# Patient Record
Sex: Male | Born: 1980 | Race: White | Hispanic: No | Marital: Single | Smoking: Current every day smoker
Health system: Southern US, Community
[De-identification: ages and names within clinical notes are randomized; demographics above are authoritative.]

## PROBLEM LIST (undated history)

## (undated) DIAGNOSIS — F909 Attention-deficit hyperactivity disorder, unspecified type: Secondary | ICD-10-CM

## (undated) HISTORY — PX: OTHER SURGICAL HISTORY: SHX169

---
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
## 2002-04-01 ENCOUNTER — Inpatient Hospital Stay (HOSPITAL_COMMUNITY): Admission: EM | Admit: 2002-04-01 | Discharge: 2002-04-02 | Payer: Self-pay | Admitting: Emergency Medicine

## 2002-04-02 ENCOUNTER — Inpatient Hospital Stay (HOSPITAL_COMMUNITY): Admission: EM | Admit: 2002-04-02 | Discharge: 2002-04-06 | Payer: Self-pay | Admitting: *Deleted

## 2002-06-25 ENCOUNTER — Emergency Department (HOSPITAL_COMMUNITY): Admission: EM | Admit: 2002-06-25 | Discharge: 2002-06-25 | Payer: Self-pay | Admitting: Emergency Medicine

## 2002-06-25 ENCOUNTER — Inpatient Hospital Stay (HOSPITAL_COMMUNITY): Admission: EM | Admit: 2002-06-25 | Discharge: 2002-06-29 | Payer: Self-pay | Admitting: Psychiatry

## 2003-05-10 ENCOUNTER — Emergency Department (HOSPITAL_COMMUNITY): Admission: EM | Admit: 2003-05-10 | Discharge: 2003-05-10 | Payer: Self-pay | Admitting: Emergency Medicine

## 2004-07-04 ENCOUNTER — Emergency Department (HOSPITAL_COMMUNITY): Admission: EM | Admit: 2004-07-04 | Discharge: 2004-07-04 | Payer: Self-pay | Admitting: Emergency Medicine

## 2005-04-04 ENCOUNTER — Emergency Department (HOSPITAL_COMMUNITY): Admission: EM | Admit: 2005-04-04 | Discharge: 2005-04-04 | Payer: Self-pay | Admitting: Emergency Medicine

## 2006-05-25 ENCOUNTER — Emergency Department (HOSPITAL_COMMUNITY): Admission: EM | Admit: 2006-05-25 | Discharge: 2006-05-25 | Payer: Self-pay | Admitting: Family Medicine

## 2006-05-31 ENCOUNTER — Emergency Department (HOSPITAL_COMMUNITY): Admission: EM | Admit: 2006-05-31 | Discharge: 2006-06-01 | Payer: Self-pay | Admitting: Emergency Medicine

## 2006-08-24 ENCOUNTER — Emergency Department (HOSPITAL_COMMUNITY): Admission: EM | Admit: 2006-08-24 | Discharge: 2006-08-24 | Payer: Self-pay | Admitting: Emergency Medicine

## 2007-12-25 ENCOUNTER — Emergency Department (HOSPITAL_COMMUNITY): Admission: EM | Admit: 2007-12-25 | Discharge: 2007-12-25 | Payer: Self-pay | Admitting: Emergency Medicine

## 2010-11-10 ENCOUNTER — Emergency Department (HOSPITAL_COMMUNITY): Admission: EM | Admit: 2010-11-10 | Discharge: 2010-11-10 | Payer: Self-pay | Admitting: Emergency Medicine

## 2010-11-22 ENCOUNTER — Emergency Department (HOSPITAL_COMMUNITY)
Admission: EM | Admit: 2010-11-22 | Discharge: 2010-11-22 | Payer: Self-pay | Source: Home / Self Care | Admitting: Family Medicine

## 2011-02-24 ENCOUNTER — Emergency Department (HOSPITAL_COMMUNITY): Payer: Self-pay

## 2011-02-24 ENCOUNTER — Emergency Department (HOSPITAL_COMMUNITY)
Admission: EM | Admit: 2011-02-24 | Discharge: 2011-02-24 | Disposition: A | Discharge status: Home / Self Care | Payer: Medicaid Other | Attending: Emergency Medicine | Admitting: Emergency Medicine

## 2011-02-24 ENCOUNTER — Emergency Department (HOSPITAL_COMMUNITY): Payer: Medicaid Other

## 2011-02-24 DIAGNOSIS — J45909 Unspecified asthma, uncomplicated: Secondary | ICD-10-CM | POA: Insufficient documentation

## 2011-02-24 DIAGNOSIS — Z79899 Other long term (current) drug therapy: Secondary | ICD-10-CM | POA: Insufficient documentation

## 2011-02-24 DIAGNOSIS — M25569 Pain in unspecified knee: Secondary | ICD-10-CM | POA: Insufficient documentation

## 2011-02-24 DIAGNOSIS — Y92009 Unspecified place in unspecified non-institutional (private) residence as the place of occurrence of the external cause: Secondary | ICD-10-CM | POA: Insufficient documentation

## 2011-02-24 DIAGNOSIS — W010XXA Fall on same level from slipping, tripping and stumbling without subsequent striking against object, initial encounter: Secondary | ICD-10-CM | POA: Insufficient documentation

## 2011-02-24 DIAGNOSIS — S8000XA Contusion of unspecified knee, initial encounter: Secondary | ICD-10-CM | POA: Insufficient documentation

## 2011-02-24 DIAGNOSIS — F3130 Bipolar disorder, current episode depressed, mild or moderate severity, unspecified: Secondary | ICD-10-CM | POA: Insufficient documentation
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
## 2011-07-16 ENCOUNTER — Emergency Department (HOSPITAL_COMMUNITY)
Admission: EM | Admit: 2011-07-16 | Discharge: 2011-07-16 | Disposition: A | Discharge status: Home / Self Care | Payer: Medicaid Other | Attending: Emergency Medicine | Admitting: Emergency Medicine

## 2011-07-16 ENCOUNTER — Encounter: Payer: Self-pay | Admitting: Emergency Medicine

## 2011-07-16 DIAGNOSIS — F17200 Nicotine dependence, unspecified, uncomplicated: Secondary | ICD-10-CM | POA: Insufficient documentation

## 2011-07-16 DIAGNOSIS — T07XXXA Unspecified multiple injuries, initial encounter: Secondary | ICD-10-CM

## 2011-07-16 DIAGNOSIS — IMO0002 Reserved for concepts with insufficient information to code with codable children: Secondary | ICD-10-CM | POA: Insufficient documentation

## 2011-07-16 DIAGNOSIS — W19XXXA Unspecified fall, initial encounter: Secondary | ICD-10-CM | POA: Insufficient documentation

## 2011-07-16 MED ORDER — IBUPROFEN 800 MG PO TABS
800.0000 mg | ORAL_TABLET | Freq: Once | ORAL | Status: AC
  Administered 2011-07-16: 800 mg via ORAL
  Filled 2011-07-16: qty 1

## 2011-07-16 NOTE — ED Provider Notes (Signed)
History     Chief Complaint  Patient presents with  . Fall   HPI  History reviewed. No pertinent past medical history.  History reviewed. No pertinent past surgical history.  History reviewed. No pertinent family history.  History  Substance Use Topics  . Smoking status: Passive Smoker  . Smokeless tobacco: Not on file  . Alcohol Use: Yes     occasional      Review of Systems  Skin: Positive for wound.       abrasions  Neurological: Negative for dizziness, seizures and numbness.  All other systems reviewed and are negative.    Physical Exam  BP 134/78  Pulse 66  Temp(Src) 98.5 F (36.9 C) (Oral)  Resp 20  Ht 5\' 10"  (1.778 m)  Wt 147 lb 4 oz (66.792 kg)  BMI 21.13 kg/m2  SpO2 100%  Physical Exam  Nursing note and vitals reviewed. Constitutional: He is oriented to person, place, and time. Vital signs are normal. He appears well-developed and well-nourished. No distress.  HENT:  Head: Normocephalic.  Right Ear: External ear normal.  Left Ear: External ear normal.  Nose: Nose normal.  Mouth/Throat: No oropharyngeal exudate.  Eyes: Conjunctivae and EOM are normal. Pupils are equal, round, and reactive to light. Right eye exhibits no discharge. Left eye exhibits no discharge. No scleral icterus.  Neck: Normal range of motion. Neck supple. No JVD present. No tracheal deviation present. No thyromegaly present.  Cardiovascular: Normal rate, regular rhythm, normal heart sounds, intact distal pulses and normal pulses.  Exam reveals no gallop and no friction rub.   No murmur heard. Pulmonary/Chest: Effort normal and breath sounds normal. No stridor. No respiratory distress. He has no wheezes. He has no rales. He exhibits no tenderness.  Abdominal: Soft. Normal appearance and bowel sounds are normal. He exhibits no distension and no mass. There is no tenderness. There is no rebound and no guarding.  Musculoskeletal: Normal range of motion. He exhibits no edema and no  tenderness.  Lymphadenopathy:    He has no cervical adenopathy.  Neurological: He is alert and oriented to person, place, and time. He has normal reflexes. No cranial nerve deficit. Coordination normal. GCS eye subscore is 4. GCS verbal subscore is 5. GCS motor subscore is 6.  Reflex Scores:      Tricep reflexes are 2+ on the right side and 2+ on the left side.      Bicep reflexes are 2+ on the right side and 2+ on the left side.      Brachioradialis reflexes are 2+ on the right side and 2+ on the left side.      Patellar reflexes are 2+ on the right side and 2+ on the left side.      Achilles reflexes are 2+ on the right side and 2+ on the left side. Skin: Skin is warm and dry. No rash noted. He is not diaphoretic.          abrasions  Psychiatric: He has a normal mood and affect. His speech is normal and behavior is normal. Judgment and thought content normal. Cognition and memory are normal.    ED Course  Procedures  MDM       Worthy Rancher, PA 07/16/11 2223  Worthy Rancher, PA 07/16/11 2224  Worthy Rancher, PA 07/16/11 2228
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
## 2011-07-16 NOTE — ED Notes (Signed)
Abrasions cleaned with sterile water. Gauze dressings applied.

## 2011-09-17 ENCOUNTER — Emergency Department (HOSPITAL_COMMUNITY): Payer: Medicaid Other

## 2011-09-17 ENCOUNTER — Emergency Department (HOSPITAL_COMMUNITY)
Admission: EM | Admit: 2011-09-17 | Discharge: 2011-09-17 | Disposition: A | Discharge status: Home / Self Care | Payer: Medicaid Other | Attending: Emergency Medicine | Admitting: Emergency Medicine

## 2011-09-17 ENCOUNTER — Encounter (HOSPITAL_COMMUNITY): Payer: Self-pay | Admitting: Emergency Medicine

## 2011-09-17 DIAGNOSIS — S022XXA Fracture of nasal bones, initial encounter for closed fracture: Secondary | ICD-10-CM | POA: Insufficient documentation

## 2011-09-17 DIAGNOSIS — Z79899 Other long term (current) drug therapy: Secondary | ICD-10-CM | POA: Insufficient documentation

## 2011-09-17 MED ORDER — IBUPROFEN 600 MG PO TABS: 600.0000 mg | ORAL_TABLET | Freq: Three times a day (TID) | ORAL | Status: AC | PRN

## 2011-09-17 MED ORDER — HYDROCODONE-ACETAMINOPHEN 5-325 MG PO TABS
2.0000 | ORAL_TABLET | Freq: Once | ORAL | Status: AC
  Administered 2011-09-17: 2 via ORAL
  Filled 2011-09-17: qty 2

## 2011-09-17 MED ORDER — HYDROCODONE-ACETAMINOPHEN 5-325 MG PO TABS: 2.0000 | ORAL_TABLET | ORAL | Status: AC | PRN

## 2011-09-17 MED ORDER — IBUPROFEN 400 MG PO TABS
600.0000 mg | ORAL_TABLET | Freq: Once | ORAL | Status: AC
  Administered 2011-09-17: 600 mg via ORAL
  Filled 2011-09-17: qty 2
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
## 2011-09-17 NOTE — ED Notes (Signed)
Blood cleaned from patient's face using sur clens.  No active bleeding.  No obvious s/s of distress noted.  Felisa Bonier, MD at the bedside for re-evaluation of the patient.
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
## 2011-09-17 NOTE — ED Notes (Signed)
Louis Bonier, MD at the bedside for evaluation of the patient.

## 2012-02-17 ENCOUNTER — Emergency Department (HOSPITAL_COMMUNITY)
Admission: EM | Admit: 2012-02-17 | Discharge: 2012-02-17 | Disposition: A | Discharge status: Home / Self Care | Payer: Medicaid Other | Attending: Emergency Medicine | Admitting: Emergency Medicine

## 2012-02-17 ENCOUNTER — Encounter (HOSPITAL_COMMUNITY): Payer: Self-pay

## 2012-02-17 DIAGNOSIS — J329 Chronic sinusitis, unspecified: Secondary | ICD-10-CM | POA: Insufficient documentation

## 2012-02-17 DIAGNOSIS — IMO0001 Reserved for inherently not codable concepts without codable children: Secondary | ICD-10-CM | POA: Insufficient documentation

## 2012-02-17 DIAGNOSIS — J069 Acute upper respiratory infection, unspecified: Secondary | ICD-10-CM | POA: Insufficient documentation

## 2012-02-17 DIAGNOSIS — R51 Headache: Secondary | ICD-10-CM | POA: Insufficient documentation

## 2012-02-17 MED ORDER — PSEUDOEPHEDRINE HCL 60 MG PO TABS
60.0000 mg | ORAL_TABLET | Freq: Once | ORAL | Status: AC
  Administered 2012-02-17: 60 mg via ORAL
  Filled 2012-02-17: qty 1

## 2012-02-17 MED ORDER — IBUPROFEN 800 MG PO TABS
800.0000 mg | ORAL_TABLET | Freq: Once | ORAL | Status: AC
  Administered 2012-02-17: 800 mg via ORAL
  Filled 2012-02-17: qty 1

## 2012-02-17 MED ORDER — PSEUDOEPHEDRINE HCL 60 MG PO TABS: ORAL_TABLET | ORAL | Status: DC

## 2012-02-17 MED ORDER — IBUPROFEN 800 MG PO TABS: 800.0000 mg | ORAL_TABLET | Freq: Three times a day (TID) | ORAL | Status: AC

## 2012-02-17 MED ORDER — PENICILLIN V POTASSIUM 250 MG PO TABS
500.0000 mg | ORAL_TABLET | Freq: Once | ORAL | Status: AC
  Administered 2012-02-17: 500 mg via ORAL
  Filled 2012-02-17: qty 2

## 2012-02-17 MED ORDER — AMOXICILLIN 500 MG PO CAPS: 500.0000 mg | ORAL_CAPSULE | Freq: Three times a day (TID) | ORAL | Status: AC

## 2012-02-17 NOTE — ED Provider Notes (Signed)
History     CSN: [id number]  Arrival date & time 02/17/12  2123   None     Chief Complaint  Patient presents with  . Nasal Congestion  . Cough  . Fever  . Chills    (Consider location/radiation/quality/duration/timing/severity/associated sxs/prior treatment) Patient is a 31 y.o. male presenting with cough and fever. The history is provided by the patient.  Cough This is a new problem. The current episode started 6 to 12 hours ago. The problem occurs constantly. The problem has been gradually worsening. The cough is non-productive. There has been no fever. Associated symptoms include chills, headaches, rhinorrhea and myalgias. Pertinent negatives include no chest pain, no shortness of breath and no wheezing. He is a smoker. His past medical history is significant for bronchitis.  Fever Primary symptoms of the febrile illness include fever, headaches, cough and myalgias. Primary symptoms do not include wheezing, shortness of breath, abdominal pain, dysuria or arthralgias.  The headache is not associated with photophobia.    Past Medical History  Diagnosis Date  . Asthma     History reviewed. No pertinent past surgical history.  No family history on file.  History  Substance Use Topics  . Smoking status: Current Some Day Smoker  . Smokeless tobacco: Not on file  . Alcohol Use: Yes     occasional      Review of Systems  Constitutional: Positive for fever and chills. Negative for activity change.       All ROS Neg except as noted in HPI  HENT: Positive for rhinorrhea. Negative for nosebleeds and neck pain.   Eyes: Negative for photophobia and discharge.  Respiratory: Positive for cough. Negative for shortness of breath and wheezing.   Cardiovascular: Negative for chest pain and palpitations.  Gastrointestinal: Negative for abdominal pain and blood in stool.  Genitourinary: Negative for dysuria, frequency and hematuria.  Musculoskeletal: Positive for myalgias. Negative  for back pain and arthralgias.  Skin: Negative.   Neurological: Positive for headaches. Negative for dizziness, seizures and speech difficulty.  Psychiatric/Behavioral: Negative for hallucinations and confusion.    Allergies  Bee  Home Medications   Current Outpatient Rx  Name Route Sig Dispense Refill  . ALBUTEROL IN Inhalation Inhale 3 puffs into the lungs once as needed. For shortness of breath     . DEPAKOTE PO Oral Take 1 tablet by mouth at bedtime.      Marland Kitchen PROZAC PO Oral Take 1 tablet by mouth daily at 8 pm.        BP 115/74  Pulse 64  Temp(Src) 97.6 F (36.4 C) (Oral)  Resp 20  Ht 5\' 10"  (1.778 m)  Wt 140 lb (63.504 kg)  BMI 20.09 kg/m2  SpO2 100%  Physical Exam  Nursing note and vitals reviewed. Constitutional: He is oriented to person, place, and time. He appears well-developed and well-nourished.  Non-toxic appearance.  HENT:  Head: Normocephalic.  Right Ear: Tympanic membrane and external ear normal.  Left Ear: Tympanic membrane and external ear normal.       Left nares sore to touch. Small ulcer of the mucosa of the left nares.Nasal congestion noted.  Eyes: EOM and lids are normal. Pupils are equal, round, and reactive to light.  Neck: Normal range of motion. Neck supple. Carotid bruit is not present.  Cardiovascular: Normal rate, regular rhythm, normal heart sounds, intact distal pulses and normal pulses.   Pulmonary/Chest: Breath sounds normal. No respiratory distress.       Few scattered rhonchi.  Abdominal: Soft. Bowel sounds are normal. There is no tenderness. There is no guarding.  Musculoskeletal: Normal range of motion.  Lymphadenopathy:       Head (right side): No submandibular adenopathy present.       Head (left side): No submandibular adenopathy present.    He has no cervical adenopathy.  Neurological: He is alert and oriented to person, place, and time. He has normal strength. No cranial nerve deficit or sensory deficit.  Skin: Skin is warm and  dry. No rash noted.  Psychiatric: He has a normal mood and affect. His speech is normal.    ED Course  Procedures (including critical care time)  Labs Reviewed - No data to display No results found.   Dx: 1. Sinusitis  2. URI   MDM  I have reviewed nursing notes, vital signs, and all appropriate lab and imaging results for this patient.  Rx for sudafed and amoxicillin given. Pt to increase fluids and wash hands frequently. Pt to see his primary MD if not improving, or return to the ED.      Kathie Dike, [place] 02/17/12 2159  Kathie Dike, PA 02/17/12 2207

## 2012-02-17 NOTE — Discharge Instructions (Signed)

## 2012-02-17 NOTE — ED Notes (Signed)
Pt presents with cough, nasal congestion, fever, and chills  X 2 hours.

## 2012-02-19 NOTE — ED Provider Notes (Signed)
Medical screening examination/treatment/procedure(s) were performed by non-physician practitioner and as supervising physician I was immediately available for consultation/collaboration.   Consuelo Suthers L Mirayah Wren, MD 02/19/12 1541 

## 2012-04-03 ENCOUNTER — Emergency Department (HOSPITAL_COMMUNITY)
Admission: EM | Admit: 2012-04-03 | Discharge: 2012-04-03 | Disposition: A | Discharge status: Home / Self Care | Payer: Medicaid Other | Attending: Emergency Medicine | Admitting: Emergency Medicine

## 2012-04-03 ENCOUNTER — Encounter (HOSPITAL_COMMUNITY): Payer: Self-pay | Admitting: *Deleted

## 2012-04-03 DIAGNOSIS — IMO0001 Reserved for inherently not codable concepts without codable children: Secondary | ICD-10-CM | POA: Insufficient documentation

## 2012-04-03 DIAGNOSIS — F17200 Nicotine dependence, unspecified, uncomplicated: Secondary | ICD-10-CM | POA: Insufficient documentation

## 2012-04-03 DIAGNOSIS — J45909 Unspecified asthma, uncomplicated: Secondary | ICD-10-CM | POA: Insufficient documentation

## 2012-04-03 DIAGNOSIS — M25539 Pain in unspecified wrist: Secondary | ICD-10-CM | POA: Insufficient documentation

## 2012-04-03 DIAGNOSIS — S60862A Insect bite (nonvenomous) of left wrist, initial encounter: Secondary | ICD-10-CM

## 2012-04-03 MED ORDER — DOUBLE ANTIBIOTIC 500-10000 UNIT/GM EX OINT
TOPICAL_OINTMENT | Freq: Once | CUTANEOUS | Status: AC
  Administered 2012-04-03: 1 via TOPICAL
  Filled 2012-04-03: qty 1

## 2012-04-03 MED ORDER — IBUPROFEN 800 MG PO TABS
800.0000 mg | ORAL_TABLET | Freq: Once | ORAL | Status: AC
  Administered 2012-04-03: 800 mg via ORAL

## 2012-04-03 MED ORDER — IBUPROFEN 800 MG PO TABS
ORAL_TABLET | ORAL | Status: AC
  Filled 2012-04-03: qty 1

## 2012-04-03 MED ORDER — BACITRACIN ZINC 500 UNIT/GM EX OINT
TOPICAL_OINTMENT | CUTANEOUS | Status: AC
  Filled 2012-04-03: qty 0.9

## 2012-04-03 NOTE — ED Provider Notes (Signed)
History     CSN: [id number]  Arrival date & time 04/03/12  1334   First MD Initiated Contact with Patient 04/03/12 1519      Chief Complaint  Patient presents with  . Insect Bite    (Consider location/radiation/quality/duration/timing/severity/associated sxs/prior treatment) HPI Comments: Awakened this AM with L wrist area pain.  "i think a spider bit me".  The history is provided by the patient. No language interpreter was used.    Past Medical History  Diagnosis Date  . Asthma     History reviewed. No pertinent past surgical history.  No family history on file.  History  Substance Use Topics  . Smoking status: Current Some Day Smoker  . Smokeless tobacco: Not on file  . Alcohol Use: Yes     occasional      Review of Systems  Constitutional: Negative for fever and chills.  Skin: Negative for wound.       Insect bite  Neurological: Negative for light-headedness.  All other systems reviewed and are negative.    Allergies  Bee  Home Medications  No current outpatient prescriptions on file.  BP 128/73  Pulse 107  Temp(Src) 98 F (36.7 C) (Oral)  Resp 17  Ht 5\' 10"  (1.778 m)  Wt 140 lb (63.504 kg)  BMI 20.09 kg/m2  SpO2 99%  Physical Exam  Nursing note and vitals reviewed. Constitutional: He is oriented to person, place, and time. He appears well-developed and well-nourished.  HENT:  Head: Normocephalic and atraumatic.  Eyes: EOM are normal.  Neck: Normal range of motion.  Cardiovascular: Normal rate, regular rhythm, normal heart sounds and intact distal pulses.   Pulmonary/Chest: Effort normal and breath sounds normal. No respiratory distress.  Abdominal: Soft. He exhibits no distension. There is no tenderness.  Musculoskeletal: Normal range of motion. He exhibits tenderness.       ~ 0.5 cm diam area of redness and mild PT in area of L ulnar styloid.  Skin intact.  No streaking.  No epicondylar LA  Neurological: He is alert and oriented to  person, place, and time.  Skin: Skin is warm and dry.  Psychiatric: He has a normal mood and affect. Judgment normal.    ED Course  Procedures (including critical care time)  Labs Reviewed - No data to display No results found.   1. Insect bite of left wrist       MDM  No s/s of infection Take ibuprofen and benadryl.  Return if any s/s of infection         Worthy Rancher, [place] 04/03/12 1540

## 2012-04-03 NOTE — Discharge Instructions (Signed)
Insect Bite Mosquitoes, flies, fleas, bedbugs, and many other insects can bite. Insect bites are different from insect stings. A sting is when venom is injected into the skin. Some insect bites can transmit infectious diseases. SYMPTOMS  Insect bites usually turn red, swell, and itch for 2 to 4 days. They often go away on their own. TREATMENT  Your caregiver may prescribe antibiotic medicines if a bacterial infection develops in the bite. HOME CARE INSTRUCTIONS  Do not scratch the bite area.   Keep the bite area clean and dry. Wash the bite area thoroughly with soap and water.   Put ice or cool compresses on the bite area.   Put ice in a plastic bag.   Place a towel between your skin and the bag.   Leave the ice on for 20 minutes, 4 times a day for the first 2 to 3 days, or as directed.   You may apply a baking soda paste, cortisone cream, or calamine lotion to the bite area as directed by your caregiver. This can help reduce itching and swelling.   Only take over-the-counter or prescription medicines as directed by your caregiver.   If you are given antibiotics, take them as directed. Finish them even if you start to feel better.  You may need a tetanus shot if:  You cannot remember when you had your last tetanus shot.   You have never had a tetanus shot.   The injury broke your skin.  If you get a tetanus shot, your arm may swell, get red, and feel warm to the touch. This is common and not a problem. If you need a tetanus shot and you choose not to have one, there is a rare chance of getting tetanus. Sickness from tetanus can be serious. SEEK IMMEDIATE MEDICAL CARE IF:   You have increased pain, redness, or swelling in the bite area.   You see a red line on the skin coming from the bite.   You have a fever.   You have joint pain.   You have a headache or neck pain.   You have unusual weakness.   You have a rash.   You have chest pain or shortness of breath.   You  have abdominal pain, nausea, or vomiting.   You feel unusually tired or sleepy.  MAKE SURE YOU:   Understand these instructions.   Will watch your condition.   Will get help right away if you are not doing well or get worse.  Document Released: 01/10/2005 Document Revised: 11/22/2011 Document Reviewed: 07/04/2011 [facility] Patient Information 2012 [place], [place].   The area on your L wrist is consistent with an insect bite.  It is not presently infected.  Wash the area twice daily with soap and water then apply antibiotic ointment and a bandaid.  Take benadryl if needed for itching and ibuprofen for discomfort.  Return if you see any signs of infection.

## 2012-04-03 NOTE — ED Notes (Signed)
Pain in left wrist area, thinks he has a spider bite

## 2012-04-03 NOTE — ED Provider Notes (Signed)
Medical screening examination/treatment/procedure(s) were performed by non-physician practitioner and as supervising physician I was immediately available for consultation/collaboration. Devoria Albe, MD, Armando Gang   Ward Givens, MD 04/03/12 2125

## 2012-04-25 ENCOUNTER — Emergency Department (HOSPITAL_COMMUNITY)
Admission: EM | Admit: 2012-04-25 | Discharge: 2012-04-25 | Disposition: A | Discharge status: Home / Self Care | Payer: Medicaid Other | Attending: Emergency Medicine | Admitting: Emergency Medicine

## 2012-04-25 ENCOUNTER — Encounter (HOSPITAL_COMMUNITY): Payer: Self-pay | Admitting: *Deleted

## 2012-04-25 DIAGNOSIS — J45909 Unspecified asthma, uncomplicated: Secondary | ICD-10-CM | POA: Insufficient documentation

## 2012-04-25 DIAGNOSIS — S60469A Insect bite (nonvenomous) of unspecified finger, initial encounter: Secondary | ICD-10-CM | POA: Insufficient documentation

## 2012-04-25 DIAGNOSIS — W57XXXA Bitten or stung by nonvenomous insect and other nonvenomous arthropods, initial encounter: Secondary | ICD-10-CM | POA: Insufficient documentation

## 2012-04-25 NOTE — ED Provider Notes (Signed)
History   This chart was scribed for Donnetta Hutching, MD by Shari Heritage. The patient was seen in room APFT24/APFT24. Patient's care was started at 1442.     CSN: [id number]  Arrival date & time 04/25/12  1442   First MD Initiated Contact with Patient 04/25/12 1512      Chief Complaint  Patient presents with  . Insect Bite    (Consider location/radiation/quality/duration/timing/severity/associated sxs/prior treatment) The history is provided by the patient. No language interpreter was used.   [name] is a 31 y.o. male who presents to the Emergency Department complaining of mild insect bite located on his left index finger onset yesterday afternoon with no associated symptoms or modifying factors. Patient reports that he was sitting on patio when he believes he was bitten by a spider. Patient did not report using any relieving factors to address the insect bite. Patient denies any pain in his index finger. Patient is a current smoker with h/o of asthma.  Past Medical History  Diagnosis Date  . Asthma     History reviewed. No pertinent past surgical history.  History reviewed. No pertinent family history.  History  Substance Use Topics  . Smoking status: Current Some Day Smoker  . Smokeless tobacco: Not on file  . Alcohol Use: Yes     occasional      Review of Systems A complete 10 system review of systems was obtained and all systems are negative except as noted in the HPI and PMH.   Allergies  Nutritional supplements  Home Medications  No current outpatient prescriptions on file.  BP 137/83  Pulse 60  Temp(Src) 98.3 F (36.8 C) (Oral)  Resp 18  Ht 5\' 11"  (1.803 m)  Wt 145 lb (65.772 kg)  BMI 20.22 kg/m2  SpO2 100%  Physical Exam  Constitutional: He is oriented to person, place, and time. He appears well-developed and well-nourished.  HENT:  Head: Normocephalic.  Musculoskeletal: Normal range of motion.  Neurological: He is alert and oriented to  person, place, and time.  Skin:       small bumps on the dorsal aspect of the left second distal phalanx   Psychiatric: He has a normal mood and affect.    ED Course  Procedures (including critical care time) DIAGNOSTIC STUDIES: Oxygen Saturation is 100% on room air, normal by my interpretation.    COORDINATION OF CARE: 3:26PM- Recommended that patient wash his phalanx thoroughly, but insect bites do not appear to have caused critical injury. Patient will be discharged.   Labs Reviewed - No data to display No results found.   No diagnosis found.    MDM  No clinical evidence of infection. Suspect minor insect bite  I personally performed the services described in this documentation, which was scribed in my presence. The recorded information has been reviewed and considered.         Donnetta Hutching, MD 04/25/12 1536

## 2012-04-25 NOTE — Discharge Instructions (Signed)
Keep clean, elevate, Tylenol or Motrin for pain, return if worse in any way

## 2012-04-25 NOTE — ED Notes (Signed)
Pt presents with a questionable spider bite on left index finger per pt.  Pt has a miniscule red spot on left index finger with no redness or swelling noted. Pt was transported to ED by EMS. Pt educated on the use of EMS and the importance of using said service with responsibility.

## 2012-04-25 NOTE — ED Notes (Signed)
Pt thinks he was bitten by a spider to lt index finger.  No redness or swelling present.

## 2012-08-31 ENCOUNTER — Emergency Department (HOSPITAL_COMMUNITY)
Admission: EM | Admit: 2012-08-31 | Discharge: 2012-08-31 | Disposition: A | Discharge status: Home / Self Care | Payer: Medicaid Other | Attending: Emergency Medicine | Admitting: Emergency Medicine

## 2012-08-31 ENCOUNTER — Encounter (HOSPITAL_COMMUNITY): Payer: Self-pay | Admitting: Emergency Medicine

## 2012-08-31 DIAGNOSIS — K089 Disorder of teeth and supporting structures, unspecified: Secondary | ICD-10-CM | POA: Insufficient documentation

## 2012-08-31 DIAGNOSIS — K0889 Other specified disorders of teeth and supporting structures: Secondary | ICD-10-CM

## 2012-08-31 DIAGNOSIS — J40 Bronchitis, not specified as acute or chronic: Secondary | ICD-10-CM | POA: Insufficient documentation

## 2012-08-31 DIAGNOSIS — F17200 Nicotine dependence, unspecified, uncomplicated: Secondary | ICD-10-CM | POA: Insufficient documentation

## 2012-08-31 DIAGNOSIS — Z9109 Other allergy status, other than to drugs and biological substances: Secondary | ICD-10-CM | POA: Insufficient documentation

## 2012-08-31 DIAGNOSIS — J45909 Unspecified asthma, uncomplicated: Secondary | ICD-10-CM | POA: Insufficient documentation

## 2012-08-31 DIAGNOSIS — Z91038 Other insect allergy status: Secondary | ICD-10-CM | POA: Insufficient documentation

## 2012-08-31 MED ORDER — AMOXICILLIN 250 MG PO CAPS: 250.0000 mg | ORAL_CAPSULE | Freq: Three times a day (TID) | ORAL | Status: AC

## 2012-08-31 MED ORDER — GUAIFENESIN-CODEINE 100-10 MG/5ML PO SYRP: 5.0000 mL | ORAL_SOLUTION | Freq: Three times a day (TID) | ORAL | Status: AC | PRN

## 2012-08-31 NOTE — ED Notes (Signed)
Pt c/o cold sx, ha, swollen gums, productive cough with green sputum x 3 days. nad noted.

## 2012-08-31 NOTE — ED Provider Notes (Signed)
History     CSN: [id number]  Arrival date & time 08/31/12  0750   First MD Initiated Contact with Patient 08/31/12 [phone]      Chief Complaint  Patient presents with  . Nasal Congestion  . Headache    HPI [name] is a 31 y.o. male who presents to the ED with congestion and headache. The congestion started 3 days ago. Associated symptoms include productive cough with green sputum. Feeling tired.  Today mouth pain with gum swelling. Hx of bronchitis, patient smokes. Denies nausea, vomiting or other problems. The history was provided by the patient.  Past Medical History  Diagnosis Date  . Asthma     Past Surgical History  Procedure Date  . Head surgery (as a baby)     History reviewed. No pertinent family history.  History  Substance Use Topics  . Smoking status: Current Some Day Smoker  . Smokeless tobacco: Not on file  . Alcohol Use: Yes     occasional      Review of Systems  Constitutional: Positive for chills. Negative for fever, diaphoresis and fatigue.  HENT: Positive for congestion, sore throat and dental problem. Negative for ear pain, facial swelling, neck pain, neck stiffness and sinus pressure.   Eyes: Negative for photophobia, pain and discharge.  Respiratory: Positive for cough and shortness of breath. Negative for chest tightness and wheezing.   Cardiovascular: Negative for chest pain and palpitations.  Gastrointestinal: Negative for nausea, vomiting, abdominal pain, diarrhea, constipation and abdominal distention.  Genitourinary: Negative for dysuria, frequency, flank pain and difficulty urinating.  Musculoskeletal: Positive for myalgias. Negative for back pain and gait problem.  Skin: Negative for color change and rash.  Neurological: Positive for headaches. Negative for dizziness, speech difficulty, weakness, light-headedness and numbness.  Psychiatric/Behavioral: Negative for confusion and agitation.    Allergies  Bee venom and Nutritional  supplements  Home Medications  No current outpatient prescriptions on file.  BP 128/81  Pulse 66  Temp 98.1 F (36.7 C)  Resp 18  Ht 5\' 11"  (1.803 m)  Wt 140 lb (63.504 kg)  BMI 19.53 kg/m2  SpO2 100%  Physical Exam  Nursing note and vitals reviewed. Constitutional: He is oriented to person, place, and time. He appears well-developed and well-nourished. No distress.  HENT:  Head: Normocephalic and atraumatic.  Right Ear: Tympanic membrane, external ear and ear canal normal.  Left Ear: Tympanic membrane, external ear and ear canal normal.  Nose: Mucosal edema and rhinorrhea present.  Mouth/Throat: Uvula is midline, oropharynx is clear and moist and mucous membranes are normal.       Minimal swelling and erythema of gum over upper front teeth. Tender.   Eyes: EOM are normal. Pupils are equal, round, and reactive to light.  Neck: Normal range of motion. Neck supple.  Cardiovascular: Normal rate and regular rhythm.   Pulmonary/Chest: Effort normal. No respiratory distress. He has no wheezes.  Abdominal: Soft. There is no tenderness.  Musculoskeletal: Normal range of motion. He exhibits no edema.  Lymphadenopathy:    He has no cervical adenopathy.  Neurological: He is alert and oriented to person, place, and time. No cranial nerve deficit.  Skin: Skin is warm and dry.  Psychiatric: He has a normal mood and affect. His behavior is normal. Judgment and thought content normal.   Assessment: Bronchitis   Dental pain  Plan:  Robitussin AC   Amoxil   Follow up with PCP and with dentist Discussed with the patient and  all questioned fully answered. He will follow up with PCP or return here  if any problems arise.   Medication List     As of 08/31/2012  8:26 AM    START taking these medications         amoxicillin 250 MG capsule   Commonly known as: AMOXIL   Take 1 capsule (250 mg total) by mouth 3 (three) times daily.      guaiFENesin-codeine 100-10 MG/5ML syrup   Commonly  known as: ROBITUSSIN AC   Take 5 mLs by mouth 3 (three) times daily as needed for cough.          Where to get your medications    These are the prescriptions that you need to pick up.   You may get these medications from any pharmacy.         amoxicillin 250 MG capsule   guaiFENesin-codeine 100-10 MG/5ML syrup           Procedures    Janne Napoleon, NP 08/31/12 [phone]

## 2012-09-02 NOTE — ED Provider Notes (Signed)
Medical screening examination/treatment/procedure(s) were performed by non-physician practitioner and as supervising physician I was immediately available for consultation/collaboration.  Trueman Worlds, MD 09/02/12 1509 

## 2013-02-25 ENCOUNTER — Encounter (HOSPITAL_COMMUNITY): Payer: Self-pay

## 2013-02-25 ENCOUNTER — Emergency Department (HOSPITAL_COMMUNITY)
Admission: EM | Admit: 2013-02-25 | Discharge: 2013-02-25 | Disposition: A | Discharge status: Home / Self Care | Payer: Medicaid Other | Attending: Emergency Medicine | Admitting: Emergency Medicine

## 2013-02-25 DIAGNOSIS — R109 Unspecified abdominal pain: Secondary | ICD-10-CM | POA: Insufficient documentation

## 2013-02-25 DIAGNOSIS — J45909 Unspecified asthma, uncomplicated: Secondary | ICD-10-CM | POA: Insufficient documentation

## 2013-02-25 DIAGNOSIS — R252 Cramp and spasm: Secondary | ICD-10-CM | POA: Insufficient documentation

## 2013-02-25 DIAGNOSIS — F17200 Nicotine dependence, unspecified, uncomplicated: Secondary | ICD-10-CM | POA: Insufficient documentation

## 2013-02-25 DIAGNOSIS — E876 Hypokalemia: Secondary | ICD-10-CM | POA: Insufficient documentation

## 2013-02-25 DIAGNOSIS — R112 Nausea with vomiting, unspecified: Secondary | ICD-10-CM | POA: Insufficient documentation

## 2013-02-25 DIAGNOSIS — Z79899 Other long term (current) drug therapy: Secondary | ICD-10-CM | POA: Insufficient documentation

## 2013-02-25 DIAGNOSIS — R000 Tachycardia, unspecified: Secondary | ICD-10-CM | POA: Insufficient documentation

## 2013-02-25 DIAGNOSIS — E860 Dehydration: Secondary | ICD-10-CM | POA: Insufficient documentation

## 2013-02-25 DIAGNOSIS — R197 Diarrhea, unspecified: Secondary | ICD-10-CM | POA: Insufficient documentation

## 2013-02-25 LAB — CBC WITH DIFFERENTIAL/PLATELET
Basophils Relative: 0 % (ref 0–1)
HCT: 44.4 % (ref 39.0–52.0)
Hemoglobin: 15.3 g/dL (ref 13.0–17.0)
Lymphs Abs: 0.5 10*3/uL — ABNORMAL LOW (ref 0.7–4.0)
MCH: 28.4 pg (ref 26.0–34.0)
MCHC: 34.5 g/dL (ref 30.0–36.0)
Monocytes Absolute: 0.4 10*3/uL (ref 0.1–1.0)
Monocytes Relative: 6 % (ref 3–12)
Neutro Abs: 5.1 10*3/uL (ref 1.7–7.7)
RBC: 5.39 MIL/uL (ref 4.22–5.81)

## 2013-02-25 LAB — URINALYSIS, ROUTINE W REFLEX MICROSCOPIC
Bilirubin Urine: NEGATIVE
Glucose, UA: NEGATIVE mg/dL
Hgb urine dipstick: NEGATIVE
Specific Gravity, Urine: 1.025 (ref 1.005–1.030)
pH: 5.5 (ref 5.0–8.0)

## 2013-02-25 LAB — BASIC METABOLIC PANEL
BUN: 15 mg/dL (ref 6–23)
CO2: 24 mEq/L (ref 19–32)
Chloride: 102 mEq/L (ref 96–112)
Creatinine, Ser: 0.87 mg/dL (ref 0.50–1.35)
GFR calc Af Amer: >90 mL/min (ref >90)
Glucose, Bld: 107 mg/dL — ABNORMAL HIGH (ref 70–99)

## 2013-02-25 MED ORDER — PROMETHAZINE HCL 25 MG/ML IJ SOLN
25.0000 mg | Freq: Once | INTRAMUSCULAR | Status: AC
  Administered 2013-02-25: 25 mg via INTRAMUSCULAR
  Filled 2013-02-25: qty 1

## 2013-02-25 MED ORDER — SODIUM CHLORIDE 0.9 % IV BOLUS (SEPSIS)
1000.0000 mL | Freq: Once | INTRAVENOUS | Status: AC
  Administered 2013-02-25: 1000 mL via INTRAVENOUS

## 2013-02-25 MED ORDER — POTASSIUM CHLORIDE CRYS ER 20 MEQ PO TBCR
40.0000 meq | EXTENDED_RELEASE_TABLET | Freq: Once | ORAL | Status: AC
  Administered 2013-02-25: 40 meq via ORAL
  Filled 2013-02-25: qty 2

## 2013-02-25 MED ORDER — SODIUM CHLORIDE 0.9 % IV BOLUS (SEPSIS): 1000.0000 mL | Freq: Once | INTRAVENOUS | Status: DC

## 2013-02-25 MED ORDER — NAPROXEN 500 MG PO TABS: 500.0000 mg | ORAL_TABLET | Freq: Two times a day (BID) | ORAL | Status: DC

## 2013-02-25 MED ORDER — SODIUM CHLORIDE 0.9 % IV SOLN
1000.0000 mL | Freq: Once | INTRAVENOUS | Status: AC
  Administered 2013-02-25: 1000 mL via INTRAVENOUS

## 2013-02-25 MED ORDER — ONDANSETRON HCL 4 MG/2ML IJ SOLN
4.0000 mg | Freq: Once | INTRAMUSCULAR | Status: AC
  Administered 2013-02-25: 4 mg via INTRAVENOUS
  Filled 2013-02-25: qty 2

## 2013-02-25 MED ORDER — ONDANSETRON 4 MG PO TBDP: 4.0000 mg | ORAL_TABLET | Freq: Three times a day (TID) | ORAL | Status: DC | PRN

## 2013-02-25 NOTE — ED Provider Notes (Signed)
History     This chart was scribed for Vida Roller, MD, MD by Smitty Pluck, ED Scribe. The patient was seen in room APA04/APA04 and the patient's care was started at 1:24 PM.   CSN: [id number]  Arrival date & time 02/25/13  1252      Chief Complaint  Patient presents with  . Abdominal Pain  . Emesis  . Diarrhea     The history is provided by the patient and medical records. No language interpreter was used.   [name] is a 32 y.o. male who presents to the Emergency Department complaining of emesis onset 1 day ago. Pt reports that he has vomited multiple times. He reports that he had blood in vomit but the color of his vomit turned to yellow as symptoms progressed. He states today 8 hours ago he started having watery diarrhea. He reports moderate nausea and not being able to tolerate food and liquids without vomiting. Pt denies hx of abdominal surgeries. He reports that he had pork and noodles last night for dinner with his roommates which did not have a similar symptoms. Denies sick contact. Pt reports he has cramping in his hands and feet. Pt denies blood in stool, fever, chills, weakness, cough, SOB and any other pain.   He drinks alcohol occasionally. Denies using illegal drugs.     Past Medical History  Diagnosis Date  . Asthma     Past Surgical History  Procedure Laterality Date  . Head surgery (as a baby)      No family history on file.  History  Substance Use Topics  . Smoking status: Current Some Day Smoker  . Smokeless tobacco: Not on file  . Alcohol Use: Yes     Comment: occasional      Review of Systems  Constitutional: Negative for fever and chills.  Gastrointestinal: Positive for nausea, vomiting and diarrhea.  All other systems reviewed and are negative.    Allergies  Bee venom  Home Medications   Current Outpatient Rx  Name  Route  Sig  Dispense  Refill  . guaiFENesin (MUCINEX) 600 MG 12 hr tablet   Oral   Take 600 mg by mouth at  bedtime. For cold symptoms         . naproxen (NAPROSYN) 500 MG tablet   Oral   Take 1 tablet (500 mg total) by mouth 2 (two) times daily with a meal.   30 tablet   0   . ondansetron (ZOFRAN ODT) 4 MG disintegrating tablet   Oral   Take 1 tablet (4 mg total) by mouth every 8 (eight) hours as needed for nausea.   10 tablet   0     BP 132/78  Pulse 114  Temp(Src) 99.4 F (37.4 C) (Oral)  Resp 18  Ht 5\' 11"  (1.803 m)  Wt 140 lb (63.504 kg)  BMI 19.53 kg/m2  SpO2 98%  Physical Exam  Nursing note and vitals reviewed. Constitutional: He is oriented to person, place, and time. He appears well-developed and well-nourished. No distress.  HENT:  Head: Normocephalic and atraumatic.  Mouth/Throat: Mucous membranes are dry.  Bad breath   Eyes: Conjunctivae are normal. Pupils are equal, round, and reactive to light.  Neck: Normal range of motion. Neck supple.  Cardiovascular: Normal rate, regular rhythm and normal heart sounds.   Pulmonary/Chest: Effort normal and breath sounds normal. No respiratory distress. He has no wheezes. He has no rales.  Abdominal: Soft. He exhibits no  distension. There is no tenderness. There is no rebound, no guarding and no CVA tenderness.  Neurological: He is alert and oriented to person, place, and time.  Skin: Skin is warm and dry.  Psychiatric: He has a normal mood and affect. His behavior is normal.    ED Course  Procedures (including critical care time) DIAGNOSTIC STUDIES: Oxygen Saturation is 98% on room air, normal by my interpretation.    COORDINATION OF CARE: 1:27 PM Discussed ED treatment with pt and pt agrees.  1:33 PM Ordered:  Medications  0.9 %  sodium chloride infusion (0 mLs Intravenous Stopped 02/25/13 1613)  ondansetron (ZOFRAN) injection 4 mg (4 mg Intravenous Given 02/25/13 1342)  sodium chloride 0.9 % bolus 1,000 mL (0 mLs Intravenous Stopped 02/25/13 1446)  potassium chloride SA (K-DUR,KLOR-CON) CR tablet 40 mEq (40 mEq Oral  Given 02/25/13 1444)  promethazine (PHENERGAN) injection 25 mg (25 mg Intramuscular Given 02/25/13 1516)       Labs Reviewed  CBC WITH DIFFERENTIAL - Abnormal; Notable for the following:    Neutrophils Relative 86 (*)    Lymphocytes Relative 8 (*)    Lymphs Abs 0.5 (*)    All other components within normal limits  BASIC METABOLIC PANEL - Abnormal; Notable for the following:    Potassium 3.0 (*)    Glucose, Bld 107 (*)    All other components within normal limits  URINALYSIS, ROUTINE W REFLEX MICROSCOPIC - Abnormal; Notable for the following:    Ketones, ur TRACE (*)    All other components within normal limits  LIPASE, BLOOD   No results found.   1. Nausea and vomiting   2. Dehydration   3. Hypokalemia       MDM  The patient has initially had a tachycardia which improved with IV fluids, his temperature remained between 99 and 100, his oxygen level may normal and he was not hypotensive. Laboratory evaluation showed a higher specific gravity and mild ketonuria, mild hypokalemia and no leukocytosis or elevated lipase.  He was given 2 L of IV fluid, 2 antiemetics, and potassium supplementation with good improvement. The patient was ambulatory at discharge without complaint.  I suspect that the patient has a gastrointestinal disorder which is possibly viral in etiology but I doubt a more significant etiology given the patient's benign exam, benign laboratory workup and improvement with interventions as above.    I personally performed the services described in this documentation, which was scribed in my presence. The recorded information has been reviewed and is accurate.         Vida Roller, MD 02/25/13 2010

## 2013-02-25 NOTE — ED Notes (Signed)
Pt reports was at a friend's house last night  and started having a headache, later started having n/v/d.    C/O abd pain and cramping in hands and feet.

## 2013-02-25 NOTE — ED Notes (Signed)
Patient states he can not give a urine sample at this time but might could if given something to drink. Patient states he can not have water though because it messes with his stomach. RN made aware.

## 2013-02-25 NOTE — ED Notes (Signed)
Patient given ginger ale per MD approval.

## 2013-02-25 NOTE — ED Notes (Signed)
Patient would like nausea medicine at this time.

## 2013-11-09 ENCOUNTER — Encounter (HOSPITAL_COMMUNITY): Payer: Self-pay | Admitting: Emergency Medicine

## 2013-11-09 ENCOUNTER — Emergency Department (HOSPITAL_COMMUNITY)
Admission: EM | Admit: 2013-11-09 | Discharge: 2013-11-09 | Disposition: A | Discharge status: Home / Self Care | Payer: Medicaid Other | Attending: Emergency Medicine | Admitting: Emergency Medicine

## 2013-11-09 DIAGNOSIS — J029 Acute pharyngitis, unspecified: Secondary | ICD-10-CM | POA: Insufficient documentation

## 2013-11-09 DIAGNOSIS — J069 Acute upper respiratory infection, unspecified: Secondary | ICD-10-CM

## 2013-11-09 DIAGNOSIS — Z9889 Other specified postprocedural states: Secondary | ICD-10-CM | POA: Insufficient documentation

## 2013-11-09 DIAGNOSIS — F17200 Nicotine dependence, unspecified, uncomplicated: Secondary | ICD-10-CM | POA: Insufficient documentation

## 2013-11-09 DIAGNOSIS — J45909 Unspecified asthma, uncomplicated: Secondary | ICD-10-CM | POA: Insufficient documentation

## 2013-11-09 DIAGNOSIS — R6883 Chills (without fever): Secondary | ICD-10-CM | POA: Insufficient documentation

## 2013-11-09 DIAGNOSIS — Z8659 Personal history of other mental and behavioral disorders: Secondary | ICD-10-CM | POA: Insufficient documentation

## 2013-11-09 DIAGNOSIS — IMO0001 Reserved for inherently not codable concepts without codable children: Secondary | ICD-10-CM | POA: Insufficient documentation

## 2013-11-09 HISTORY — DX: Attention-deficit hyperactivity disorder, unspecified type: F90.9

## 2013-11-09 MED ORDER — IBUPROFEN 800 MG PO TABS
800.0000 mg | ORAL_TABLET | Freq: Once | ORAL | Status: AC
  Administered 2013-11-09: 800 mg via ORAL
  Filled 2013-11-09: qty 1

## 2013-11-09 MED ORDER — OXYMETAZOLINE HCL 0.05 % NA SOLN
1.0000 | Freq: Once | NASAL | Status: AC
  Administered 2013-11-09: 1 via NASAL
  Filled 2013-11-09: qty 15

## 2013-11-09 MED ORDER — PREDNISONE 10 MG PO TABS: ORAL_TABLET | ORAL | Status: DC

## 2013-11-09 MED ORDER — PREDNISONE 50 MG PO TABS
60.0000 mg | ORAL_TABLET | Freq: Once | ORAL | Status: AC
  Administered 2013-11-09: 22:00:00 60 mg via ORAL
  Filled 2013-11-09 (×2): qty 1

## 2013-11-09 NOTE — ED Provider Notes (Signed)
CSN: [id number]     Arrival date & time 11/09/13  1927 History   First MD Initiated Contact with Patient 11/09/13 2013     Chief Complaint  Patient presents with  . Cough   (Consider location/radiation/quality/duration/timing/severity/associated sxs/prior Treatment) Patient is a 32 y.o. male presenting with cough. The history is provided by the patient.  Cough Cough characteristics:  Non-productive Severity:  Moderate Onset quality:  Gradual Duration:  4 weeks Timing:  Intermittent Progression:  Worsening Chronicity:  New Smoker: yes   Context: sick contacts and weather changes   Relieved by:  Nothing Ineffective treatments:  None tried Associated symptoms: chills, myalgias, rhinorrhea, sinus congestion and sore throat   Associated symptoms: no chest pain, no eye discharge, no fever, no shortness of breath and no wheezing     Past Medical History  Diagnosis Date  . Asthma   . ADHD (attention deficit hyperactivity disorder)    Past Surgical History  Procedure Laterality Date  . Head surgery (as a baby)     History reviewed. No pertinent family history. History  Substance Use Topics  . Smoking status: Current Some Day Smoker  . Smokeless tobacco: Not on file  . Alcohol Use: Yes     Comment: occasional    Review of Systems  Constitutional: Positive for chills. Negative for fever and activity change.       All ROS Neg except as noted in HPI  HENT: Positive for rhinorrhea and sore throat. Negative for nosebleeds.   Eyes: Negative for photophobia and discharge.  Respiratory: Positive for cough. Negative for shortness of breath and wheezing.   Cardiovascular: Negative for chest pain and palpitations.  Gastrointestinal: Negative for abdominal pain and blood in stool.  Genitourinary: Negative for dysuria, frequency and hematuria.  Musculoskeletal: Positive for myalgias. Negative for arthralgias, back pain and neck pain.  Skin: Negative.   Neurological: Negative for  dizziness, seizures and speech difficulty.  Psychiatric/Behavioral: Negative for hallucinations and confusion.    Allergies  Bee venom  Home Medications   Current Outpatient Rx  Name  Route  Sig  Dispense  Refill  . dextromethorphan (DELSYM) 30 MG/5ML liquid   Oral   Take by mouth as needed for cough.         Marland Kitchen guaiFENesin (MUCINEX) 600 MG 12 hr tablet   Oral   Take 600 mg by mouth daily as needed for cough or to loosen phlegm.          BP 153/75  Pulse 98  Temp(Src) 98.8 F (37.1 C) (Oral)  Resp 20  Ht 5\' 10"  (1.778 m)  Wt 184 lb (83.462 kg)  BMI 26.40 kg/m2  SpO2 100% Physical Exam  Nursing note and vitals reviewed. Constitutional: He is oriented to person, place, and time. He appears well-developed and well-nourished.  Non-toxic appearance.  HENT:  Head: Normocephalic.  Right Ear: Tympanic membrane and external ear normal.  Left Ear: Tympanic membrane and external ear normal.  Nasal congestion present. Mild increase redness of the posterior pharynx.  Eyes: EOM and lids are normal. Pupils are equal, round, and reactive to light.  Neck: Normal range of motion. Neck supple. Carotid bruit is not present.  Cardiovascular: Normal rate, regular rhythm, normal heart sounds, intact distal pulses and normal pulses.   Pulmonary/Chest: Breath sounds normal. No respiratory distress. He has no wheezes. He has no rales.  Course breath sounds   Abdominal: Soft. Bowel sounds are normal. There is no tenderness. There is no guarding.  Musculoskeletal:  Normal range of motion.  Lymphadenopathy:       Head (right side): No submandibular adenopathy present.       Head (left side): No submandibular adenopathy present.    He has no cervical adenopathy.  Neurological: He is alert and oriented to person, place, and time. He has normal strength. No cranial nerve deficit or sensory deficit.  Skin: Skin is warm and dry. No rash noted.  Psychiatric: He has a normal mood and affect. His  speech is normal.    ED Course  Procedures (including critical care time) Labs Review Labs Reviewed - No data to display Imaging Review No results found.  EKG Interpretation   None       MDM  No diagnosis found. *I have reviewed nursing notes, vital signs, and all appropriate lab and imaging results for this patient.**  The patient has been having cough and runny nose for approximately 4 weeks. The patient states he was seen by the [facility] today he was given medications for his illness, but these were not covered by Medicaid and he could not afford them. The patient presents to the emergency department because he was having" chills" and was generally feeling worse.  Vital signs reviewed. Pulse oximetry 100% on room air. Within normal limits by my interpretation. The examination is consistent with an upper respiratory infection. The plan at this time is for the patient to receive ibuprofen for body aches. Afrin spray for congestion and cough, and prednisone taper. The patient is advised to see the physicians at [facility] or return to the emergency department if any changes, problems, or concerns.  Kathie Dike, PA-C 11/09/13 2116

## 2013-11-09 NOTE — ED Notes (Signed)
Cough, runny nose for 4 weeks Seen by [facility] today and given  Rx, for inhaler and cough med. But medicaid would not cover this.  Tonight   Felt he had a fever and came to ER

## 2013-11-09 NOTE — ED Provider Notes (Signed)
Medical screening examination/treatment/procedure(s) were performed by non-physician practitioner and as supervising physician I was immediately available for consultation/collaboration.     Louvenia Golomb, MD 11/09/13 2311 

## 2013-11-09 NOTE — ED Notes (Signed)
Detailed education to patient on use of nasal spary and dc instructions

## 2014-07-02 ENCOUNTER — Encounter (HOSPITAL_COMMUNITY): Payer: Self-pay | Admitting: Emergency Medicine

## 2014-07-02 ENCOUNTER — Emergency Department (HOSPITAL_COMMUNITY)
Admission: EM | Admit: 2014-07-02 | Discharge: 2014-07-02 | Disposition: A | Discharge status: Home / Self Care | Payer: Medicaid Other | Attending: Emergency Medicine | Admitting: Emergency Medicine

## 2014-07-02 DIAGNOSIS — Z8659 Personal history of other mental and behavioral disorders: Secondary | ICD-10-CM | POA: Diagnosis not present

## 2014-07-02 DIAGNOSIS — J45909 Unspecified asthma, uncomplicated: Secondary | ICD-10-CM | POA: Insufficient documentation

## 2014-07-02 DIAGNOSIS — R042 Hemoptysis: Secondary | ICD-10-CM | POA: Diagnosis present

## 2014-07-02 DIAGNOSIS — R1013 Epigastric pain: Secondary | ICD-10-CM | POA: Insufficient documentation

## 2014-07-02 DIAGNOSIS — F17200 Nicotine dependence, unspecified, uncomplicated: Secondary | ICD-10-CM | POA: Diagnosis not present

## 2014-07-02 DIAGNOSIS — R112 Nausea with vomiting, unspecified: Secondary | ICD-10-CM | POA: Insufficient documentation

## 2014-07-02 DIAGNOSIS — K920 Hematemesis: Secondary | ICD-10-CM | POA: Insufficient documentation

## 2014-07-02 LAB — COMPREHENSIVE METABOLIC PANEL
ALT: 35 U/L (ref 0–53)
AST: 22 U/L (ref 0–37)
Albumin: 4.4 g/dL (ref 3.5–5.2)
Alkaline Phosphatase: 120 U/L — ABNORMAL HIGH (ref 39–117)
Anion gap: 11 (ref 5–15)
BUN: 13 mg/dL (ref 6–23)
CALCIUM: 10.2 mg/dL (ref 8.4–10.5)
CO2: 26 mEq/L (ref 19–32)
Chloride: 106 mEq/L (ref 96–112)
Creatinine, Ser: 0.8 mg/dL (ref 0.50–1.35)
GFR calc non Af Amer: >90 mL/min (ref >90)
GLUCOSE: 107 mg/dL — AB (ref 70–99)
Potassium: 4.1 mEq/L (ref 3.7–5.3)
SODIUM: 143 meq/L (ref 137–147)
TOTAL PROTEIN: 6.7 g/dL (ref 6.0–8.3)
Total Bilirubin: 0.7 mg/dL (ref 0.3–1.2)

## 2014-07-02 LAB — URINALYSIS, ROUTINE W REFLEX MICROSCOPIC
Bilirubin Urine: NEGATIVE
Glucose, UA: NEGATIVE mg/dL
HGB URINE DIPSTICK: NEGATIVE
Ketones, ur: NEGATIVE mg/dL
Leukocytes, UA: NEGATIVE
Nitrite: NEGATIVE
Protein, ur: NEGATIVE mg/dL
Specific Gravity, Urine: 1.01 (ref 1.005–1.030)
Urobilinogen, UA: 0.2 mg/dL (ref 0.0–1.0)
pH: 7.5 (ref 5.0–8.0)

## 2014-07-02 LAB — CBC WITH DIFFERENTIAL/PLATELET
BASOS ABS: 0 10*3/uL (ref 0.0–0.1)
Basophils Relative: 1 % (ref 0–1)
EOS ABS: 0.2 10*3/uL (ref 0.0–0.7)
EOS PCT: 4 % (ref 0–5)
HEMATOCRIT: 45.5 % (ref 39.0–52.0)
Hemoglobin: 15.8 g/dL (ref 13.0–17.0)
Lymphocytes Relative: 31 % (ref 12–46)
Lymphs Abs: 2 10*3/uL (ref 0.7–4.0)
MCH: 28.6 pg (ref 26.0–34.0)
MCHC: 34.7 g/dL (ref 30.0–36.0)
MCV: 82.4 fL (ref 78.0–100.0)
Monocytes Absolute: 0.5 10*3/uL (ref 0.1–1.0)
Monocytes Relative: 8 % (ref 3–12)
Neutro Abs: 3.8 10*3/uL (ref 1.7–7.7)
Neutrophils Relative %: 56 % (ref 43–77)
PLATELETS: 235 10*3/uL (ref 150–400)
RBC: 5.52 MIL/uL (ref 4.22–5.81)
RDW: 12.8 % (ref 11.5–15.5)
WBC: 6.6 10*3/uL (ref 4.0–10.5)

## 2014-07-02 MED ORDER — SODIUM CHLORIDE 0.9 % IV SOLN
Freq: Once | INTRAVENOUS | Status: AC
  Administered 2014-07-02: 09:00:00 via INTRAVENOUS

## 2014-07-02 MED ORDER — FAMOTIDINE IN NACL 20-0.9 MG/50ML-% IV SOLN
20.0000 mg | Freq: Once | INTRAVENOUS | Status: AC
  Administered 2014-07-02: 20 mg via INTRAVENOUS
  Filled 2014-07-02: qty 50

## 2014-07-02 MED ORDER — ONDANSETRON HCL 4 MG/2ML IJ SOLN
4.0000 mg | Freq: Once | INTRAMUSCULAR | Status: AC
  Administered 2014-07-02: 4 mg via INTRAVENOUS
  Filled 2014-07-02: qty 2

## 2014-07-02 MED ORDER — ONDANSETRON 4 MG PO TBDP: 4.0000 mg | ORAL_TABLET | Freq: Three times a day (TID) | ORAL | Status: DC | PRN

## 2014-07-02 MED ORDER — RANITIDINE HCL 150 MG PO TABS: 150.0000 mg | ORAL_TABLET | Freq: Two times a day (BID) | ORAL | Status: DC

## 2014-07-02 NOTE — ED Notes (Signed)
Pt states at 0500 felted nauseated, started "throwing up blood", pt has pic on phone of small drops of bright red blood in toilet.

## 2014-07-02 NOTE — ED Provider Notes (Signed)
Medical screening examination/treatment/procedure(s) were performed by non-physician practitioner and as supervising physician I was immediately available for consultation/collaboration.   EKG Interpretation None        Vernard Gram, MD 07/02/14 1506 

## 2014-07-02 NOTE — ED Provider Notes (Signed)
CSN: [id number]     Arrival date & time 07/02/14  0810 History   First MD Initiated Contact with Patient 07/02/14 0813     Chief Complaint  Patient presents with  . Hemoptysis     (Consider location/radiation/quality/duration/timing/severity/associated sxs/prior Treatment) Patient is a 33 y.o. male presenting with vomiting. The history is provided by the patient.  Emesis Severity:  Moderate Duration:  3 hours Timing:  Intermittent Number of daily episodes:  3 Quality:  Bright red blood Able to tolerate:  Liquids Progression:  Partially resolved Chronicity:  New Recent urination:  Normal Relieved by:  Liquids Worsened by:  Nothing tried Associated symptoms: no diarrhea and no fever  Abdominal pain: epigastric    Risk factors: no alcohol use, no suspect food intake and no travel to endemic areas    [name] is a 33 y.o. male who presents to the ED with nausea and vomiting that started approximately 5 am. He states that he has been vomiting blood. Patient states that he has had epigastric pain with the vomiting. Patient denies ETOH use recently.    Past Medical History  Diagnosis Date  . Asthma   . ADHD (attention deficit hyperactivity disorder)    Past Surgical History  Procedure Laterality Date  . Head surgery (as a baby)     History reviewed. No pertinent family history. History  Substance Use Topics  . Smoking status: Current Some Day Smoker  . Smokeless tobacco: Not on file  . Alcohol Use: Yes     Comment: occasional    Review of Systems  Gastrointestinal: Positive for vomiting. Negative for diarrhea. Abdominal pain: epigastric   all other systems negative    Allergies  Bee venom  Home Medications   Prior to Admission medications   Medication Sig Start Date End Date Taking? Authorizing Provider  aspirin-acetaminophen-caffeine (EXCEDRIN MIGRAINE) [phone] [id number] MG per tablet Take 2 tablets by mouth every 8 (eight) hours as needed for migraine.   Yes  Historical Provider, MD  Doxylamine Succinate, Sleep, (SLEEP AID PO) Take 1 tablet by mouth at bedtime.   Yes Historical Provider, MD   BP 142/93  Pulse 59  Temp(Src) 98.4 F (36.9 C) (Oral)  Resp 18  Ht 5\' 11"  (1.803 m)  Wt 180 lb (81.647 kg)  BMI 25.12 kg/m2  SpO2 97% Physical Exam  Nursing note and vitals reviewed. Constitutional: He is oriented to person, place, and time. He appears well-developed and well-nourished. No distress.  HENT:  Head: Normocephalic.  Nose: Nose normal.  Mouth/Throat: Uvula is midline, oropharynx is clear and moist and mucous membranes are normal.  Eyes: Conjunctivae and EOM are normal. Pupils are equal, round, and reactive to light.  Neck: Neck supple.  Cardiovascular: Normal rate and regular rhythm.   Pulmonary/Chest: Effort normal and breath sounds normal.  Abdominal: Soft. Bowel sounds are normal. There is tenderness in the epigastric area. There is no CVA tenderness.  Musculoskeletal: Normal range of motion.  Neurological: He is alert and oriented to person, place, and time. No cranial nerve deficit.  Skin: Skin is warm and dry.  Psychiatric: He has a normal mood and affect. His behavior is normal.    ED Course  Procedures Labs, IV fluids, Zofran, Pepcid  Results for orders placed during the hospital encounter of 07/02/14 (from the past 24 hour(s))  CBC WITH DIFFERENTIAL     Status: None   Collection Time    07/02/14  8:38 AM      Result Value Ref  Range   WBC 6.6  4.0 - 10.5 K/uL   RBC 5.52  4.22 - 5.81 MIL/uL   Hemoglobin 15.8  13.0 - 17.0 g/dL   HCT 16.1  09.6 - 04.5 %   MCV 82.4  78.0 - 100.0 fL   MCH 28.6  26.0 - 34.0 pg   MCHC 34.7  30.0 - 36.0 g/dL   RDW 40.9  81.1 - 91.4 %   Platelets 235  150 - 400 K/uL   Neutrophils Relative % 56  43 - 77 %   Neutro Abs 3.8  1.7 - 7.7 K/uL   Lymphocytes Relative 31  12 - 46 %   Lymphs Abs 2.0  0.7 - 4.0 K/uL   Monocytes Relative 8  3 - 12 %   Monocytes Absolute 0.5  0.1 - 1.0 K/uL    Eosinophils Relative 4  0 - 5 %   Eosinophils Absolute 0.2  0.0 - 0.7 K/uL   Basophils Relative 1  0 - 1 %   Basophils Absolute 0.0  0.0 - 0.1 K/uL  COMPREHENSIVE METABOLIC PANEL     Status: Abnormal   Collection Time    07/02/14  8:38 AM      Result Value Ref Range   Sodium 143  137 - 147 mEq/L   Potassium 4.1  3.7 - 5.3 mEq/L   Chloride 106  96 - 112 mEq/L   CO2 26  19 - 32 mEq/L   Glucose, Bld 107 (*) 70 - 99 mg/dL   BUN 13  6 - 23 mg/dL   Creatinine, Ser 7.82  0.50 - 1.35 mg/dL   Calcium 95.6  8.4 - 21.3 mg/dL   Total Protein 6.7  6.0 - 8.3 g/dL   Albumin 4.4  3.5 - 5.2 g/dL   AST 22  0 - 37 U/L   ALT 35  0 - 53 U/L   Alkaline Phosphatase 120 (*) 39 - 117 U/L   Total Bilirubin 0.7  0.3 - 1.2 mg/dL   GFR calc non Af Amer >90  >90 mL/min   GFR calc Af Amer >90  >90 mL/min   Anion gap 11  5 - 15  URINALYSIS, ROUTINE W REFLEX MICROSCOPIC     Status: None   Collection Time    07/02/14  9:30 AM      Result Value Ref Range   Color, Urine YELLOW  YELLOW   APPearance CLEAR  CLEAR   Specific Gravity, Urine 1.010  1.005 - 1.030   pH 7.5  5.0 - 8.0   Glucose, UA NEGATIVE  NEGATIVE mg/dL   Hgb urine dipstick NEGATIVE  NEGATIVE   Bilirubin Urine NEGATIVE  NEGATIVE   Ketones, ur NEGATIVE  NEGATIVE mg/dL   Protein, ur NEGATIVE  NEGATIVE mg/dL   Urobilinogen, UA 0.2  0.0 - 1.0 mg/dL   Nitrite NEGATIVE  NEGATIVE   Leukocytes, UA NEGATIVE  NEGATIVE    MDM  33 y.o. male with nausea and vomiting x 3 that started at 5 am. Feeling much better, has not had vomiting since arrival to the ED. Taking PO fluids without difficulty. Stable for discharge with normal labs and improved symptoms. Discussed with the patient and all questioned fully answered. He will return if any problems arise.    Medication List    TAKE these medications       ondansetron 4 MG disintegrating tablet  Commonly known as:  ZOFRAN ODT  Take 1 tablet (4 mg total) by mouth every  8 (eight) hours as needed for nausea or  vomiting.     ranitidine 150 MG tablet  Commonly known as:  ZANTAC  Take 1 tablet (150 mg total) by mouth 2 (two) times daily.      ASK your doctor about these medications       aspirin-acetaminophen-caffeine 250-250-65 MG per tablet  Commonly known as:  EXCEDRIN MIGRAINE  Take 2 tablets by mouth every 8 (eight) hours as needed for migraine.     SLEEP AID PO  Take 1 tablet by mouth at bedtime.          Janne Napoleon, [place] 07/02/14 1040

## 2015-12-20 ENCOUNTER — Emergency Department (HOSPITAL_COMMUNITY)
Admission: EM | Admit: 2015-12-20 | Discharge: 2015-12-20 | Disposition: A | Discharge status: Home / Self Care | Payer: Medicaid Other | Attending: Emergency Medicine | Admitting: Emergency Medicine

## 2015-12-20 ENCOUNTER — Encounter (HOSPITAL_COMMUNITY): Payer: Self-pay | Admitting: Emergency Medicine

## 2015-12-20 DIAGNOSIS — H109 Unspecified conjunctivitis: Secondary | ICD-10-CM | POA: Diagnosis not present

## 2015-12-20 DIAGNOSIS — Z8659 Personal history of other mental and behavioral disorders: Secondary | ICD-10-CM | POA: Insufficient documentation

## 2015-12-20 DIAGNOSIS — J45909 Unspecified asthma, uncomplicated: Secondary | ICD-10-CM | POA: Diagnosis not present

## 2015-12-20 DIAGNOSIS — F17200 Nicotine dependence, unspecified, uncomplicated: Secondary | ICD-10-CM | POA: Diagnosis not present

## 2015-12-20 DIAGNOSIS — H578 Other specified disorders of eye and adnexa: Secondary | ICD-10-CM | POA: Diagnosis present

## 2015-12-20 MED ORDER — GENTAMICIN SULFATE 0.3 % OP SOLN: 1.0000 [drp] | OPHTHALMIC | Status: DC

## 2015-12-20 NOTE — ED Provider Notes (Signed)
CSN: [id number]     Arrival date & time 12/20/15  0505 History   First MD Initiated Contact with Patient 12/20/15 [phone]     Chief Complaint  Patient presents with  . Eye Drainage      HPI  Patient presents for redness and drainage of his right eye. Started with some white drainage yesterday. His eye was "stuck shot with yellow stuff" this morning. Redness and discomfort of the right eye today. Is not affecting his vision. He has chronic esotropia,laterally of the left eye. States his vision is right eye today is normal. He has no photophobia.  Past Medical History  Diagnosis Date  . Asthma   . ADHD (attention deficit hyperactivity disorder)    Past Surgical History  Procedure Laterality Date  . Head surgery (as a baby)     History reviewed. No pertinent family history. Social History  Substance Use Topics  . Smoking status: Current Some Day Smoker  . Smokeless tobacco: None  . Alcohol Use: Yes     Comment: occasional    Review of Systems  Constitutional: Negative for fever, chills, diaphoresis, appetite change and fatigue.  HENT: Negative for mouth sores, sore throat and trouble swallowing.   Eyes: Positive for discharge, redness and itching. Negative for visual disturbance.  Respiratory: Negative for cough, chest tightness, shortness of breath and wheezing.   Cardiovascular: Negative for chest pain.  Gastrointestinal: Negative for nausea, vomiting, abdominal pain, diarrhea and abdominal distention.  Endocrine: Negative for polydipsia, polyphagia and polyuria.  Genitourinary: Negative for dysuria, frequency and hematuria.  Musculoskeletal: Negative for gait problem.  Skin: Negative for color change, pallor and rash.  Neurological: Negative for dizziness, syncope, light-headedness and headaches.  Hematological: Does not bruise/bleed easily.  Psychiatric/Behavioral: Negative for behavioral problems and confusion.      Allergies  Bee venom  Home Medications   Prior to  Admission medications   Medication Sig Start Date End Date Taking? Authorizing Provider  aspirin-acetaminophen-caffeine (EXCEDRIN MIGRAINE) [phone] MG per tablet Take 2 tablets by mouth every 8 (eight) hours as needed for migraine.    Historical Provider, MD  Doxylamine Succinate, Sleep, (SLEEP AID PO) Take 1 tablet by mouth at bedtime.    Historical Provider, MD  gentamicin (GARAMYCIN) 0.3 % ophthalmic solution Place 1 drop into the left eye every 4 (four) hours. 12/20/15   Rolland PorterMark Ada Holness, MD  ondansetron (ZOFRAN ODT) 4 MG disintegrating tablet Take 1 tablet (4 mg total) by mouth every 8 (eight) hours as needed for nausea or vomiting. 07/02/14   Hope Orlene OchM Neese, NP  ranitidine (ZANTAC) 150 MG tablet Take 1 tablet (150 mg total) by mouth 2 (two) times daily. 07/02/14   Hope Orlene OchM Neese, NP   BP 144/94 mmHg  Pulse 62  Temp(Src) 97.9 F (36.6 C) (Oral)  Resp 20  Ht 5\' 11"  (1.803 m)  Wt 140 lb (63.504 kg)  BMI 19.53 kg/m2  SpO2 99% Physical Exam  HENT:  Right eye with conjunctival injection of the bulbar and palpebral conjunctiva. Left eye deviated laterally. Symmetric reactive pupils. EOM otherwise full. No preauricular adenopathy. Clear anterior chambers.    ED Course  Procedures (including critical care time) Labs Review Labs Reviewed - No data to display  Imaging Review No results found. I have personally reviewed and evaluated these images and lab results as part of my medical decision-making.   EKG Interpretation None      MDM   Final diagnoses:  Conjunctivitis of right eye  Clear anterior chamber. Diffuse injection. No preauricular adenopathy. Normal left eye other than chronic esotropia. Garamycin drops for conjunctivitis.    Rolland Porter, MD 12/20/15 [phone]

## 2015-12-20 NOTE — ED Notes (Signed)
Pt c/o R eye drainage since 8pm last night. Denies injury.

## 2015-12-20 NOTE — Discharge Instructions (Signed)

## 2016-01-10 ENCOUNTER — Encounter (HOSPITAL_COMMUNITY): Payer: Self-pay | Admitting: *Deleted

## 2016-01-10 ENCOUNTER — Emergency Department (HOSPITAL_COMMUNITY)
Admission: EM | Admit: 2016-01-10 | Discharge: 2016-01-10 | Disposition: A | Discharge status: Home / Self Care | Payer: Medicaid Other | Attending: Emergency Medicine | Admitting: Emergency Medicine

## 2016-01-10 DIAGNOSIS — F17200 Nicotine dependence, unspecified, uncomplicated: Secondary | ICD-10-CM | POA: Insufficient documentation

## 2016-01-10 DIAGNOSIS — R0981 Nasal congestion: Secondary | ICD-10-CM | POA: Insufficient documentation

## 2016-01-10 DIAGNOSIS — J45909 Unspecified asthma, uncomplicated: Secondary | ICD-10-CM | POA: Insufficient documentation

## 2016-01-10 DIAGNOSIS — H109 Unspecified conjunctivitis: Secondary | ICD-10-CM | POA: Insufficient documentation

## 2016-01-10 DIAGNOSIS — H578 Other specified disorders of eye and adnexa: Secondary | ICD-10-CM | POA: Diagnosis present

## 2016-01-10 MED ORDER — TOBRAMYCIN 0.3 % OP SOLN
2.0000 [drp] | Freq: Once | OPHTHALMIC | Status: AC
  Administered 2016-01-10: 2 [drp] via OPHTHALMIC
  Filled 2016-01-10: qty 5

## 2016-01-10 NOTE — ED Provider Notes (Signed)
CSN: [id number]     Arrival date & time 01/10/16  1042 History   First MD Initiated Contact with Patient 01/10/16 1120     Chief Complaint  Patient presents with  . Conjunctivitis     (Consider location/radiation/quality/duration/timing/severity/associated sxs/prior Treatment) Patient is a 35 y.o. male presenting with conjunctivitis. The history is provided by the patient.  Conjunctivitis This is a new problem. The current episode started today. The problem occurs constantly. The problem has been unchanged. Associated symptoms include congestion. Pertinent negatives include no abdominal pain, arthralgias, chest pain, coughing, fever, nausea, neck pain or vomiting. Associated symptoms comments: Nasal congestion. Exacerbated by: bright light. He has tried nothing for the symptoms. The treatment provided no relief.    Past Medical History  Diagnosis Date  . Asthma   . ADHD (attention deficit hyperactivity disorder)    Past Surgical History  Procedure Laterality Date  . Head surgery (as a baby)     History reviewed. No pertinent family history. Social History  Substance Use Topics  . Smoking status: Current Some Day Smoker  . Smokeless tobacco: None  . Alcohol Use: Yes     Comment: occasional    Review of Systems  Constitutional: Negative for fever and activity change.       All ROS Neg except as noted in HPI  HENT: Positive for congestion. Negative for nosebleeds.   Eyes: Positive for photophobia, discharge, redness and itching.  Respiratory: Negative for cough, shortness of breath and wheezing.   Cardiovascular: Negative for chest pain and palpitations.  Gastrointestinal: Negative for nausea, vomiting, abdominal pain and blood in stool.  Genitourinary: Negative for dysuria, frequency and hematuria.  Musculoskeletal: Negative for back pain, arthralgias and neck pain.  Skin: Negative.   Neurological: Negative for dizziness, seizures and speech difficulty.   Psychiatric/Behavioral: Negative for hallucinations and confusion.  All other systems reviewed and are negative.     Allergies  Bee venom  Home Medications   Prior to Admission medications   Medication Sig Start Date End Date Taking? Authorizing Provider  gentamicin (GARAMYCIN) 0.3 % ophthalmic solution Place 1 drop into the left eye every 4 (four) hours. Patient not taking: Reported on 01/10/2016 12/20/15   Rolland Porter, MD   BP 146/81 mmHg  Pulse 67  Temp(Src) 98.9 F (37.2 C) (Oral)  Resp 16  Ht  (1.778 m)  Wt 78.926 kg  BMI 24.97 kg/m2  SpO2 99% Physical Exam  Constitutional: He is oriented to person, place, and time. He appears well-developed and well-nourished.  Non-toxic appearance.  HENT:  Head: Normocephalic.  Right Ear: Tympanic membrane and external ear normal.  Left Ear: Tympanic membrane and external ear normal.  Eyes: EOM are normal. Pupils are equal, round, and reactive to light. Right eye exhibits no chemosis and no hordeolum. No foreign body present in the right eye. Left eye exhibits no chemosis and no hordeolum. No foreign body present in the left eye. Right conjunctiva is injected. Left conjunctiva is injected.  Suspect left eye weakness with some deviation laterally.  Neck: Normal range of motion. Neck supple. Carotid bruit is not present.  Cardiovascular: Normal rate, regular rhythm, normal heart sounds, intact distal pulses and normal pulses.   Pulmonary/Chest: Breath sounds normal. No respiratory distress.  Abdominal: Soft. Bowel sounds are normal. There is no tenderness. There is no guarding.  Musculoskeletal: Normal range of motion.  Lymphadenopathy:       Head (right side): No submandibular adenopathy present.  Head (left side): No submandibular adenopathy present.    He has no cervical adenopathy.  Neurological: He is alert and oriented to person, place, and time. He has normal strength. No cranial nerve deficit or sensory deficit.  Skin:  Skin is warm and dry.  Psychiatric: He has a normal mood and affect. His speech is normal.  Nursing note and vitals reviewed.   ED Course  Procedures (including critical care time) Labs Review Labs Reviewed - No data to display  Imaging Review No results found. I have personally reviewed and evaluated these images and lab results as part of my medical decision-making.   EKG Interpretation None      MDM  Exam favors bilateral conjunctivitis. The patient is provided mask. He just use a hat with brim to protect from bright light changes. He is given tobramycin 2 drops in each eye every 4 hours. We discussed good handwashing, as well as cool compresses to the eyes. Patient is to follow with ophthalmology if not improving.    Final diagnoses:  None    **I have reviewed nursing notes, vital signs, and all appropriate lab and imaging results for this patient.Ivery Quale, PA-C 01/10/16 1858  Bethann Berkshire, MD 01/11/16 [phone]

## 2016-01-10 NOTE — ED Notes (Signed)
Pt recently treated for conjunctivitis, states he used all his eye drops but woke up this morning with both eyes itching and draining.

## 2016-01-10 NOTE — Discharge Instructions (Signed)
Your examination is consistent with bilateral conjunctivitis. This is contagious. Please use a mask until symptoms have resolved. Please wash hands frequently. Please change her pillowcase is daily. Keep her distance from others. Use 2 drops of tobramycin in each eye every 4 hours for 5 days. Please see your eye specialist if not improving. Bacterial Conjunctivitis Bacterial conjunctivitis, commonly called pink eye, is an inflammation of the clear membrane that covers the white part of the eye (conjunctiva). The inflammation can also happen on the underside of the eyelids. The blood vessels in the conjunctiva become inflamed, causing the eye to become red or pink. Bacterial conjunctivitis may spread easily from one eye to another and from person to person (contagious).  CAUSES  Bacterial conjunctivitis is caused by bacteria. The bacteria may come from your own skin, your upper respiratory tract, or from someone else with bacterial conjunctivitis. SYMPTOMS  The normally white color of the eye or the underside of the eyelid is usually pink or red. The pink eye is usually associated with irritation, tearing, and some sensitivity to light. Bacterial conjunctivitis is often associated with a thick, yellowish discharge from the eye. The discharge may turn into a crust on the eyelids overnight, which causes your eyelids to stick together. If a discharge is present, there may also be some blurred vision in the affected eye. DIAGNOSIS  Bacterial conjunctivitis is diagnosed by your caregiver through an eye exam and the symptoms that you report. Your caregiver looks for changes in the surface tissues of your eyes, which may point to the specific type of conjunctivitis. A sample of any discharge may be collected on a cotton-tip swab if you have a severe case of conjunctivitis, if your cornea is affected, or if you keep getting repeat infections that do not respond to treatment. The sample will be sent to a lab to see if  the inflammation is caused by a bacterial infection and to see if the infection will respond to antibiotic medicines. TREATMENT   Bacterial conjunctivitis is treated with antibiotics. Antibiotic eyedrops are most often used. However, antibiotic ointments are also available. Antibiotics pills are sometimes used. Artificial tears or eye washes may ease discomfort. HOME CARE INSTRUCTIONS   To ease discomfort, apply a cool, clean washcloth to your eye for 10-20 minutes, 3-4 times a day.  Gently wipe away any drainage from your eye with a warm, wet washcloth or a cotton ball.  Wash your hands often with soap and water. Use paper towels to dry your hands.  Do not share towels or washcloths. This may spread the infection.  Change or wash your pillowcase every day.  You should not use eye makeup until the infection is gone.  Do not operate machinery or drive if your vision is blurred.  Stop using contact lenses. Ask your caregiver how to sterilize or replace your contacts before using them again. This depends on the type of contact lenses that you use.  When applying medicine to the infected eye, do not touch the edge of your eyelid with the eyedrop bottle or ointment tube. SEEK IMMEDIATE MEDICAL CARE IF:   Your infection has not improved within 3 days after beginning treatment.  You had yellow discharge from your eye and it returns.  You have increased eye pain.  Your eye redness is spreading.  Your vision becomes blurred.  You have a fever or persistent symptoms for more than 2-3 days.  You have a fever and your symptoms suddenly get worse.  You  have facial pain, redness, or swelling. MAKE SURE YOU:   Understand these instructions.  Will watch your condition.  Will get help right away if you are not doing well or get worse.   This information is not intended to replace advice given to you by your health care provider. Make sure you discuss any questions you have with your  health care provider.   Document Released: 12/03/2005 Document Revised: 12/24/2014 Document Reviewed: 05/05/2012 Elsevier Interactive Patient Education [employer]

## 2016-01-10 NOTE — ED Notes (Signed)
PA at bedside.

## 2016-01-16 ENCOUNTER — Encounter (HOSPITAL_COMMUNITY): Payer: Self-pay | Admitting: Emergency Medicine

## 2016-01-16 ENCOUNTER — Emergency Department (HOSPITAL_COMMUNITY)
Admission: EM | Admit: 2016-01-16 | Discharge: 2016-01-16 | Disposition: A | Discharge status: Home / Self Care | Payer: Medicaid Other | Attending: Emergency Medicine | Admitting: Emergency Medicine

## 2016-01-16 DIAGNOSIS — J45909 Unspecified asthma, uncomplicated: Secondary | ICD-10-CM | POA: Insufficient documentation

## 2016-01-16 DIAGNOSIS — Z8659 Personal history of other mental and behavioral disorders: Secondary | ICD-10-CM | POA: Insufficient documentation

## 2016-01-16 DIAGNOSIS — H109 Unspecified conjunctivitis: Secondary | ICD-10-CM | POA: Insufficient documentation

## 2016-01-16 DIAGNOSIS — F17200 Nicotine dependence, unspecified, uncomplicated: Secondary | ICD-10-CM | POA: Diagnosis not present

## 2016-01-16 DIAGNOSIS — H578 Other specified disorders of eye and adnexa: Secondary | ICD-10-CM | POA: Diagnosis present

## 2016-01-16 MED ORDER — POLYMYXIN B-TRIMETHOPRIM 10000-0.1 UNIT/ML-% OP SOLN: 1.0000 [drp] | OPHTHALMIC | Status: DC

## 2016-01-16 NOTE — ED Provider Notes (Signed)
CSN: [id number]     Arrival date & time 01/16/16  1424 History  By signing my name below, I, Murriel Hopper, attest that this documentation has been prepared under the direction and in the presence of Sharilyn Sites, PA-C.  Electronically Signed: Murriel Hopper, ED Scribe. 01/16/2016. 3:01 PM.    Chief Complaint  Patient presents with  . Eye Problem     Patient is a 35 y.o. male presenting with eye problem. The history is provided by the patient. No language interpreter was used.  Eye Problem Associated symptoms: itching and redness    HPI Comments: [name] is a 35 y.o. male who presents to the Emergency Department complaining of constant, worsening bilateral eye pain that has been present for a few weeks. Pt states he has been to ED 5x for the same symptoms, and was seen here 6 days ago for the same symptoms. Pt states he was given eye drop medication for conjunctivitis and states he always runs out of the eye drops on his third day. Pt states he has not gotten anything in his eye, and reports his eye has been itching and red constantly. Pt denies wearing glasses or contact lenses.  Patient has not seen an eye doctor in several years.  No fever, chills, sweats.   Past Medical History  Diagnosis Date  . Asthma   . ADHD (attention deficit hyperactivity disorder)    Past Surgical History  Procedure Laterality Date  . Head surgery (as a baby)     History reviewed. No pertinent family history. Social History  Substance Use Topics  . Smoking status: Current Some Day Smoker  . Smokeless tobacco: None  . Alcohol Use: Yes     Comment: occasional    Review of Systems  Constitutional: Negative for fever and chills.  Eyes: Positive for pain, redness and itching.  All other systems reviewed and are negative.     Allergies  Bee venom  Home Medications   Prior to Admission medications   Medication Sig Start Date End Date Taking? Authorizing Provider  gentamicin (GARAMYCIN) 0.3  % ophthalmic solution Place 1 drop into the left eye every 4 (four) hours. Patient not taking: Reported on 01/10/2016 12/20/15   Rolland Porter, MD   BP 152/87 mmHg  Pulse 75  Temp(Src) 99 F (37.2 C) (Tympanic)  Resp 14  Ht  (1.803 m)  Wt 184 lb (83.462 kg)  BMI 25.67 kg/m2  SpO2 99%   Physical Exam  Constitutional: He is oriented to person, place, and time. He appears well-developed and well-nourished.  HENT:  Head: Normocephalic and atraumatic.  Mouth/Throat: Oropharynx is clear and moist.  Eyes: EOM and lids are normal. Pupils are equal, round, and reactive to light. Right conjunctiva is injected. Left conjunctiva is not injected.  No lid edema or erythema; Right conjunctiva injected, more so along medial aspect; left conjunctiva grossly normal; left eye is deviated laterally (chronic), EOMs otherwise fully intact and non-painful; no FB noted; no signs of trauma  Neck: Normal range of motion.  Cardiovascular: Normal rate, regular rhythm and normal heart sounds.   Pulmonary/Chest: Effort normal and breath sounds normal. No respiratory distress. He has no wheezes.  Musculoskeletal: Normal range of motion.  Neurological: He is alert and oriented to person, place, and time.  Skin: Skin is warm and dry.  Psychiatric: He has a normal mood and affect.  Nursing note and vitals reviewed.   ED Course  Procedures (including critical care time)  DIAGNOSTIC STUDIES: Oxygen Saturation is 99% on room air, normal by my interpretation.    COORDINATION OF CARE: 3:00 PM Discussed treatment plan with pt at bedside and pt agreed to plan.   Labs Review Labs Reviewed - No data to display  Imaging Review No results found. I have personally reviewed and evaluated these images and lab results as part of my medical decision-making.   EKG Interpretation None      MDM   Final diagnoses:  Conjunctivitis of right eye   35 year old male here with recurrent conjunctivitis. Seen here  multiple times for the same. He states he runs out of antibiotics on the third day and symptoms will return. On exam his left eye is deviated laterally which appears chronic for him, EOMs otherwise fully intact and non-painful. He does have evidence of a mild conjunctivitis of the right eye, worse along the medial aspect.  His pupils are reactive bilaterally. He has no signs of ocular trauma or foreign body exposure. No lid edema or erythema to suggest orbital or preseptal cellulitis.  Patient has already completed courses of gentamicin and Tobrex drops without resolution-- will switch to polytrim.  Patient was again referred to ophthalmology.  Discussed plan with patient, he/she acknowledged understanding and agreed with plan of care.  Return precautions given for new or worsening symptoms.  I personally performed the services described in this documentation, which was scribed in my presence. The recorded information has been reviewed and is accurate.  Garlon Hatchet, PA-C 01/16/16 1518  Zadie Rhine, MD 01/16/16 [phone]

## 2016-01-16 NOTE — ED Notes (Signed)
Pt reports repeated problems with his eyes, seen here 6 days ago for same.

## 2016-01-16 NOTE — Discharge Instructions (Signed)
Take the prescribed medication as directed. Make sure to keep face and eyes cleaned with warm cloth.  Follow-up with eye doctor if you continue having issues. Return to the ED for new or worsening symptoms.  Bacterial Conjunctivitis Bacterial conjunctivitis, commonly called pink eye, is an inflammation of the clear membrane that covers the white part of the eye (conjunctiva). The inflammation can also happen on the underside of the eyelids. The blood vessels in the conjunctiva become inflamed, causing the eye to become red or pink. Bacterial conjunctivitis may spread easily from one eye to another and from person to person (contagious).  CAUSES  Bacterial conjunctivitis is caused by bacteria. The bacteria may come from your own skin, your upper respiratory tract, or from someone else with bacterial conjunctivitis. SYMPTOMS  The normally white color of the eye or the underside of the eyelid is usually pink or red. The pink eye is usually associated with irritation, tearing, and some sensitivity to light. Bacterial conjunctivitis is often associated with a thick, yellowish discharge from the eye. The discharge may turn into a crust on the eyelids overnight, which causes your eyelids to stick together. If a discharge is present, there may also be some blurred vision in the affected eye. DIAGNOSIS  Bacterial conjunctivitis is diagnosed by your caregiver through an eye exam and the symptoms that you report. Your caregiver looks for changes in the surface tissues of your eyes, which may point to the specific type of conjunctivitis. A sample of any discharge may be collected on a cotton-tip swab if you have a severe case of conjunctivitis, if your cornea is affected, or if you keep getting repeat infections that do not respond to treatment. The sample will be sent to a lab to see if the inflammation is caused by a bacterial infection and to see if the infection will respond to antibiotic medicines. TREATMENT     Bacterial conjunctivitis is treated with antibiotics. Antibiotic eyedrops are most often used. However, antibiotic ointments are also available. Antibiotics pills are sometimes used. Artificial tears or eye washes may ease discomfort. HOME CARE INSTRUCTIONS   To ease discomfort, apply a cool, clean washcloth to your eye for 10-20 minutes, 3-4 times a day.  Gently wipe away any drainage from your eye with a warm, wet washcloth or a cotton ball.  Wash your hands often with soap and water. Use paper towels to dry your hands.  Do not share towels or washcloths. This may spread the infection.  Change or wash your pillowcase every day.  You should not use eye makeup until the infection is gone.  Do not operate machinery or drive if your vision is blurred.  Stop using contact lenses. Ask your caregiver how to sterilize or replace your contacts before using them again. This depends on the type of contact lenses that you use.  When applying medicine to the infected eye, do not touch the edge of your eyelid with the eyedrop bottle or ointment tube. SEEK IMMEDIATE MEDICAL CARE IF:   Your infection has not improved within 3 days after beginning treatment.  You had yellow discharge from your eye and it returns.  You have increased eye pain.  Your eye redness is spreading.  Your vision becomes blurred.  You have a fever or persistent symptoms for more than 2-3 days.  You have a fever and your symptoms suddenly get worse.  You have facial pain, redness, or swelling. MAKE SURE YOU:   Understand these instructions.  Will watch  your condition.  Will get help right away if you are not doing well or get worse.   This information is not intended to replace advice given to you by your health care provider. Make sure you discuss any questions you have with your health care provider.   Document Released: 12/03/2005 Document Revised: 12/24/2014 Document Reviewed: 05/05/2012 Elsevier  Interactive Patient Education [employer]2016 Elsevier Inc.

## 2016-01-16 NOTE — ED Notes (Signed)
Pt has bilateral eye redness with drainage noted. Pt has recently had pink eye and given medication for tx. Pt states he was using his medication every 4 hours but would run out of medication within 3 days.

## 2016-03-25 ENCOUNTER — Encounter (HOSPITAL_COMMUNITY): Payer: Self-pay | Admitting: *Deleted

## 2016-03-25 ENCOUNTER — Emergency Department (HOSPITAL_COMMUNITY): Payer: Medicaid Other

## 2016-03-25 ENCOUNTER — Emergency Department (HOSPITAL_COMMUNITY)
Admission: EM | Admit: 2016-03-25 | Discharge: 2016-03-25 | Disposition: A | Discharge status: Home / Self Care | Payer: Medicaid Other | Attending: Dermatology | Admitting: Dermatology

## 2016-03-25 DIAGNOSIS — Z5321 Procedure and treatment not carried out due to patient leaving prior to being seen by health care provider: Secondary | ICD-10-CM | POA: Insufficient documentation

## 2016-03-25 DIAGNOSIS — M549 Dorsalgia, unspecified: Secondary | ICD-10-CM | POA: Insufficient documentation

## 2016-03-25 DIAGNOSIS — F17200 Nicotine dependence, unspecified, uncomplicated: Secondary | ICD-10-CM | POA: Insufficient documentation

## 2016-03-25 DIAGNOSIS — F909 Attention-deficit hyperactivity disorder, unspecified type: Secondary | ICD-10-CM | POA: Diagnosis not present

## 2016-03-25 DIAGNOSIS — R079 Chest pain, unspecified: Secondary | ICD-10-CM | POA: Diagnosis present

## 2016-03-25 DIAGNOSIS — J45909 Unspecified asthma, uncomplicated: Secondary | ICD-10-CM | POA: Insufficient documentation

## 2016-03-25 NOTE — ED Notes (Signed)
As I was going to the waiting room to get a pt to triage, Mr. [name] was walking out. This RN asked the pt where he was going and he said "outside." This RN asked the pt if he was leaving and he said "yes." This RN asked pt if he was sure that he was leaving and again he said "yes."
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
## 2016-04-03 ENCOUNTER — Encounter (HOSPITAL_COMMUNITY): Payer: Self-pay | Admitting: *Deleted

## 2016-04-03 ENCOUNTER — Emergency Department (HOSPITAL_COMMUNITY)
Admission: EM | Admit: 2016-04-03 | Discharge: 2016-04-04 | Disposition: A | Discharge status: Other Acute Inpatient Hospital | Payer: Medicaid Other | Attending: Emergency Medicine | Admitting: Emergency Medicine

## 2016-04-03 DIAGNOSIS — F329 Major depressive disorder, single episode, unspecified: Secondary | ICD-10-CM | POA: Diagnosis not present

## 2016-04-03 DIAGNOSIS — F17200 Nicotine dependence, unspecified, uncomplicated: Secondary | ICD-10-CM | POA: Diagnosis not present

## 2016-04-03 DIAGNOSIS — Z046 Encounter for general psychiatric examination, requested by authority: Secondary | ICD-10-CM | POA: Diagnosis present

## 2016-04-03 DIAGNOSIS — J45909 Unspecified asthma, uncomplicated: Secondary | ICD-10-CM | POA: Diagnosis not present

## 2016-04-03 DIAGNOSIS — F32A Depression, unspecified: Secondary | ICD-10-CM

## 2016-04-03 LAB — RAPID URINE DRUG SCREEN, HOSP PERFORMED
Amphetamines: NOT DETECTED
Barbiturates: NOT DETECTED
Benzodiazepines: NOT DETECTED
Cocaine: NOT DETECTED
Opiates: NOT DETECTED
TETRAHYDROCANNABINOL: NOT DETECTED

## 2016-04-03 LAB — COMPREHENSIVE METABOLIC PANEL
ALT: 26 U/L (ref 17–63)
AST: 17 U/L (ref 15–41)
Albumin: 4.4 g/dL (ref 3.5–5.0)
Alkaline Phosphatase: 88 U/L (ref 38–126)
Anion gap: 9 (ref 5–15)
BUN: 12 mg/dL (ref 6–20)
CO2: 26 mmol/L (ref 22–32)
Calcium: 8.5 mg/dL — ABNORMAL LOW (ref 8.9–10.3)
Chloride: 108 mmol/L (ref 101–111)
Creatinine, Ser: 0.83 mg/dL (ref 0.61–1.24)
GFR calc Af Amer: >60 mL/min (ref >60)
GFR calc non Af Amer: >60 mL/min (ref >60)
Glucose, Bld: 94 mg/dL (ref 65–99)
Potassium: 3.4 mmol/L — ABNORMAL LOW (ref 3.5–5.1)
Sodium: 143 mmol/L (ref 135–145)
Total Bilirubin: 1.3 mg/dL — ABNORMAL HIGH (ref 0.3–1.2)
Total Protein: 6.7 g/dL (ref 6.5–8.1)

## 2016-04-03 LAB — CBC
HEMATOCRIT: 43.8 % (ref 39.0–52.0)
Hemoglobin: 14.5 g/dL (ref 13.0–17.0)
MCH: 28.1 pg (ref 26.0–34.0)
MCHC: 33.1 g/dL (ref 30.0–36.0)
MCV: 84.9 fL (ref 78.0–100.0)
Platelets: 221 10*3/uL (ref 150–400)
RBC: 5.16 MIL/uL (ref 4.22–5.81)
RDW: 13 % (ref 11.5–15.5)
WBC: 6.6 10*3/uL (ref 4.0–10.5)

## 2016-04-03 LAB — ETHANOL: Alcohol, Ethyl (B): <5 mg/dL (ref <5)

## 2016-04-03 LAB — SALICYLATE LEVEL

## 2016-04-03 LAB — ACETAMINOPHEN LEVEL: Acetaminophen (Tylenol), Serum: <10 ug/mL — ABNORMAL LOW (ref 10–30)

## 2016-04-03 NOTE — ED Notes (Signed)
Pt. Reports he was doing a live video on Facebook, stating that he wanted to hurt himself by ingesting pills. Patient escorted by [employer]. Patient stating he does not really want to hurt himself.

## 2016-04-03 NOTE — ED Provider Notes (Signed)
CSN: [id number]     Arrival date & time 04/03/16  1823 History   First MD Initiated Contact with Patient 04/03/16 1927     Chief Complaint  Patient presents with  . V70.1     (Consider location/radiation/quality/duration/timing/severity/associated sxs/prior Treatment) HPI..... Patient apparently dis a live Facebook video where he stated he was going to hurt himself by ingesting a large number of pills. He did take some pills, but then spit them out immediately. He stated this behavior was an attention getting device, but he had no suicidal or homicidal ideation. Past medical history includes ADHD. No street drugs or alcohol.  Past Medical History  Diagnosis Date  . Asthma   . ADHD (attention deficit hyperactivity disorder)    Past Surgical History  Procedure Laterality Date  . Head surgery (as a baby)     History reviewed. No pertinent family history. Social History  Substance Use Topics  . Smoking status: Current Some Day Smoker  . Smokeless tobacco: None  . Alcohol Use: Yes     Comment: occasional    Review of Systems  All other systems reviewed and are negative.     Allergies  Bee venom  Home Medications   Prior to Admission medications   Medication Sig Start Date End Date Taking? Authorizing Provider  gentamicin (GARAMYCIN) 0.3 % ophthalmic solution Place 1 drop into the left eye every 4 (four) hours. Patient not taking: Reported on 04/03/2016 12/20/15   Rolland PorterMark James, MD  trimethoprim-polymyxin b [facility](POLYTRIM) ophthalmic solution Place 1 drop into both eyes every 4 (four) hours. Patient not taking: Reported on 04/03/2016 01/16/16   Garlon HatchetLisa M Sanders, PA-C   BP 145/93 mmHg  Pulse 65  Temp(Src) 98.1 F (36.7 C) (Oral)  Resp 16  Ht 5\' 11"  (1.803 m)  Wt 181 lb (82.101 kg)  BMI 25.26 kg/m2  SpO2 100% Physical Exam  Constitutional: He is oriented to person, place, and time.  Restless but not psychotic  HENT:  Head: Normocephalic and atraumatic.  Eyes: Conjunctivae and  EOM are normal. Pupils are equal, round, and reactive to light.  Neck: Normal range of motion. Neck supple.  Cardiovascular: Normal rate and regular rhythm.   Pulmonary/Chest: Effort normal and breath sounds normal.  Abdominal: Soft. Bowel sounds are normal.  Musculoskeletal: Normal range of motion.  Neurological: He is alert and oriented to person, place, and time.  Skin: Skin is warm and dry.  Psychiatric:  Flat affect  Nursing note and vitals reviewed.   ED Course  Procedures (including critical care time) Labs Review Labs Reviewed  COMPREHENSIVE METABOLIC PANEL - Abnormal; Notable for the following:    Potassium 3.4 (*)    Calcium 8.5 (*)    Total Bilirubin 1.3 (*)    All other components within normal limits  ACETAMINOPHEN LEVEL - Abnormal; Notable for the following:    Acetaminophen (Tylenol), Serum <10 (*)    All other components within normal limits  ETHANOL  SALICYLATE LEVEL  CBC  URINE RAPID DRUG SCREEN, HOSP PERFORMED    Imaging Review No results found. I have personally reviewed and evaluated these images and lab results as part of my medical decision-making.   EKG Interpretation None      MDM   Final diagnoses:  Depression    Patient apparently spit out all his pills. He is not homicidal or suicidal. Will obtain behavioral health consult    Donnetta HutchingBrian Joan Herschberger, MD 04/03/16 2201

## 2016-04-03 NOTE — ED Notes (Signed)
Per officer, patient put "a bunch" of pills in his mouth but immediately spit them back out. States it is unknown what the pills were.

## 2016-04-04 ENCOUNTER — Inpatient Hospital Stay (HOSPITAL_COMMUNITY)
Admission: EM | Admit: 2016-04-04 | Discharge: 2016-04-06 | DRG: 882 | Disposition: A | Discharge status: Home / Self Care | Payer: Medicaid Other | Source: Intra-hospital | Attending: Psychiatry | Admitting: Psychiatry

## 2016-04-04 ENCOUNTER — Encounter (HOSPITAL_COMMUNITY): Payer: Self-pay

## 2016-04-04 DIAGNOSIS — F17200 Nicotine dependence, unspecified, uncomplicated: Secondary | ICD-10-CM | POA: Diagnosis present

## 2016-04-04 DIAGNOSIS — F332 Major depressive disorder, recurrent severe without psychotic features: Secondary | ICD-10-CM | POA: Diagnosis not present

## 2016-04-04 DIAGNOSIS — R45851 Suicidal ideations: Secondary | ICD-10-CM

## 2016-04-04 DIAGNOSIS — F909 Attention-deficit hyperactivity disorder, unspecified type: Secondary | ICD-10-CM | POA: Diagnosis present

## 2016-04-04 DIAGNOSIS — F4323 Adjustment disorder with mixed anxiety and depressed mood: Principal | ICD-10-CM | POA: Diagnosis present

## 2016-04-04 DIAGNOSIS — F339 Major depressive disorder, recurrent, unspecified: Secondary | ICD-10-CM | POA: Diagnosis present

## 2016-04-04 HISTORY — DX: Suicidal ideations: R45.851

## 2016-04-04 MED ORDER — ALUM & MAG HYDROXIDE-SIMETH 200-200-20 MG/5ML PO SUSP: 30.0000 mL | ORAL | Status: DC | PRN

## 2016-04-04 MED ORDER — MAGNESIUM HYDROXIDE 400 MG/5ML PO SUSP: 30.0000 mL | Freq: Every day | ORAL | Status: DC | PRN

## 2016-04-04 MED ORDER — ACETAMINOPHEN 325 MG PO TABS: 650.0000 mg | ORAL_TABLET | Freq: Four times a day (QID) | ORAL | Status: DC | PRN

## 2016-04-04 MED ORDER — HYDROXYZINE HCL 25 MG PO TABS: 25.0000 mg | ORAL_TABLET | Freq: Four times a day (QID) | ORAL | Status: DC | PRN

## 2016-04-04 MED ORDER — TRAZODONE HCL 50 MG PO TABS: 50.0000 mg | ORAL_TABLET | Freq: Every evening | ORAL | Status: DC | PRN

## 2016-04-04 MED ORDER — POTASSIUM CHLORIDE CRYS ER 10 MEQ PO TBCR
10.0000 meq | EXTENDED_RELEASE_TABLET | Freq: Two times a day (BID) | ORAL | Status: AC
  Administered 2016-04-04 – 2016-04-05 (×3): 10 meq via ORAL
  Filled 2016-04-04 (×4): qty 1

## 2016-04-04 NOTE — ED Notes (Signed)
Pt on phone at this time 

## 2016-04-04 NOTE — Tx Team (Addendum)
Initial Interdisciplinary Treatment Plan   PATIENT STRESSORS: Educational concerns Marital or family conflict   PATIENT STRENGTHS: Communication skills  Active sense of humor - Earl ManySara Twyman, RN 04/05/2016 11:27 AM Average or above average intelligence - Earl ManySara Twyman, RN 04/05/2016 11:27 AM   PROBLEM LIST: Problem List/Patient Goals Date to be addressed Date deferred Reason deferred Estimated date of resolution  Depression 04/04/16     Anxiety 04/04/16     Suicide attempt 04/04/16     "I just want to go back home" 04/04/16     "Have more confidence" 04/04/16                              DISCHARGE CRITERIA:  Improved stabilization in mood, thinking, and/or behavior Verbal commitment to aftercare and medication compliance  PRELIMINARY DISCHARGE PLAN: Outpatient therapy Medication management  PATIENT/FAMIILY INVOLVEMENT: This treatment plan has been presented to and reviewed with the patient, Vernell LeepJerome D Pursley.  The patient and family have been given the opportunity to ask questions and make suggestions.  Norm ParcelHeather V Reddick 04/04/2016, 2:28 PM
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
## 2016-04-04 NOTE — Tx Team (Signed)
Interdisciplinary Treatment Plan Update (Adult) Date: 04/04/2016   Date: 04/04/2016 3:52 PM  Progress in Treatment:  Attending groups: Pt is new to milieu, continuing to assess  Participating in groups: Pt is new to milieu, continuing to assess  Taking medication as prescribed: Yes  Tolerating medication: Yes  Family/Significant othe contact made: No, CSW assessing for appropriate contact Patient understands diagnosis: Yes AEB seeking help with depression Discussing patient identified problems/goals with staff: Yes  Medical problems stabilized or resolved: Yes  Denies suicidal/homicidal ideation: Yes Patient has not harmed self or Others: Yes   New problem(s) identified: None identified at this time.   Discharge Plan or Barriers: CSW will assess for appropriate discharge plan and relevant barriers.   Additional comments:  Patient and CSW reviewed pt's identified goals and treatment plan. Patient verbalized understanding and agreed to treatment plan. CSW reviewed [facility] "Discharge Process and Patient Involvement" Form. Pt verbalized understanding of information provided and signed form.   Reason for Continuation of Hospitalization:  Depression Medication stabilization Suicidal ideation  Estimated length of stay: 3-5 days  Review of initial/current patient goals per problem list:   1.  Goal(s): Patient will participate in aftercare plan  Met:  No  Target date: 3-5 days from date of admission   As evidenced by: Patient will participate within aftercare plan AEB aftercare provider and housing plan at discharge being identified.  04/04/16: CSW to work with Pt to assess for appropriate discharge plan and faciliate appointments and referrals as needed prior to d/c.  2.  Goal (s): Patient will exhibit decreased depressive symptoms and suicidal ideations.  Met:  No  Target date: 3-5 days from date of admission   As evidenced by: Patient will utilize self rating of depression at 3 or  below and demonstrate decreased signs of depression or be deemed stable for discharge by MD. 04/04/16: Pt was admitted with symptoms of depression, rating 10/10. Pt continues to present with flat affect and depressive symptoms.  Pt will demonstrate decreased symptoms of depression and rate depression at 3/10 or lower prior to discharge.  Attendees:  Patient:    Family:    Physician: Dr. Parke Poisson, MD  04/04/2016 3:52 PM  Nursing: Lars Pinks, RN Case manager  04/04/2016 3:52 PM  Clinical Social Worker Peri Maris, [place] 04/04/2016 3:52 PM  Other: Tilden Fossa, [place] 04/04/2016 3:52 PM  Clinical: Mayra Neer, RN 04/04/2016 3:52 PM  Other: , RN Charge Nurse 04/04/2016 3:52 PM  Other: Hilda Lias, [place], [place] Social Work [phone]

## 2016-04-04 NOTE — Progress Notes (Signed)
Patient ID: [name], male   DOB: 03/04/1981, 35 y.o.   MRN: [id number] D: Patient sitting in dayroom reading a book on approach. Pt calm and pleasant reports a friend told him to "go kill yourself". Pt reports having no other friends and was trying to prove a point went on facebook live, put pill in mouth but reports spitting it back out later. Pt wants to discharge to get back to school. Denies SI/HI/AVH and pain. A: Support and encouragement offered as needed.   R: Patient safe on unit.

## 2016-04-04 NOTE — BHH Suicide Risk Assessment (Signed)
[facility]   Nursing information obtained from:  Patient Demographic factors:  Male Current Mental Status:  Suicidal ideation indicated by others Loss Factors:  NA Historical Factors:  Prior suicide attempts Risk Reduction Factors:  Living with another person, especially a relative  Total Time spent with patient: 45 minutes Principal Problem: suicidal ideations  Diagnosis:   Patient Active Problem List   Diagnosis Date Noted  . Major depressive disorder, recurrent episode (HCC) [F33.9] 04/04/2016     Continued Clinical Symptoms:  Alcohol Use Disorder Identification Test Final Score (AUDIT): 5 The "Alcohol Use Disorders Identification Test", Guidelines for Use in Primary Care, Second Edition.  World Science writerHealth Organization [employer](WHO). Score between 0-7:  no or low risk or alcohol related problems. Score between 8-15:  moderate risk of alcohol related problems. Score between 16-19:  high risk of alcohol related problems. Score 20 or above:  warrants further diagnostic evaluation for alcohol dependence and treatment.   CLINICAL FACTORS:  35 year old male, lives with brother, on disability. Reports he recently made suicidal threat of overdosing via facebook/social media. States he was upset about arguments with friends. States he was not actually having any suicidal ideations, and that his intent was not suicidal . States he did not actually overdose, but simply put a pill in his mouth and then spit it out. Denies recent depression or any actual recent suicidal ideations. Does have history of prior psychiatric admissions and one prior suicidal attempt several years ago by overdosing . At this time not interested in taking any standing psychiatric medications      Psychiatric Specialty Exam: ROS  Blood pressure 170/80, pulse 66, temperature 98 F (36.7 C), temperature source Oral, resp. rate 16, height 5\' 11"  (1.803 m), weight 171 lb (77.565 kg), SpO2 99 %.Body mass  index is 23.86 kg/(m^2).   see admit note MSE                                                       COGNITIVE FEATURES THAT CONTRIBUTE TO RISK:  Limited insight    SUICIDE RISK:   Mild:  Suicidal ideation of limited frequency, intensity, duration, and specificity.  There are no identifiable plans, no associated intent, mild dysphoria and related symptoms, good self-control (both objective and subjective assessment), few other risk factors, and identifiable protective factors, including available and accessible social support.  PLAN OF CARE: Patient will be admitted to inpatient psychiatric unit for stabilization and safety. Will provide and encourage milieu participation. Provide medication management and maked adjustments as needed.  Will follow daily.    I certify that inpatient services furnished can reasonably be expected to improve the patient's condition.   Nehemiah MassedOBOS, Davette Nugent, MD 04/04/2016, 5:38 PM

## 2016-04-04 NOTE — Progress Notes (Signed)
Kevin FentonJerome is a 35 year old male being admitted involuntarily to 406-2 from [facility].   [place] [facility] [place] [facility] [place] to a 911 call stating Kevin FentonJerome was threatening to OD on Facebook.  Pt stated that he put the pills in his mouth but spit them out without swallowing.  He stated that this was a stunt to bluff a friend and his girlfriend because they were arguing.  He reported history of suicide attempt at age 35.  He denies SI/HI or A/V hallucinations.  He reports feeling down sometimes and no energy.  He feels that he gets irritable at times.  Admission paperwork completed and signed.  Belongings searched and secured in locker # 36 (red/black hat and red/black tennis shoes).  Skin assessment completed and noted scar on left side of head from tubes inserted as premature baby and tattoo  on right shoulder.  Q 15 minute checks initiated for safety.  We will monitor the progress towards his goals.

## 2016-04-04 NOTE — BHH Counselor (Signed)
Per Clint Bolderori Beck, [facility].  Pt can come TOMORROW- Coordinate w Day Shift AC for time to transport.  Attending Dr. Jama Flavorsobos.  Beryle FlockMary Camila [place], MS, CRC, [facility] Triage Specialist [facility]

## 2016-04-04 NOTE — ED Provider Notes (Signed)
1:30 AM  D/w Corrie DandyMary with TTS.  Pt is a 35 y.o. with previous h/o suicide attempt who ingested pills on Facebook live in an attempt to kill himself.  Later claimed it was for attention only.  TTS recommends inpt treatment.  Pt wants to leave.  Will IVC patient.  He is medically clear.  Layla MawKristen N Aoki Wedemeyer, DO 04/04/16 [phone]

## 2016-04-04 NOTE — [facility] Group Notes (Signed)
[facility] LCSW Group Therapy 04/04/2016 1:15 PM  Type of Therapy: Group Therapy- Emotion Regulation  Participation Level: Active   Participation Quality:  Appropriate  Affect: Appropriate  Cognitive: Alert and Oriented   Insight:  Developing/Improving  Engagement in Therapy: Developing/Improving and Engaged   Modes of Intervention: Clarification, Confrontation, Discussion, Education, Exploration, Limit-setting, Orientation, Problem-solving, Rapport Building, Dance movement psychotherapisteality Testing, Socialization and Support  Summary of Progress/Problems: The topic for group today was emotional regulation. This group focused on both positive and negative emotion identification and allowed group members to process ways to identify feelings, regulate negative emotions, and find healthy ways to manage internal/external emotions. Group members were asked to reflect on a time when their reaction to an emotion led to a negative outcome and explored how alternative responses using emotion regulation would have benefited them. Group members were also asked to discuss a time when emotion regulation was utilized when a negative emotion was experienced. Pt expressed that conflict and anger are difficult for him to regulate. He expressed that he feels betrayed by friends and isolated in his life which causes him to feel sad and angry. Pt shared his reason for admission. He identified his mother's death 5 years ago as a contributing factor to his ongoing depression.   Louis CordialLauren Carter, LCSWA 04/04/2016 2:09 PM
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
## 2016-04-05 DIAGNOSIS — F332 Major depressive disorder, recurrent severe without psychotic features: Secondary | ICD-10-CM | POA: Diagnosis present

## 2016-04-05 LAB — LIPID PANEL
Cholesterol: 175 mg/dL (ref 0–200)
HDL: 37 mg/dL — ABNORMAL LOW (ref >40)
LDL Cholesterol: 121 mg/dL — ABNORMAL HIGH (ref 0–99)
Total CHOL/HDL Ratio: 4.7 RATIO
Triglycerides: 86 mg/dL (ref <150)
VLDL: 17 mg/dL (ref 0–40)

## 2016-04-05 LAB — TSH: TSH: 1.648 u[IU]/mL (ref 0.350–4.500)

## 2016-04-05 NOTE — Progress Notes (Signed)
[facility] MD Progress Note  04/05/2016 4:20 PM [name]  MRN:  [id number] Subjective:  t seen and chart reviewed. Pt is alert/oriented x4, calm, cooperative, and appropriate to situation. Pt denies suicidal/homicidal ideation and psychosis and does not appear to be responding to internal stimuli. Pt reports that he felt he was not truly suicidal to begin with, but admits that perhaps he is not being honest with himself about his true intentions given his current optimistic outlook on his social media endeavors vs. His current minimization of true intent. Pt in agreement to work closely with others in group to identify triggers, impulse control, and coping skills.   History of Present Illness: I have reviewed and concur with HPI elements from my colleague Dr. Jama Flavors, modified as follows: 35 year old male. Yesterday he posted on social media regarding suicidal attempt . States " it was a video, I said I was going to kill myself, and I put a pill in my mouth , but then I spit it out ".  States " I was really not suicidal , I was just upset " . States he did not actually have any suicidal intention . Describes he was irritated yesterday, because he was being given a difficult time because " of my un-friending this guy on facebook " and was arguing back and forth with friends of his. States he has not been feeling depressed recently, and minimizes neurovegetative symptoms of depression at this time. Denies any recent suicidal ideations. States " someone must have called the police, because they showed up and Brought me to the hospital"  Principal Problem: MDD (major depressive disorder), recurrent severe, without psychosis (HCC) Diagnosis:   Patient Active Problem List   Diagnosis Date Noted  . MDD (major depressive disorder), recurrent severe, without psychosis (HCC) [F33.2] 04/05/2016    Priority: High  . Suicide ideation [R45.851] 04/04/2016    Priority: High   Total Time spent with patient: 15  minutes   Past Medical History:  Past Medical History  Diagnosis Date  . Asthma   . ADHD (attention deficit hyperactivity disorder)     Past Surgical History  Procedure Laterality Date  . Head surgery (as a baby)     Family History: History reviewed. No pertinent family history.  Social History:  History  Alcohol Use  . Yes    Comment: occasional     History  Drug Use No    Social History   Social History  . Marital Status: Single    Spouse Name: N/A  . Number of Children: N/A  . Years of Education: N/A   Social History Main Topics  . Smoking status: Current Some Day Smoker  . Smokeless tobacco: None  . Alcohol Use: Yes     Comment: occasional  . Drug Use: No  . Sexual Activity: Not Asked   Other Topics Concern  . None   Social History Narrative   Additional Social History:                         Sleep: Fair  Appetite:  Good  Current Medications: Current Facility-Administered Medications  Medication Dose Route Frequency Provider Last Rate Last Dose  . acetaminophen (TYLENOL) tablet 650 mg  650 mg Oral Q6H PRN Sanjuana Kava, NP      . alum & mag hydroxide-simeth (MAALOX/MYLANTA) 200-200-20 MG/5ML suspension 30 mL  30 mL Oral Q4H PRN Sanjuana Kava, NP      .  hydrOXYzine (ATARAX/VISTARIL) tablet 25 mg  25 mg Oral Q6H PRN Sanjuana KavaAgnes I Nwoko, NP      . magnesium hydroxide (MILK OF MAGNESIA) suspension 30 mL  30 mL Oral Daily PRN Sanjuana KavaAgnes I Nwoko, NP      . potassium chloride (K-DUR,KLOR-CON) CR tablet 10 mEq  10 mEq Oral BID Craige CottaFernando A Cobos, MD   10 mEq at 04/05/16 0817  . traZODone (DESYREL) tablet 50 mg  50 mg Oral QHS PRN Craige CottaFernando A Cobos, MD        Lab Results:  Results for orders placed or performed during the hospital encounter of 04/04/16 (from the past 48 hour(s))  Lipid panel, fasting     Status: Abnormal   Collection Time: 04/05/16  6:27 AM  Result Value Ref Range   Cholesterol 175 0 - 200 mg/dL   Triglycerides 86 <454<150 mg/dL   HDL 37 (L)  >09>40 mg/dL   Total CHOL/HDL Ratio 4.7 RATIO   VLDL 17 0 - 40 mg/dL   LDL Cholesterol 811121 (H) 0 - 99 mg/dL    Comment:        Total Cholesterol/HDL:CHD Risk Coronary Heart Disease Risk Table                     Men   Women  1/2 Average Risk   3.4   3.3  Average Risk       5.0   4.4  2 X Average Risk   9.6   7.1  3 X Average Risk  23.4   11.0        Use the calculated Patient Ratio above and the CHD Risk Table to determine the patient's CHD Risk.        ATP III CLASSIFICATION (LDL):  <100     mg/dL   Optimal  [id number]  mg/dL   Near or Above                    Optimal  130-159  mg/dL   Borderline  [id number]  mg/dL   High  >086>190     mg/dL   Very High Performed at [facility]   TSH     Status: None   Collection Time: 04/05/16  6:27 AM  Result Value Ref Range   TSH 1.648 0.350 - 4.500 uIU/mL    Comment: Performed at [facility]    Blood Alcohol level:  Lab Results  Component Value Date   [facility] 04/[facility]2016    Physical Findings: AIMS: Facial and Oral Movements Muscles of Facial Expression: None, normal Lips and Perioral Area: None, normal Jaw: None, normal Tongue: None, normal,Extremity Movements Upper (arms, wrists, hands, fingers): None, normal Lower (legs, knees, ankles, toes): None, normal, Trunk Movements Neck, shoulders, hips: None, normal, Overall Severity Severity of abnormal movements (highest score from questions above): None, normal Incapacitation due to abnormal movements: None, normal Patient's awareness of abnormal movements (rate only patient's report): No Awareness, Dental Status Current problems with teeth and/or dentures?: No Does patient usually wear dentures?: No  CIWA:    COWS:     Musculoskeletal: Strength & Muscle Tone: within normal limits Gait & Station: normal Patient leans: N/A  Psychiatric Specialty Exam: Review of Systems  Psychiatric/Behavioral: Positive for depression. Negative for suicidal ideas and  hallucinations. The patient is nervous/anxious and has insomnia.   All other systems reviewed and are negative.   Blood pressure 119/80, pulse 65, temperature 97.4 F (36.3 C), temperature  source Oral, resp. rate 18, height  (1.803 m), weight 77.565 kg (171 lb), SpO2 99 %.Body mass index is 23.86 kg/(m^2).  General Appearance: Casual and Fairly Groomed  Patent attorney::  Good  Speech:  Clear and Coherent and Normal Rate  Volume:  Normal  Mood:  Anxious and Depressed  Affect:  Appropriate and Congruent  Thought Process:  Coherent and Goal Directed  Orientation:  Full (Time, Place, and Person)  Thought Content:  Symptoms, worries, concerns  Suicidal Thoughts:  No  Homicidal Thoughts:  No  Memory:  Immediate;   Fair Recent;   Fair Remote;   Fair  Judgement:  Fair  Insight:  Fair  Psychomotor Activity:  Normal  Concentration:  Fair  Recall:  [employer] of Knowledge:Fair  Language: Fair  Akathisia:  No  Handed:    AIMS (if indicated):     Assets:  Communication Skills Desire for Improvement Physical Health Resilience Social Support  ADL's:  Intact  Cognition: WNL  Sleep:  Number of Hours: 5.5   Treatment Plan Summary: MDD (major depressive disorder), recurrent severe, without psychosis (HCC) , unstable, managed as below:  Medications: -Continue Vistaril  po q6h prn anxiety -Continue Trazodone  qhs prn insomnia  Beau Fanny, FNP 04/05/2016, 4:20 PM

## 2016-04-05 NOTE — [facility] Group Notes (Signed)
[facility] 04/05/2016 1:15pm  Type of Therapy: Mental Health Association Presentation  Participation Level: Active  Participation Quality: Attentive  Affect: Appropriate  Cognitive: Oriented  Insight: Developing/Improving  Engagement in Therapy: Engaged  Modes of Intervention: Discussion, Education and Socialization  Summary of Progress/Problems: Mental Health Association (MHA) Speaker came to talk about his personal journey with substance abuse and addiction. The pt processed ways by which to relate to the speaker. MHA speaker provided handouts and educational information pertaining to groups and services offered by the [facility]. Pt was engaged in speaker's presentation and was receptive to resources provided.    Chad CordialLauren Carter, LCSWA 04/05/2016 3:32 PM

## 2016-04-05 NOTE — Progress Notes (Signed)
Patient ID: [name], male   DOB: 03/04/1981, 35 y.o.   MRN: [id number] D: Patient in dayroom watching TV and interacting well with peers. Pt rated depression and anxiety as 0 on 0-10 scale. Denies SI/HI/AVH and pain.No behavioral issues noted.  A: Support and encouragement offered as needed.  R: Patient cooperative and appropriate on unit. Pt hoping to discharge tomorrow.

## 2016-04-05 NOTE — Progress Notes (Signed)
[facility] Group Notes:  (Nursing/MHT/Case Management/Adjunct)  Date:  04/05/2016  Time:  2100  Type of Therapy:  wrap up group  Participation Level:  Active  Participation Quality:  Appropriate, Attentive, Sharing and Supportive  Affect:  Irritable  Cognitive:  Appropriate and Lacking  Insight:  Appropriate  Engagement in Group:  Engaged  Modes of Intervention:  Clarification, Education and Support  Summary of Progress/Problems:  Marcille BuffyMcNeil, Rodgers Likes S 04/05/2016, 10:41 PM

## 2016-04-05 NOTE — Plan of Care (Signed)
Problem: Ineffective individual coping Goal: STG: Patient will remain free from self harm Outcome: Progressing Pt is safe and free of self harm.     

## 2016-04-05 NOTE — Progress Notes (Signed)
Patient ID: [name], male   DOB: 03/04/1981, 35 y.o.   MRN: [id number]   Pt currently presents with a flat affect and guarded behavior. Per self inventory, pt rates depression, hopelessness and anxiety at a 0. Pt's daily goal is to "how to cope with my anger and situations" and they intend to do so by" listen more" Pt reports good sleep, a good appetite, normal energy and good concentration. Pt interacts little with other staff and patients.   Pt provided with medications per providers orders. Pt's labs and vitals were monitored throughout the day. Pt supported emotionally and encouraged to express concerns and questions. Pt educated on medications.  Pt's safety ensured with 15 minute and environmental checks. Pt currently denies SI/HI and A/V hallucinations. Pt verbally agrees to seek staff if SI/HI or A/VH occurs and to consult with staff before acting on these thoughts. Will continue POC.

## 2016-04-05 NOTE — Progress Notes (Addendum)
Patient ID: Vernell LeepJerome D Hinze, male   DOB: 03/04/1981, 35 y.o.   MRN: [id number]  Adult Psychoeducational Group Note  Date:  04/05/2016 Time: 09:00am  Group Topic/Focus:  Orientation:   The focus of this group is to educate the patient on the purpose and policies of crisis stabilization and provide a format to answer questions about their admission.  The group details unit policies and expectations of patients while admitted.  Participation Level:  Did Not Attend  Participation Quality: n/a  Affect: n/a  Cognitive: n/a  Insight: n/a  Engagement in Group: n/a  Modes of Intervention:  Discussion, Education, Orientation and Support  Additional Comments:  Pt chose not to attend group, pt in bed asleep.   Aurora Maskwyman, Quindell Shere E 04/05/2016, 11:13 AM

## 2016-04-05 NOTE — Plan of Care (Signed)
Problem: Diagnosis: Increased Risk For Suicide Attempt Goal: STG-Patient Will Attend All Groups On The Unit Outcome: Progressing Pt attended and participated in evening wrap up group.     

## 2016-04-06 DIAGNOSIS — F909 Attention-deficit hyperactivity disorder, unspecified type: Secondary | ICD-10-CM

## 2016-04-06 DIAGNOSIS — F4323 Adjustment disorder with mixed anxiety and depressed mood: Principal | ICD-10-CM | POA: Clinically undetermined

## 2016-04-06 HISTORY — DX: Attention-deficit hyperactivity disorder, unspecified type: F90.9

## 2016-04-06 MED ORDER — TRAZODONE HCL 50 MG PO TABS: 50.0000 mg | ORAL_TABLET | Freq: Every evening | ORAL | Status: DC | PRN

## 2016-04-06 MED ORDER — HYDROXYZINE HCL 25 MG PO TABS: 25.0000 mg | ORAL_TABLET | Freq: Three times a day (TID) | ORAL | Status: DC

## 2016-04-06 NOTE — Progress Notes (Signed)
  [facility] :  Will you be returning to the same living situation after discharge:  Yes,  Pt returning home to brother's house At discharge, do you have transportation home?: Yes,  Pt brother to pick up Do you have the ability to pay for your medications: N/A; no medications prescribed  Release of information consent forms completed and in the chart;  Patient's signature needed at discharge.  Patient to Follow up at: Follow-up Information    Follow up with Pt declines referral for outpatient services.      Next level of care provider [facility] Link:no  Safety Planning and Suicide Prevention discussed: Yes,  with Pt; no contact information available for brother  Have you used any form of tobacco in the last 30 days? (Cigarettes, Smokeless Tobacco, Cigars, and/or Pipes): Yes  Has patient been referred to the Quitline?: Patient refused referral  Patient has been referred for addiction treatment: N/A  [name], [name] M 04/06/2016, 9:37 AM

## 2016-04-06 NOTE — Discharge Summary (Signed)
Physician Discharge Summary Note  Patient:  [name] is an 35 y.o., male MRN:  [id number] DOB:  03/04/1981 Patient phone:  [phone] (home)  Patient address:   [address] [place] [id number],  Total Time spent with patient: 30 minutes  Date of Admission:  04/04/2016 Date of Discharge: 04/06/16  Reason for Admission: Patient posted on social media regarding suicidal attempt . States " it was a video, I said I was [name] to kill myself, and I put a pill in my mouth , but then I spit it out ".   Principal Problem: Adjustment disorder with mixed anxiety and depressed mood Discharge Diagnoses: Patient Active Problem List   Diagnosis Date Noted  . Adjustment disorder with mixed anxiety and depressed mood [F43.23] 04/06/2016  . Attention deficit hyperactivity disorder (ADHD) [F90.9] 04/06/2016  . Suicide ideation [R45.851] 04/04/2016    Past Psychiatric History: He has had several psychiatric admissions since he was a child. Last admission was " a few years ago" : He states he has had one actual suicidal attempt " where I was trying to kill myself for real", by overdosing . Denies history of mania, denies history of psychosis, endorses history of ADHD. Describes intermittent explosiveness .   Past Medical History:  Past Medical History  Diagnosis Date  . Asthma   . ADHD (attention deficit hyperactivity disorder)     Past Surgical History  Procedure Laterality Date  . Head surgery (as a baby)     Family History: History reviewed. No pertinent family history. Family Psychiatric  History: His mother passed away in 2011, from a CNS aneurysm, father alive, but has no contact with patient ; and has not contact with his 4 brothers, 4 sisters . Social History:  History  Alcohol Use  . Yes    Comment: occasional     History  Drug Use No    Social History   Social History  . Marital Status: Single    Spouse Name: N/A  . Number of Children: N/A  . Years of Education:  N/A   Social History Main Topics  . Smoking status: Current Some Day Smoker  . Smokeless tobacco: None  . Alcohol Use: Yes     Comment: occasional  . Drug Use: No  . Sexual Activity: Not Asked   Other Topics Concern  . None   Social History Narrative    Hospital Course:  [name] was admitted for Adjustment disorder with mixed anxiety and depressed mood and crisis management.  He was treated with the following medications Vistaril 25 mg Tid prn for anxiety and Trazodone 50 mg Q hs prn for insomnia.  [name] was discharged with current medication and was instructed on how to take medications as prescribed; (details listed below under Medication List).  Medical problems were identified and treated as needed.  Home medications were restarted as appropriate.  Improvement was monitored by observation and [name] daily report of symptom reduction.  Emotional and mental status was monitored by daily self-inventory reports completed by [name] and clinical staff.  Patient attended group sessions and was cooperative; but continued to minimize his actions and denied that he was suicidal       [name] was evaluated by the treatment team for stability and plans for continued recovery upon discharge.  [name] motivation was an integral factor for scheduling further treatment.  Employment, transportation, bed availability, health status, family support, and any  pending legal issues were also considered during his hospital stay.  He was offered further treatment options upon discharge including but not limited to Residential, Intensive Outpatient, and Outpatient treatment.  [name] will follow up with the services as listed below under Follow Up Information.     Upon completion of this admission the [name] was both mentally and medically stable for discharge denying suicidal/homicidal ideation, auditory/visual/tactile hallucinations, delusional thoughts  and paranoia.       Physical Findings: AIMS: Facial and Oral Movements Muscles of Facial Expression: None, normal Lips and Perioral Area: None, normal Jaw: None, normal Tongue: None, normal,Extremity Movements Upper (arms, wrists, hands, fingers): None, normal Lower (legs, knees, ankles, toes): None, normal, Trunk Movements Neck, shoulders, hips: None, normal, Overall Severity Severity of abnormal movements (highest score from questions above): None, normal Incapacitation due to abnormal movements: None, normal Patient's awareness of abnormal movements (rate only patient's report): No Awareness, Dental Status Current problems with teeth and/or dentures?: No Does patient usually wear dentures?: No  CIWA:    COWS:     Musculoskeletal: Strength & Muscle Tone: within normal limits Gait & Station: normal Patient leans: N/A  Psychiatric Specialty Exam:  See Suicide Risk Assessment  Review of Systems  Psychiatric/Behavioral: Negative for depression, suicidal ideas and hallucinations. The patient is not nervous/anxious and does not have insomnia.   All other systems reviewed and are negative.   Blood pressure 125/75, pulse 64, temperature 98.1 F (36.7 C), temperature source Oral, resp. rate 20, height  (1.803 m), weight 77.565 kg (171 lb), SpO2 99 %.Body mass index is 23.86 kg/(m^2).  Have you used any form of tobacco in the last 30 days? (Cigarettes, Smokeless Tobacco, Cigars, and/or Pipes): Yes  Has this patient used any form of tobacco in the last 30 days? (Cigarettes, Smokeless Tobacco, Cigars, and/or Pipes) Yes, Yes, A prescription for an FDA-approved tobacco cessation medication was offered at discharge and the patient refused  Blood Alcohol level:  Lab Results  Component Value Date   ETH <5 04/03/2016    Metabolic Disorder Labs:  No results found for: HGBA1C, MPG No results found for: PROLACTIN Lab Results  Component Value Date   CHOL 175 04/05/2016   TRIG 86  04/05/2016   HDL 37* 04/05/2016   CHOLHDL 4.7 04/05/2016   VLDL 17 04/05/2016   LDLCALC 121* 04/05/2016    See Psychiatric Specialty Exam and Suicide Risk Assessment completed by Attending Physician prior to discharge.  Discharge destination:  Home  Is patient on multiple antipsychotic therapies at discharge:  No   Has Patient had three or more failed trials of antipsychotic monotherapy by history:  No  Recommended Plan for Multiple Antipsychotic Therapies: NA      Discharge Instructions    Activity as tolerated - No restrictions    Complete by:  As directed      Diet general    Complete by:  As directed      Discharge instructions    Complete by:  As directed   Take all of you medications as prescribed by your mental healthcare provider.  Report any adverse effects and reactions from your medications to your outpatient provider promptly.  Do not engage in alcohol and or illegal drug use while on prescription medicines. Keep all scheduled appointments. This is to ensure that you are getting refills on time and to avoid any interruption in your medication.  If you are unable to keep an appointment call to reschedule.  Be sure to follow up with resources and follow ups given. In the event of worsening symptoms call the crisis hotline, 911, and or go to the nearest emergency department for appropriate evaluation and treatment of symptoms. Follow-up with your primary care provider for your medical issues, concerns and or health care needs.            Medication List    TAKE these medications      Indication   hydrOXYzine 25 MG tablet  Commonly known as:  ATARAX/VISTARIL  Take 1 tablet (25 mg total) by mouth 3 (three) times daily.   Indication:  Anxiety     traZODone 50 MG tablet  Commonly known as:  DESYREL  Take 1 tablet (50 mg total) by mouth at bedtime as needed for sleep.   Indication:  Trouble Sleeping       Follow-up Information    Follow up with Pt declines  referral for outpatient services.      Follow-up recommendations:  Activity:  As tolerated Diet:  As tolerated  Comments:  KAITLIN ARDITO has been instructed to take medications as prescribed; and report adverse effects to outpatient provider.  Follow up with primary doctor for any medical issues and If symptoms recur report to nearest emergency or crisis hot line.    SignedAssunta Found, NP 04/08/2016, 4:53 PM

## 2016-04-06 NOTE — Progress Notes (Signed)
D: Pt d/c home as per MD's order. Cooperative with care and unit routines. Denies SI, HI, AVh and pain when assessed. Presents with animated affect and anxious mood. Pt verbalized understanding related to d/c instructions--prescriptions. Pt signed belonging sheet in agreement with items received. Denies concerns at this time.  A: D/C instructions reviewed with pt related prescriptions. All belongings in locker 36 returned to pt prior to departure from facility.  Q 15 minutes checks remains effective for safety on and off unit till time of discharge without issues to note at this time.  R: Pt receptive to care. Denies adverse drug reactions when assessed. No physical distress evident at time of d/c.

## 2016-04-06 NOTE — [facility] Suicide Risk Assessment (Signed)
[facility] INPATIENT:  Family/Significant Other Suicide Prevention Education  Suicide Prevention Education:  Patient Refusal for Family/Significant Other Suicide Prevention Education: The patient [name] has refused to provide written consent for family/significant other to be provided Family/Significant Other Suicide Prevention Education during admission and/or prior to discharge.  Physician notified.  No contact information was available for Pt's brother [name]. SPE provided to Pt.  [name], [name] 04/06/2016, 9:39 AM

## 2016-04-06 NOTE — [facility] Suicide Risk Assessment (Signed)
[facility]   Principal Problem: Adjustment disorder with mixed anxiety and depressed mood Discharge Diagnoses:  Patient Active Problem List   Diagnosis Date Noted  . Adjustment disorder with mixed anxiety and depressed mood [F43.23] 04/06/2016  . Attention deficit hyperactivity disorder (ADHD) [F90.9] 04/06/2016  . Suicide ideation [R45.851] 04/04/2016    Total Time spent with patient: 30 minutes  Musculoskeletal: Strength & Muscle Tone: within normal limits Gait & Station: normal Patient leans: N/A  Psychiatric Specialty Exam: Review of Systems  Psychiatric/Behavioral: Negative for depression, suicidal ideas and hallucinations. The patient is not nervous/anxious.   All other systems reviewed and are negative.   Blood pressure 125/75, pulse 64, temperature 98.1 F (36.7 C), temperature source Oral, resp. rate 20, height 5\' 11"  (1.803 m), weight 77.565 kg (171 lb), SpO2 99 %.Body mass index is 23.86 kg/(m^2).  General Appearance: Casual  Eye Contact::  Fair  Speech:  Clear and Coherent409  Volume:  Normal  Mood:  Euthymic  Affect:  Appropriate  Thought Process:  Coherent  Orientation:  Full (Time, Place, and Person)  Thought Content:  WDL  Suicidal Thoughts:  No  Homicidal Thoughts:  No  Memory:  Immediate;   Fair Recent;   Fair Remote;   Fair  Judgement:  Fair  Insight:  Shallow  Psychomotor Activity:  Normal  Concentration:  Fair  Recall:  FiservFair  Fund of Knowledge:Fair  Language: Fair  Akathisia:  No  Handed:  Right  AIMS (if indicated):     Assets:  Desire for Improvement  Sleep:  Number of Hours: 5.75  Cognition: WNL  ADL's:  Intact   Mental Status Per Nursing Assessment::   On Admission:  Suicidal ideation indicated by others  Demographic Factors:  Male and Caucasian  Loss Factors: NA  Historical Factors: Impulsivity  Risk Reduction Factors:   Positive social support  Continued Clinical Symptoms:  Previous  Psychiatric Diagnoses and Treatments  Cognitive Features That Contribute To Risk:  None    Suicide Risk:  Minimal: No identifiable suicidal ideation.  Patients presenting with no risk factors but with morbid ruminations; may be classified as minimal risk based on the severity of the depressive symptoms  Follow-up Information    Follow up with Pt declines referral for outpatient services.      Plan Of Care/Follow-up recommendations:  Activity:  no restrictions Diet:  regular Tests:  as needed Other:  follow up with aftercare  Timisha Mondry, MD 04/06/2016, 9:48 AM

## 2016-04-06 NOTE — Tx Team (Signed)
Interdisciplinary Treatment Plan Update (Adult) Date: 04/06/2016   Date: 04/06/2016 9:35 AM  Progress in Treatment:  Attending groups: Yes  Participating in groups: Yes Taking medication as prescribed: Yes  Tolerating medication: Yes  Family/Significant othe contact made: No, Pt does not have access to brother's phone number Patient understands diagnosis: Yes AEB seeking help with depression Discussing patient identified problems/goals with staff: Yes  Medical problems stabilized or resolved: Yes  Denies suicidal/homicidal ideation: Yes Patient has not harmed self or Others: Yes   New problem(s) identified: None identified at this time.   Discharge Plan or Barriers: CSW will assess for appropriate discharge plan and relevant barriers.   04/06/2016: Pt will discharge home; declines referral for outpatient services  Additional comments:  Patient and CSW reviewed pt's identified goals and treatment plan. Patient verbalized understanding and agreed to treatment plan. CSW reviewed [facility] "Discharge Process and Patient Involvement" Form. Pt verbalized understanding of information provided and signed form.   Reason for Continuation of Hospitalization:  Depression Medication stabilization Suicidal ideation  Estimated length of stay: 0 days  Review of initial/current patient goals per problem list:   1.  Goal(s): Patient will participate in aftercare plan  Met:  Yes  Target date: 3-5 days from date of admission   As evidenced by: Patient will participate within aftercare plan AEB aftercare provider and housing plan at discharge being identified.  04/04/16: CSW to work with Pt to assess for appropriate discharge plan and faciliate appointments and referrals as needed prior to d/c. 04/06/2016: Pt will discharge home; declines referral for outpatient services  2.  Goal (s): Patient will exhibit decreased depressive symptoms and suicidal ideations.  Met:  Yes  Target date: 3-5 days from  date of admission   As evidenced by: Patient will utilize self rating of depression at 3 or below and demonstrate decreased signs of depression or be deemed stable for discharge by MD. 04/04/16: Pt was admitted with symptoms of depression, rating 10/10. Pt continues to present with flat affect and depressive symptoms.  Pt will demonstrate decreased symptoms of depression and rate depression at 3/10 or lower prior to discharge. 04/06/2016: Pt rates depression at 0/10; denies SI  Attendees:  Patient:    Family:    Physician: Dr. Shea Evans, MD  04/06/2016 9:35 AM  Nursing:  04/06/2016 9:35 AM  Clinical Social Worker Peri Maris, [place] 04/06/2016 9:35 AM  Other: Tilden Fossa, LCSWA 04/06/2016 9:35 AM  Clinical: Lilian Coma, RN 04/06/2016 9:35 AM  Other: , RN Charge Nurse 04/06/2016 9:35 AM  Other: Hilda Lias, [place], [place] Social Work [phone]

## 2017-02-08 ENCOUNTER — Encounter (HOSPITAL_COMMUNITY): Payer: Self-pay | Admitting: Emergency Medicine

## 2017-02-08 ENCOUNTER — Emergency Department (HOSPITAL_COMMUNITY)
Admission: EM | Admit: 2017-02-08 | Discharge: 2017-02-08 | Disposition: A | Discharge status: Home / Self Care | Payer: Medicaid Other | Attending: Emergency Medicine | Admitting: Emergency Medicine

## 2017-02-08 DIAGNOSIS — R52 Pain, unspecified: Secondary | ICD-10-CM | POA: Insufficient documentation

## 2017-02-08 DIAGNOSIS — R05 Cough: Secondary | ICD-10-CM | POA: Diagnosis not present

## 2017-02-08 DIAGNOSIS — Z79899 Other long term (current) drug therapy: Secondary | ICD-10-CM | POA: Diagnosis not present

## 2017-02-08 DIAGNOSIS — J45909 Unspecified asthma, uncomplicated: Secondary | ICD-10-CM | POA: Insufficient documentation

## 2017-02-08 DIAGNOSIS — R509 Fever, unspecified: Secondary | ICD-10-CM | POA: Insufficient documentation

## 2017-02-08 DIAGNOSIS — R69 Illness, unspecified: Secondary | ICD-10-CM

## 2017-02-08 DIAGNOSIS — F17210 Nicotine dependence, cigarettes, uncomplicated: Secondary | ICD-10-CM | POA: Diagnosis not present

## 2017-02-08 DIAGNOSIS — R5383 Other fatigue: Secondary | ICD-10-CM | POA: Diagnosis not present

## 2017-02-08 DIAGNOSIS — J029 Acute pharyngitis, unspecified: Secondary | ICD-10-CM | POA: Insufficient documentation

## 2017-02-08 DIAGNOSIS — F909 Attention-deficit hyperactivity disorder, unspecified type: Secondary | ICD-10-CM | POA: Diagnosis not present

## 2017-02-08 DIAGNOSIS — J111 Influenza due to unidentified influenza virus with other respiratory manifestations: Secondary | ICD-10-CM

## 2017-02-08 LAB — RAPID STREP SCREEN (MED CTR MEBANE ONLY): Streptococcus, Group A Screen (Direct): NEGATIVE

## 2017-02-08 MED ORDER — OSELTAMIVIR PHOSPHATE 75 MG PO CAPS: 75.0000 mg | ORAL_CAPSULE | Freq: Two times a day (BID) | ORAL | 0 refills | Status: DC

## 2017-02-08 NOTE — ED Triage Notes (Signed)
PT c/o generalized body aches, fever/chills, sore throat and cough that started x2-3 days ago.

## 2017-02-08 NOTE — ED Provider Notes (Signed)
[facility]EMER[facility]ENCY DEPT Provider Note   CSN: [id number] Arrival date & time: 02/08/17  4098     History   Chief Complaint Chief Complaint  Patient presents with  . Generalized Body Aches    HPI [name] is a 36 y.o. male presenting with a 2 day history of flu like symptoms including subjective fever With intermittent chills, sore throat, generalized body aches and nonproductive cough.  He endorses his symptoms started acutely 2 days ago.  He denies any known contacts with influenza or strep throat.  He denies nausea, vomiting, abdominal pain, shortness of breath or wheezing.  He has been able to tolerate by mouth intake, stating he's been drinking lots of Gatorade for the past 2 days, denies any dysuria or reduced urinary frequency.  He last took ibuprofen 600 mg at 7 AM.  The history is provided by the patient.    Past Medical History:  Diagnosis Date  . ADHD (attention deficit hyperactivity disorder)   . Asthma     Patient Active Problem List   Diagnosis Date Noted  . Adjustment disorder with mixed anxiety and depressed mood 04/06/2016  . Attention deficit hyperactivity disorder (ADHD) 04/06/2016  . Suicide ideation 04/04/2016    Past Surgical History:  Procedure Laterality Date  . head surgery (as a baby)         Home Medications    Prior to Admission medications   Medication Sig Start Date End Date Taking? Authorizing Provider  hydrOXYzine (ATARAX/VISTARIL) 25 MG tablet Take 1 tablet (25 mg total) by mouth 3 (three) times daily. 04/06/16   Shuvon B Rankin, NP  oseltamivir (TAMIFLU) 75 MG capsule Take 1 capsule (75 mg total) by mouth every 12 (twelve) hours. 02/08/17   Burgess Amor, PA-C  traZODone (DESYREL) 50 MG tablet Take 1 tablet (50 mg total) by mouth at bedtime as needed for sleep. 04/06/16   Talmage Nap, NP    Family History History reviewed. No pertinent family history.  Social History Social History  Substance Use Topics  . Smoking status:  Current Some Day Smoker    Types: Cigarettes  . Smokeless tobacco: Never Used  . Alcohol use Yes     Comment: occasional     Allergies   Bee venom   Review of Systems Review of Systems  Constitutional: Positive for chills, fatigue and fever.  HENT: Positive for sore throat. Negative for congestion, ear pain, rhinorrhea, sinus pressure, trouble swallowing and voice change.   Eyes: Negative for discharge.  Respiratory: Positive for cough. Negative for shortness of breath, wheezing and stridor.   Cardiovascular: Negative for chest pain.  Gastrointestinal: Negative for abdominal pain.  Genitourinary: Negative.   Musculoskeletal: Positive for myalgias.  Skin: Negative for rash.  Neurological: Negative for dizziness and headaches.     Physical Exam Updated Vital Signs BP 138/90 (BP Location: Left Arm)   Pulse 100   Temp 99.9 F (37.7 C) (Oral)   Resp 18   Ht 5\' 10"  (1.778 m)   Wt 77.1 kg   SpO2 94%   BMI 24.39 kg/m   Physical Exam  Constitutional: He is oriented to person, place, and time. He appears well-developed and well-nourished.  HENT:  Head: Normocephalic and atraumatic.  Right Ear: Tympanic membrane and ear canal normal.  Left Ear: Tympanic membrane and ear canal normal.  Nose: Mucosal edema and rhinorrhea present.  Mouth/Throat: Uvula is midline and mucous membranes are normal. Mucous membranes are not dry. Posterior oropharyngeal erythema present. No  oropharyngeal exudate, posterior oropharyngeal edema or tonsillar abscesses. Tonsils are 1+ on the right. Tonsils are 1+ on the left. No tonsillar exudate.  Eyes: Conjunctivae are normal.  Cardiovascular: Normal rate and normal heart sounds.   Pulmonary/Chest: Effort normal and breath sounds normal. No respiratory distress. He has no wheezes. He has no rales.  Abdominal: Soft. There is no tenderness. There is no guarding.  Musculoskeletal: Normal range of motion.  Lymphadenopathy:    He has no cervical  adenopathy.  Neurological: He is alert and oriented to person, place, and time.  Skin: Skin is warm and dry. No rash noted.  Psychiatric: He has a normal mood and affect.     ED Treatments / Results  Labs (all labs ordered are listed, but only abnormal results are displayed) Labs Reviewed  RAPID STREP SCREEN (NOT AT [facility])  CULTURE, GROUP A STREP [facility]    EKG  EKG Interpretation None       Radiology No results found.  Procedures Procedures (including critical care time)  Medications Ordered in ED Medications - No data to display   Initial Impression / Assessment and Plan / ED Course  I have reviewed the triage vital signs and the nursing notes.  Pertinent labs & imaging results that were available during my care of the patient were reviewed by me and considered in my medical decision making (see chart for details).     Patient with probable influenza, no respiratory distress with normal exam except for low-grade fever and posterior pharyngeal erythema.  Will check strep.  If this is negative, will treat symptomatically.  Discussed tamiflu, pt wants.  Final Clinical Impressions(s) / ED Diagnoses   Final diagnoses:  Influenza-like illness    New Prescriptions New Prescriptions   OSELTAMIVIR (TAMIFLU) 75 MG CAPSULE    Take 1 capsule (75 mg total) by mouth every 12 (twelve) hours.     Burgess AmorJulie Jenay Morici, PA-C 02/08/17 0932    Eber HongBrian Miller, MD 02/08/17 [phone]

## 2017-02-08 NOTE — Discharge Instructions (Signed)
Rest,  Drink plenty of fluids.  Take motrin or tylenol for achiness and fever reduction.  You may take the tamiflu if you desire.  This medicine may improve your flu symptoms 1-2 days sooner.  Get rechecked if you have any worsening symptoms including increased shortness of breath,  uncontrolled fevers or increasing weakness.

## 2017-02-10 LAB — CULTURE, GROUP A STREP (THRC)

## 2018-03-24 ENCOUNTER — Encounter (HOSPITAL_COMMUNITY): Payer: Self-pay

## 2018-03-24 ENCOUNTER — Emergency Department (HOSPITAL_COMMUNITY)
Admission: EM | Admit: 2018-03-24 | Discharge: 2018-03-24 | Disposition: A | Discharge status: Home / Self Care | Payer: Medicaid Other | Attending: Emergency Medicine | Admitting: Emergency Medicine

## 2018-03-24 DIAGNOSIS — F909 Attention-deficit hyperactivity disorder, unspecified type: Secondary | ICD-10-CM | POA: Diagnosis not present

## 2018-03-24 DIAGNOSIS — J45909 Unspecified asthma, uncomplicated: Secondary | ICD-10-CM | POA: Insufficient documentation

## 2018-03-24 DIAGNOSIS — R079 Chest pain, unspecified: Secondary | ICD-10-CM | POA: Diagnosis not present

## 2018-03-24 DIAGNOSIS — Z79899 Other long term (current) drug therapy: Secondary | ICD-10-CM | POA: Diagnosis not present

## 2018-03-24 DIAGNOSIS — F1990 Other psychoactive substance use, unspecified, uncomplicated: Secondary | ICD-10-CM | POA: Insufficient documentation

## 2018-03-24 DIAGNOSIS — F17210 Nicotine dependence, cigarettes, uncomplicated: Secondary | ICD-10-CM | POA: Diagnosis not present

## 2018-03-24 LAB — CBC WITH DIFFERENTIAL/PLATELET
Basophils Absolute: 0 10*3/uL (ref 0.0–0.1)
Basophils Relative: 1 %
Eosinophils Absolute: 0 10*3/uL (ref 0.0–0.7)
Eosinophils Relative: 1 %
HEMATOCRIT: 46.7 % (ref 39.0–52.0)
HEMOGLOBIN: 15.6 g/dL (ref 13.0–17.0)
LYMPHS PCT: 23 %
Lymphs Abs: 1.7 10*3/uL (ref 0.7–4.0)
MCH: 28.3 pg (ref 26.0–34.0)
MCHC: 33.4 g/dL (ref 30.0–36.0)
MCV: 84.8 fL (ref 78.0–100.0)
MONOS PCT: 11 %
Monocytes Absolute: 0.8 10*3/uL (ref 0.1–1.0)
NEUTROS ABS: 4.8 10*3/uL (ref 1.7–7.7)
NEUTROS PCT: 66 %
Platelets: 282 10*3/uL (ref 150–400)
RBC: 5.51 MIL/uL (ref 4.22–5.81)
RDW: 13.4 % (ref 11.5–15.5)
WBC: 7.4 10*3/uL (ref 4.0–10.5)

## 2018-03-24 LAB — COMPREHENSIVE METABOLIC PANEL
ALK PHOS: 109 U/L (ref 38–126)
ALT: 21 U/L (ref 17–63)
AST: 13 U/L — AB (ref 15–41)
Albumin: 4.7 g/dL (ref 3.5–5.0)
Anion gap: 14 (ref 5–15)
BUN: 12 mg/dL (ref 6–20)
CALCIUM: 9.5 mg/dL (ref 8.9–10.3)
CO2: 24 mmol/L (ref 22–32)
CREATININE: 0.88 mg/dL (ref 0.61–1.24)
Chloride: 103 mmol/L (ref 101–111)
Glucose, Bld: 98 mg/dL (ref 65–99)
Potassium: 3.4 mmol/L — ABNORMAL LOW (ref 3.5–5.1)
Sodium: 141 mmol/L (ref 135–145)
Total Bilirubin: 2 mg/dL — ABNORMAL HIGH (ref 0.3–1.2)
Total Protein: 7.4 g/dL (ref 6.5–8.1)

## 2018-03-24 LAB — TROPONIN I: Troponin I: <0.03 ng/mL (ref <0.03)

## 2018-03-24 LAB — RAPID URINE DRUG SCREEN, HOSP PERFORMED
AMPHETAMINES: POSITIVE — AB
Barbiturates: NOT DETECTED
Benzodiazepines: NOT DETECTED
Cocaine: POSITIVE — AB
OPIATES: NOT DETECTED
Tetrahydrocannabinol: NOT DETECTED

## 2018-03-24 LAB — TSH: TSH: 2.775 u[IU]/mL (ref 0.350–4.500)

## 2018-03-24 LAB — LIPASE, BLOOD: LIPASE: 21 U/L (ref 11–51)

## 2018-03-24 MED ORDER — LORAZEPAM 2 MG/ML IJ SOLN
1.0000 mg | Freq: Once | INTRAMUSCULAR | Status: DC
  Filled 2018-03-24: qty 1

## 2018-03-24 MED ORDER — SODIUM CHLORIDE 0.9 % IV BOLUS
1000.0000 mL | Freq: Once | INTRAVENOUS | Status: AC
  Administered 2018-03-24: 1000 mL via INTRAVENOUS

## 2018-03-24 NOTE — Discharge Instructions (Addendum)
You were evaluated in the emergency department for chest pain and palpitations.  He felt like you may be were given something to make you feel this way.  Your testing showed that you are positive for amphetamines and cocaine.  We believe your symptoms will improve with some rest and you should keep well-hydrated.  If you feel any worsening symptoms she should return to the emergency department.
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
## 2018-03-24 NOTE — ED Notes (Signed)
Pt informed of need for urine sample. Pt states that he can't provide a sample at this time but states that he will as soon as he is able to do so.
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
## 2018-03-25 ENCOUNTER — Other Ambulatory Visit: Payer: Self-pay

## 2018-03-25 ENCOUNTER — Emergency Department (HOSPITAL_COMMUNITY)
Admission: EM | Admit: 2018-03-25 | Discharge: 2018-03-25 | Disposition: A | Discharge status: Home / Self Care | Payer: Medicaid Other | Attending: Emergency Medicine | Admitting: Emergency Medicine

## 2018-03-25 ENCOUNTER — Encounter (HOSPITAL_COMMUNITY): Payer: Self-pay | Admitting: Emergency Medicine

## 2018-03-25 DIAGNOSIS — R002 Palpitations: Secondary | ICD-10-CM

## 2018-03-25 DIAGNOSIS — Z79899 Other long term (current) drug therapy: Secondary | ICD-10-CM | POA: Insufficient documentation

## 2018-03-25 DIAGNOSIS — R079 Chest pain, unspecified: Secondary | ICD-10-CM | POA: Diagnosis present

## 2018-03-25 DIAGNOSIS — F17210 Nicotine dependence, cigarettes, uncomplicated: Secondary | ICD-10-CM | POA: Insufficient documentation

## 2018-03-25 DIAGNOSIS — R0789 Other chest pain: Secondary | ICD-10-CM

## 2018-03-25 DIAGNOSIS — J45909 Unspecified asthma, uncomplicated: Secondary | ICD-10-CM | POA: Insufficient documentation

## 2018-03-25 DIAGNOSIS — R1084 Generalized abdominal pain: Secondary | ICD-10-CM | POA: Insufficient documentation

## 2018-03-25 LAB — RAPID URINE DRUG SCREEN, HOSP PERFORMED
AMPHETAMINES: POSITIVE — AB
Barbiturates: NOT DETECTED
Benzodiazepines: NOT DETECTED
COCAINE: POSITIVE — AB
Opiates: NOT DETECTED
Tetrahydrocannabinol: NOT DETECTED

## 2018-03-25 LAB — I-STAT TROPONIN, ED: Troponin i, poc: 0.01 ng/mL (ref 0.00–0.08)

## 2018-03-25 NOTE — ED Notes (Signed)
EDP at bedside  

## 2018-03-25 NOTE — Discharge Instructions (Addendum)
Try to stay in a safe place. Only drink things that you have opened yourself or eat food that you have prepared or bought yourself.
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
## 2018-03-25 NOTE — ED Notes (Signed)
Pt states he cannot urinate at this time.
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
## 2018-06-25 DIAGNOSIS — I10 Essential (primary) hypertension: Secondary | ICD-10-CM | POA: Diagnosis not present

## 2018-06-25 DIAGNOSIS — R0689 Other abnormalities of breathing: Secondary | ICD-10-CM | POA: Diagnosis not present

## 2018-06-25 DIAGNOSIS — R0789 Other chest pain: Secondary | ICD-10-CM | POA: Diagnosis not present

## 2018-07-28 ENCOUNTER — Encounter (HOSPITAL_COMMUNITY): Payer: Self-pay | Admitting: Emergency Medicine

## 2018-07-28 ENCOUNTER — Other Ambulatory Visit: Payer: Self-pay

## 2018-07-28 DIAGNOSIS — L509 Urticaria, unspecified: Secondary | ICD-10-CM | POA: Diagnosis not present

## 2018-07-28 DIAGNOSIS — F17210 Nicotine dependence, cigarettes, uncomplicated: Secondary | ICD-10-CM | POA: Diagnosis not present

## 2018-07-28 DIAGNOSIS — Z79899 Other long term (current) drug therapy: Secondary | ICD-10-CM | POA: Diagnosis not present

## 2018-07-28 DIAGNOSIS — J45909 Unspecified asthma, uncomplicated: Secondary | ICD-10-CM | POA: Insufficient documentation

## 2018-07-28 DIAGNOSIS — R21 Rash and other nonspecific skin eruption: Secondary | ICD-10-CM | POA: Diagnosis present

## 2018-07-28 NOTE — ED Triage Notes (Signed)
Pt with hive-like rash on trunk of body, back, neck, arms. Pt noticed this on Saturday.

## 2018-07-29 ENCOUNTER — Emergency Department (HOSPITAL_COMMUNITY)
Admission: EM | Admit: 2018-07-29 | Discharge: 2018-07-29 | Disposition: A | Discharge status: Home / Self Care | Payer: Medicaid Other | Attending: Emergency Medicine | Admitting: Emergency Medicine

## 2018-07-29 DIAGNOSIS — L509 Urticaria, unspecified: Secondary | ICD-10-CM

## 2018-07-29 MED ORDER — PREDNISONE 10 MG PO TABS: 20.0000 mg | ORAL_TABLET | Freq: Two times a day (BID) | ORAL | 0 refills | Status: DC

## 2018-07-29 MED ORDER — HYDROXYZINE HCL 25 MG PO TABS: 25.0000 mg | ORAL_TABLET | Freq: Four times a day (QID) | ORAL | 0 refills | Status: DC

## 2018-07-29 MED ORDER — HYDROXYZINE HCL 25 MG PO TABS
25.0000 mg | ORAL_TABLET | Freq: Once | ORAL | Status: AC
  Administered 2018-07-29: 25 mg via ORAL
  Filled 2018-07-29: qty 1

## 2018-07-29 MED ORDER — PREDNISONE 20 MG PO TABS
40.0000 mg | ORAL_TABLET | Freq: Once | ORAL | Status: AC
  Administered 2018-07-29: 40 mg via ORAL
  Filled 2018-07-29: qty 2

## 2018-07-29 NOTE — Discharge Instructions (Addendum)
Prednisone and hydroxyzine as prescribed.  Return to the emergency department if you develop worsening rash, difficulty breathing or swallowing, or other new and concerning symptoms.

## 2018-07-29 NOTE — ED Provider Notes (Signed)
[facility] EMERGENCY DEPARTMENT Provider Note   CSN: [id number] Arrival date & time: 07/28/18  2322     History   Chief Complaint Chief Complaint  Patient presents with  . Rash    HPI Vernell LeepJerome D Petrucci is a 37 y.o. male.  Patient is a 37 year old male with history of ADHD and asthma.  He presents for evaluation of rash.  This started 2 days ago and is worsening.  He describes rash to his arms, torso, and legs.  He denies any new contacts or exposures.  He denies any difficulty breathing or throat swelling.  The history is provided by the patient.  Rash   This is a new problem. The current episode started 2 days ago. The problem has been rapidly worsening. The problem is associated with nothing. There has been no fever. The pain is moderate. The pain has been constant since onset. Associated symptoms include itching.    Past Medical History:  Diagnosis Date  . ADHD (attention deficit hyperactivity disorder)   . Asthma     Patient Active Problem List   Diagnosis Date Noted  . Adjustment disorder with mixed anxiety and depressed mood 04/06/2016  . Attention deficit hyperactivity disorder (ADHD) 04/06/2016  . Suicide ideation 04/04/2016    Past Surgical History:  Procedure Laterality Date  . head surgery (as a baby)          Home Medications    Prior to Admission medications   Medication Sig Start Date End Date Taking? Authorizing Provider  hydrOXYzine (ATARAX/VISTARIL) 25 MG tablet Take 1 tablet (25 mg total) by mouth 3 (three) times daily. 04/06/16   Rankin, Shuvon B, NP  oseltamivir (TAMIFLU) 75 MG capsule Take 1 capsule (75 mg total) by mouth every 12 (twelve) hours. 02/08/17   Burgess AmorIdol, Julie, PA-C  traZODone (DESYREL) 50 MG tablet Take 1 tablet (50 mg total) by mouth at bedtime as needed for sleep. 04/06/16   Rankin, Shuvon B, NP    Family History No family history on file.  Social History Social History   Tobacco Use  . Smoking status: Current Some Day Smoker      Types: Cigarettes  . Smokeless tobacco: Never Used  Substance Use Topics  . Alcohol use: Yes    Comment: occasional  . Drug use: Yes    Types: Cocaine    Comment: 2 weeks ago     Allergies   Bee venom   Review of Systems Review of Systems  Skin: Positive for itching and rash.  All other systems reviewed and are negative.    Physical Exam Updated Vital Signs BP (!) 131/99 (BP Location: Right Arm)   Pulse 63   Temp 98.1 F (36.7 C) (Oral)   Resp 20   Ht 5\' 11"  (1.803 m)   Wt 78.9 kg   SpO2 97%   BMI 24.27 kg/m   Physical Exam  Constitutional: He is oriented to person, place, and time. He appears well-developed and well-nourished. No distress.  HENT:  Head: Normocephalic and atraumatic.  Mouth/Throat: Oropharynx is clear and moist.  Neck: Normal range of motion. Neck supple.  Cardiovascular: Normal rate and regular rhythm. Exam reveals no friction rub.  No murmur heard. Pulmonary/Chest: Effort normal and breath sounds normal. No respiratory distress. He has no wheezes. He has no rales.  Abdominal: Soft. Bowel sounds are normal. He exhibits no distension. There is no tenderness.  Musculoskeletal: Normal range of motion. He exhibits no edema.  Neurological: He is alert and  oriented to person, place, and time. Coordination normal.  Skin: Skin is warm and dry. He is not diaphoretic.  There is an urticarial rash present to the torso, arms, and legs.  Nursing note and vitals reviewed.    ED Treatments / Results  Labs (all labs ordered are listed, but only abnormal results are displayed) Labs Reviewed - No data to display  EKG None  Radiology No results found.  Procedures Procedures (including critical care time)  Medications Ordered in ED Medications  predniSONE (DELTASONE) tablet 40 mg (has no administration in time range)  hydrOXYzine (ATARAX/VISTARIL) tablet 25 mg (has no administration in time range)     Initial Impression / Assessment and Plan  / ED Course  I have reviewed the triage vital signs and the nursing notes.  Pertinent labs & imaging results that were available during my care of the patient were reviewed by me and considered in my medical decision making (see chart for details).  This appears to be hives.  He will be treated with prednisone and hydroxyzine.  To follow-up as needed.  Final Clinical Impressions(s) / ED Diagnoses   Final diagnoses:  None    ED Discharge Orders    None       Geoffery Lyonselo, Yug Loria, MD 07/29/18 [phone]

## 2018-10-31 ENCOUNTER — Emergency Department (HOSPITAL_COMMUNITY)
Admission: EM | Admit: 2018-10-31 | Discharge: 2018-10-31 | Disposition: A | Discharge status: Home / Self Care | Payer: Medicaid Other | Attending: Emergency Medicine | Admitting: Emergency Medicine

## 2018-10-31 ENCOUNTER — Emergency Department (HOSPITAL_COMMUNITY): Payer: Medicaid Other

## 2018-10-31 ENCOUNTER — Other Ambulatory Visit: Payer: Self-pay

## 2018-10-31 ENCOUNTER — Encounter (HOSPITAL_COMMUNITY): Payer: Self-pay | Admitting: Emergency Medicine

## 2018-10-31 DIAGNOSIS — J069 Acute upper respiratory infection, unspecified: Secondary | ICD-10-CM

## 2018-10-31 DIAGNOSIS — R079 Chest pain, unspecified: Secondary | ICD-10-CM | POA: Diagnosis not present

## 2018-10-31 DIAGNOSIS — J45909 Unspecified asthma, uncomplicated: Secondary | ICD-10-CM | POA: Diagnosis not present

## 2018-10-31 DIAGNOSIS — Z79899 Other long term (current) drug therapy: Secondary | ICD-10-CM | POA: Diagnosis not present

## 2018-10-31 DIAGNOSIS — R05 Cough: Secondary | ICD-10-CM | POA: Insufficient documentation

## 2018-10-31 DIAGNOSIS — F17210 Nicotine dependence, cigarettes, uncomplicated: Secondary | ICD-10-CM | POA: Insufficient documentation

## 2018-10-31 DIAGNOSIS — R0989 Other specified symptoms and signs involving the circulatory and respiratory systems: Secondary | ICD-10-CM | POA: Insufficient documentation

## 2018-10-31 MED ORDER — ACETAMINOPHEN 325 MG PO TABS
650.0000 mg | ORAL_TABLET | Freq: Once | ORAL | Status: AC
  Administered 2018-10-31: 650 mg via ORAL
  Filled 2018-10-31: qty 2

## 2018-10-31 MED ORDER — BENZONATATE 100 MG PO CAPS: 100.0000 mg | ORAL_CAPSULE | Freq: Three times a day (TID) | ORAL | 0 refills | Status: DC | PRN

## 2018-10-31 MED ORDER — IBUPROFEN 400 MG PO TABS
400.0000 mg | ORAL_TABLET | Freq: Once | ORAL | Status: AC
Start: 2018-10-31 — End: 2018-10-31
  Administered 2018-10-31: 400 mg via ORAL
  Filled 2018-10-31: qty 1

## 2018-10-31 NOTE — ED Provider Notes (Signed)
[facility] EMERGENCY DEPARTMENT Provider Note   CSN: [id number] Arrival date & time: 10/31/18  [id number]     History   Chief Complaint Chief Complaint  Patient presents with  . URI    HPI [name] is a 37 y.o. male.  HPI  Pt was seen at 0800.  Per pt, c/o gradual onset and persistence of constant runny/stuffy nose, sinus congestion, and cough for the past 3 days.  Denies sore throat, no fevers, no rash, no CP/SOB, no N/V/D, no abd pain.    Past Medical History:  Diagnosis Date  . ADHD (attention deficit hyperactivity disorder)   . Asthma     Patient Active Problem List   Diagnosis Date Noted  . Adjustment disorder with mixed anxiety and depressed mood 04/06/2016  . Attention deficit hyperactivity disorder (ADHD) 04/06/2016  . Suicide ideation 04/04/2016    Past Surgical History:  Procedure Laterality Date  . head surgery (as a baby)          Home Medications    Prior to Admission medications   Medication Sig Start Date End Date Taking? Authorizing Provider  hydrOXYzine (ATARAX/VISTARIL) 25 MG tablet Take 1 tablet (25 mg total) by mouth every 6 (six) hours. 07/29/18   Geoffery Lyonselo, Douglas, MD  oseltamivir (TAMIFLU) 75 MG capsule Take 1 capsule (75 mg total) by mouth every 12 (twelve) hours. 02/08/17   Burgess AmorIdol, Julie, PA-C  predniSONE (DELTASONE) 10 MG tablet Take 2 tablets (20 mg total) by mouth 2 (two) times daily. 07/29/18   Geoffery Lyonselo, Douglas, MD  traZODone (DESYREL) 50 MG tablet Take 1 tablet (50 mg total) by mouth at bedtime as needed for sleep. 04/06/16   Rankin, Rada HayShuvon B, NP    Family History History reviewed. No pertinent family history.  Social History Social History   Tobacco Use  . Smoking status: Current Some Day Smoker    Types: Cigarettes  . Smokeless tobacco: Never Used  Substance Use Topics  . Alcohol use: Yes    Comment: occasional  . Drug use: Yes    Types: Cocaine    Comment: months ago     Allergies   Bee venom   Review of  Systems Review of Systems ROS: Statement: All systems negative except as marked or noted in the HPI; Constitutional: Negative for fever and chills. ; ; Eyes: Negative for eye pain, redness and discharge. ; ; ENMT: Negative for ear pain, hoarseness, sore throat. +nasal congestion, sinus pressure and rhinorrhea. ; ; Cardiovascular: Negative for chest pain, palpitations, diaphoresis, dyspnea and peripheral edema. ; ; Respiratory: +cough. Negative for wheezing and stridor. ; ; Gastrointestinal: Negative for nausea, vomiting, diarrhea, abdominal pain, blood in stool, hematemesis, jaundice and rectal bleeding. . ; ; Genitourinary: Negative for dysuria, flank pain and hematuria. ; ; Musculoskeletal: Negative for back pain and neck pain. Negative for swelling and trauma.; ; Skin: Negative for pruritus, rash, abrasions, blisters, bruising and skin lesion.; ; Neuro: Negative for headache, lightheadedness and neck stiffness. Negative for weakness, altered level of consciousness, altered mental status, extremity weakness, paresthesias, involuntary movement, seizure and syncope.       Physical Exam Updated Vital Signs BP (!) 163/94 (BP Location: Left Arm)   Pulse 76   Temp 98.3 F (36.8 C) (Oral)   Resp 18   Ht 5\' 5"  (1.651 m)   Wt 86.2 kg   SpO2 98%   BMI 31.62 kg/m   Physical Exam 0805: Physical examination:  Nursing notes reviewed; Vital signs and O2  SAT reviewed;  Constitutional: Well developed, Well nourished, Well hydrated, In no acute distress; Head:  Normocephalic, atraumatic; Eyes: EOMI, PERRL, No scleral icterus; ENMT: TM's clear bilat. +edemetous nasal turbinates bilat with clear rhinorrhea. Mouth and pharynx without lesions. No tonsillar exudates. No intra-oral edema. No submandibular or sublingual edema. No hoarse voice, no drooling, no stridor. No pain with manipulation of larynx. No trismus. Mouth and pharynx normal, Mucous membranes moist; Neck: Supple, Full range of motion, No  lymphadenopathy; Cardiovascular: Regular rate and rhythm, No gallop; Respiratory: Breath sounds clear & equal bilaterally, No wheezes.  Speaking full sentences with ease, Normal respiratory effort/excursion; Chest: Nontender, Movement normal; Abdomen: Soft, Nontender, Nondistended, Normal bowel sounds;; Extremities: Peripheral pulses normal, No tenderness, No edema, No calf edema or asymmetry.; Neuro: AA&Ox3, Major CN grossly intact.  Speech clear. No gross focal motor or sensory deficits in extremities.; Skin: Color normal, Warm, Dry.   ED Treatments / Results  Labs (all labs ordered are listed, but only abnormal results are displayed)   EKG None  Radiology   Procedures Procedures (including critical care time)  Medications Ordered in ED Medications  ibuprofen (ADVIL,MOTRIN) tablet 400 mg (has no administration in time range)  acetaminophen (TYLENOL) tablet 650 mg (has no administration in time range)     Initial Impression / Assessment and Plan / ED Course  I have reviewed the triage vital signs and the nursing notes.  Pertinent labs & imaging results that were available during my care of the patient were reviewed by me and considered in my medical decision making (see chart for details).  MDM Reviewed: previous chart, nursing note and vitals Interpretation: x-ray   Dg Chest 2 View Result Date: 10/31/2018 CLINICAL DATA:  Productive cough with fever and chest pain for 3 days. Occasional smoker. EXAM: CHEST - 2 VIEW COMPARISON:  None. FINDINGS: The heart size and mediastinal contours are normal. The lungs are clear. There is no pleural effusion or pneumothorax. No acute osseous findings are identified. IMPRESSION: No active cardiopulmonary process. Electronically Signed   By: Carey Bullocks M.D.   On: 10/31/2018 08:55    0930:  CXR reassuring. Pt denies sore throat. No fever in ED. Tx symptomatically at this time. Dx and testing d/w pt.  Questions answered.  Verb  understanding, agreeable to d/c home with outpt f/u.   Final Clinical Impressions(s) / ED Diagnoses   Final diagnoses:  None    ED Discharge Orders    None       Samuel Jester, DO 11/03/18 2213

## 2018-10-31 NOTE — Discharge Instructions (Signed)
Take over the counter decongestant (such as sudafed), as directed on packaging, for the next week.  Use over the counter normal saline nasal spray with frequent nose blowing, several times per day for the next 2 weeks. Take over the counter tylenol and ibuprofen, as directed on packaging, as needed for discomfort. Take the prescription as directed. Call your regular medical doctor today to schedule a follow up appointment within the next week.  Return to the Emergency Department immediately if worsening.

## 2018-10-31 NOTE — ED Triage Notes (Signed)
PT c/o nasal congestion, sore throat, productive green sputum cough and headaches x3 days.

## 2019-05-03 ENCOUNTER — Emergency Department (HOSPITAL_COMMUNITY)
Admission: EM | Admit: 2019-05-03 | Discharge: 2019-05-03 | Disposition: A | Discharge status: Home / Self Care | Payer: Medicaid Other | Attending: Emergency Medicine | Admitting: Emergency Medicine

## 2019-05-03 ENCOUNTER — Other Ambulatory Visit: Payer: Self-pay

## 2019-05-03 ENCOUNTER — Emergency Department (HOSPITAL_COMMUNITY): Payer: Medicaid Other

## 2019-05-03 ENCOUNTER — Encounter (HOSPITAL_COMMUNITY): Payer: Self-pay

## 2019-05-03 DIAGNOSIS — K920 Hematemesis: Secondary | ICD-10-CM | POA: Diagnosis not present

## 2019-05-03 DIAGNOSIS — R1084 Generalized abdominal pain: Secondary | ICD-10-CM | POA: Diagnosis not present

## 2019-05-03 DIAGNOSIS — R1110 Vomiting, unspecified: Secondary | ICD-10-CM | POA: Diagnosis not present

## 2019-05-03 DIAGNOSIS — R112 Nausea with vomiting, unspecified: Secondary | ICD-10-CM | POA: Diagnosis not present

## 2019-05-03 DIAGNOSIS — K2920 Alcoholic gastritis without bleeding: Secondary | ICD-10-CM | POA: Insufficient documentation

## 2019-05-03 DIAGNOSIS — Z79899 Other long term (current) drug therapy: Secondary | ICD-10-CM | POA: Insufficient documentation

## 2019-05-03 DIAGNOSIS — R0789 Other chest pain: Secondary | ICD-10-CM | POA: Insufficient documentation

## 2019-05-03 DIAGNOSIS — F17210 Nicotine dependence, cigarettes, uncomplicated: Secondary | ICD-10-CM | POA: Diagnosis not present

## 2019-05-03 DIAGNOSIS — R079 Chest pain, unspecified: Secondary | ICD-10-CM | POA: Diagnosis present

## 2019-05-03 DIAGNOSIS — J45909 Unspecified asthma, uncomplicated: Secondary | ICD-10-CM | POA: Diagnosis not present

## 2019-05-03 DIAGNOSIS — F909 Attention-deficit hyperactivity disorder, unspecified type: Secondary | ICD-10-CM | POA: Insufficient documentation

## 2019-05-03 DIAGNOSIS — R0989 Other specified symptoms and signs involving the circulatory and respiratory systems: Secondary | ICD-10-CM | POA: Diagnosis not present

## 2019-05-03 DIAGNOSIS — R52 Pain, unspecified: Secondary | ICD-10-CM | POA: Diagnosis not present

## 2019-05-03 DIAGNOSIS — R1111 Vomiting without nausea: Secondary | ICD-10-CM | POA: Diagnosis not present

## 2019-05-03 DIAGNOSIS — Z0389 Encounter for observation for other suspected diseases and conditions ruled out: Secondary | ICD-10-CM | POA: Diagnosis not present

## 2019-05-03 LAB — COMPREHENSIVE METABOLIC PANEL
ALT: 36 U/L (ref 0–44)
AST: 35 U/L (ref 15–41)
Albumin: 4.4 g/dL (ref 3.5–5.0)
Alkaline Phosphatase: 113 U/L (ref 38–126)
Anion gap: 9 (ref 5–15)
BUN: 17 mg/dL (ref 6–20)
CO2: 26 mmol/L (ref 22–32)
Calcium: 9.2 mg/dL (ref 8.9–10.3)
Chloride: 109 mmol/L (ref 98–111)
Creatinine, Ser: 0.88 mg/dL (ref 0.61–1.24)
GFR calc Af Amer: >60 mL/min (ref >60)
GFR calc non Af Amer: >60 mL/min (ref >60)
Glucose, Bld: 115 mg/dL — ABNORMAL HIGH (ref 70–99)
Potassium: 3.6 mmol/L (ref 3.5–5.1)
Sodium: 144 mmol/L (ref 135–145)
Total Bilirubin: 1 mg/dL (ref 0.3–1.2)
Total Protein: 6.9 g/dL (ref 6.5–8.1)

## 2019-05-03 LAB — CBC WITH DIFFERENTIAL/PLATELET
Abs Immature Granulocytes: 0.08 10*3/uL — ABNORMAL HIGH (ref 0.00–0.07)
Basophils Absolute: 0.1 10*3/uL (ref 0.0–0.1)
Basophils Relative: 1 %
Eosinophils Absolute: 0.1 10*3/uL (ref 0.0–0.5)
Eosinophils Relative: 1 %
HCT: 46.6 % (ref 39.0–52.0)
Hemoglobin: 15.3 g/dL (ref 13.0–17.0)
Immature Granulocytes: 1 %
Lymphocytes Relative: 18 %
Lymphs Abs: 1.6 10*3/uL (ref 0.7–4.0)
MCH: 28.5 pg (ref 26.0–34.0)
MCHC: 32.8 g/dL (ref 30.0–36.0)
MCV: 86.8 fL (ref 80.0–100.0)
Monocytes Absolute: 0.8 10*3/uL (ref 0.1–1.0)
Monocytes Relative: 9 %
Neutro Abs: 6.2 10*3/uL (ref 1.7–7.7)
Neutrophils Relative %: 70 %
Platelets: 298 10*3/uL (ref 150–400)
RBC: 5.37 MIL/uL (ref 4.22–5.81)
RDW: 12.6 % (ref 11.5–15.5)
WBC: 8.9 10*3/uL (ref 4.0–10.5)
nRBC: 0 % (ref 0.0–0.2)

## 2019-05-03 LAB — URINALYSIS, ROUTINE W REFLEX MICROSCOPIC
Bilirubin Urine: NEGATIVE
Glucose, UA: NEGATIVE mg/dL
Hgb urine dipstick: NEGATIVE
Ketones, ur: NEGATIVE mg/dL
Leukocytes,Ua: NEGATIVE
Nitrite: NEGATIVE
Protein, ur: NEGATIVE mg/dL
Specific Gravity, Urine: 1.021 (ref 1.005–1.030)
pH: 5 (ref 5.0–8.0)

## 2019-05-03 LAB — TROPONIN I
Troponin I: <0.03 ng/mL (ref <0.03)
Troponin I: <0.03 ng/mL (ref <0.03)

## 2019-05-03 LAB — RAPID URINE DRUG SCREEN, HOSP PERFORMED
Amphetamines: NOT DETECTED
Barbiturates: NOT DETECTED
Benzodiazepines: NOT DETECTED
Cocaine: NOT DETECTED
Opiates: NOT DETECTED
Tetrahydrocannabinol: NOT DETECTED

## 2019-05-03 LAB — ETHANOL: Alcohol, Ethyl (B): <10 mg/dL (ref <10)

## 2019-05-03 LAB — LIPASE, BLOOD: Lipase: 25 U/L (ref 11–51)

## 2019-05-03 MED ORDER — ONDANSETRON HCL 4 MG/2ML IJ SOLN
4.0000 mg | Freq: Once | INTRAMUSCULAR | Status: AC
  Administered 2019-05-03: 4 mg via INTRAVENOUS
  Filled 2019-05-03: qty 2

## 2019-05-03 MED ORDER — PANTOPRAZOLE SODIUM 40 MG IV SOLR
40.0000 mg | Freq: Once | INTRAVENOUS | Status: AC
  Administered 2019-05-03: 40 mg via INTRAVENOUS
  Filled 2019-05-03: qty 40

## 2019-05-03 MED ORDER — SUCRALFATE 1 G PO TABS: 1.0000 g | ORAL_TABLET | Freq: Three times a day (TID) | ORAL | 0 refills | Status: DC

## 2019-05-03 MED ORDER — OMEPRAZOLE 20 MG PO CPDR: 20.0000 mg | DELAYED_RELEASE_CAPSULE | Freq: Every day | ORAL | 0 refills | Status: DC

## 2019-05-03 MED ORDER — STERILE WATER FOR INJECTION IJ SOLN
INTRAMUSCULAR | Status: AC
  Administered 2019-05-03: 10 mL
  Filled 2019-05-03: qty 10

## 2019-05-03 MED ORDER — LIDOCAINE VISCOUS HCL 2 % MT SOLN
15.0000 mL | Freq: Once | OROMUCOSAL | Status: AC
  Administered 2019-05-03: 15 mL via ORAL
  Filled 2019-05-03: qty 15

## 2019-05-03 MED ORDER — ALUM & MAG HYDROXIDE-SIMETH 200-200-20 MG/5ML PO SUSP
30.0000 mL | Freq: Once | ORAL | Status: AC
  Administered 2019-05-03: 30 mL via ORAL
  Filled 2019-05-03: qty 30
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
## 2019-05-03 NOTE — Discharge Instructions (Addendum)
There was no evidence of heart attack or blood clot in the lung.  Take the stomach medication as prescribed.  Avoid alcohol, NSAID medications, caffeine, spicy foods.  Return to the ED if you develop new or worsening symptoms.
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
## 2019-05-03 NOTE — ED Notes (Signed)
Called ac for protonix  

## 2019-05-03 NOTE — ED Triage Notes (Signed)
Pt arrived via [facility] from a friends house following 2 episodes of emesis. Pt reports drinking at [facility] earlier in the evening and states he had 2 cups of vodka mixed with fruit punch. Pt reports hx of acid reflux and his chest pain moves back and forth between abdomen and chest.

## 2019-05-17 ENCOUNTER — Emergency Department (HOSPITAL_COMMUNITY)
Admission: EM | Admit: 2019-05-17 | Discharge: 2019-05-18 | Disposition: A | Discharge status: Home / Self Care | Payer: Medicaid Other | Attending: Emergency Medicine | Admitting: Emergency Medicine

## 2019-05-17 ENCOUNTER — Encounter (HOSPITAL_COMMUNITY): Payer: Self-pay | Admitting: Emergency Medicine

## 2019-05-17 ENCOUNTER — Other Ambulatory Visit: Payer: Self-pay

## 2019-05-17 DIAGNOSIS — F17210 Nicotine dependence, cigarettes, uncomplicated: Secondary | ICD-10-CM | POA: Diagnosis not present

## 2019-05-17 DIAGNOSIS — F909 Attention-deficit hyperactivity disorder, unspecified type: Secondary | ICD-10-CM | POA: Insufficient documentation

## 2019-05-17 DIAGNOSIS — J45909 Unspecified asthma, uncomplicated: Secondary | ICD-10-CM | POA: Insufficient documentation

## 2019-05-17 DIAGNOSIS — Z79899 Other long term (current) drug therapy: Secondary | ICD-10-CM | POA: Insufficient documentation

## 2019-05-17 DIAGNOSIS — R51 Headache: Secondary | ICD-10-CM | POA: Insufficient documentation

## 2019-05-17 DIAGNOSIS — R519 Headache, unspecified: Secondary | ICD-10-CM

## 2019-05-17 DIAGNOSIS — R52 Pain, unspecified: Secondary | ICD-10-CM | POA: Diagnosis not present

## 2019-05-17 MED ORDER — KETOROLAC TROMETHAMINE 30 MG/ML IJ SOLN
30.0000 mg | Freq: Once | INTRAMUSCULAR | Status: AC
  Administered 2019-05-17: 30 mg via INTRAVENOUS
  Filled 2019-05-17: qty 1

## 2019-05-17 MED ORDER — DIPHENHYDRAMINE HCL 50 MG/ML IJ SOLN
25.0000 mg | Freq: Once | INTRAMUSCULAR | Status: AC
  Administered 2019-05-17: 25 mg via INTRAVENOUS
  Filled 2019-05-17: qty 1

## 2019-05-17 MED ORDER — METOCLOPRAMIDE HCL 5 MG/ML IJ SOLN
10.0000 mg | Freq: Once | INTRAMUSCULAR | Status: AC
  Administered 2019-05-17: 10 mg via INTRAVENOUS
  Filled 2019-05-17: qty 2

## 2019-05-17 NOTE — ED Triage Notes (Signed)
Pt brought in by EMS for headache x 4 days. Pt states he has numbness from front of head to back and L. Shoulder.

## 2019-05-18 MED ORDER — IBUPROFEN 800 MG PO TABS: 800.0000 mg | ORAL_TABLET | Freq: Three times a day (TID) | ORAL | 0 refills | Status: DC | PRN

## 2019-05-18 NOTE — Discharge Instructions (Addendum)
Follow-up with your family doctor if any problem 

## 2019-05-18 NOTE — ED Provider Notes (Signed)
[facility] Provider Note   CSN: [id number] Arrival date & time: 05/17/19  2308    History   Chief Complaint Chief Complaint  Patient presents with  . Headache    HPI [name] is a 38 y.o. male.     Patient complains of a headache.  The history is provided by the patient. No language interpreter was used.  Headache  Pain location:  Frontal Quality:  Dull Radiates to:  Does not radiate Severity currently:  4/10 Severity at highest:  5/10 Onset quality:  Sudden Timing:  Intermittent Progression:  Waxing and waning Chronicity:  New Similar to prior headaches: yes   Associated symptoms: no abdominal pain, no back pain, no congestion, no cough, no diarrhea, no fatigue, no seizures and no sinus pressure     Past Medical History:  Diagnosis Date  . ADHD (attention deficit hyperactivity disorder)   . Asthma     Patient Active Problem List   Diagnosis Date Noted  . Adjustment disorder with mixed anxiety and depressed mood 04/06/2016  . Attention deficit hyperactivity disorder (ADHD) 04/06/2016  . Suicide ideation 04/04/2016    Past Surgical History:  Procedure Laterality Date  . head surgery (as a baby)          Home Medications    Prior to Admission medications   Medication Sig Start Date End Date Taking? Authorizing Provider  benzonatate (TESSALON) 100 MG capsule Take 1 capsule (100 mg total) by mouth 3 (three) times daily as needed for cough. 10/31/18   Samuel JesterMcManus, Kathleen, DO  hydrOXYzine (ATARAX/VISTARIL) 25 MG tablet Take 1 tablet (25 mg total) by mouth every 6 (six) hours. 07/29/18   Geoffery Lyonselo, Douglas, MD  ibuprofen (ADVIL) 800 MG tablet Take 1 tablet (800 mg total) by mouth every 8 (eight) hours as needed for headache. 05/18/19   Bethann BerkshireZammit, Arriel Victor, MD  omeprazole (PRILOSEC) 20 MG capsule Take 1 capsule (20 mg total) by mouth daily. 05/03/19   Rancour, Jeannett SeniorStephen, MD  oseltamivir (TAMIFLU) 75 MG capsule Take 1 capsule (75 mg total) by mouth  every 12 (twelve) hours. 02/08/17   Burgess AmorIdol, Julie, PA-C  predniSONE (DELTASONE) 10 MG tablet Take 2 tablets (20 mg total) by mouth 2 (two) times daily. 07/29/18   Geoffery Lyonselo, Douglas, MD  sucralfate (CARAFATE) 1 g tablet Take 1 tablet (1 g total) by mouth 4 (four) times daily -  with meals and at bedtime. 05/03/19   Rancour, Jeannett SeniorStephen, MD  traZODone (DESYREL) 50 MG tablet Take 1 tablet (50 mg total) by mouth at bedtime as needed for sleep. 04/06/16   Rankin, Shuvon B, NP    Family History No family history on file.  Social History Social History   Tobacco Use  . Smoking status: Current Some Day Smoker    Types: Cigarettes  . Smokeless tobacco: Never Used  Substance Use Topics  . Alcohol use: Yes    Comment: occasional  . Drug use: Yes    Types: Cocaine    Comment: months ago     Allergies   Bee venom   Review of Systems Review of Systems  Constitutional: Negative for appetite change and fatigue.  HENT: Negative for congestion, ear discharge and sinus pressure.   Eyes: Negative for discharge.  Respiratory: Negative for cough.   Cardiovascular: Negative for chest pain.  Gastrointestinal: Negative for abdominal pain and diarrhea.  Genitourinary: Negative for frequency and hematuria.  Musculoskeletal: Negative for back pain.  Skin: Negative for rash.  Neurological: Positive for  headaches. Negative for seizures.  Psychiatric/Behavioral: Negative for hallucinations.     Physical Exam Updated Vital Signs BP 126/68 (BP Location: Left Arm)   Pulse 72   Temp 98 F (36.7 C)   Resp 17   Ht 5\' 11"  (1.803 m)   Wt 78 kg   SpO2 100%   BMI 23.98 kg/m   Physical Exam Vitals signs and nursing note reviewed.  Constitutional:      Appearance: He is well-developed.  HENT:     Head: Normocephalic.     Nose: Nose normal.     Mouth/Throat:     Mouth: Mucous membranes are moist.  Eyes:     General: No scleral icterus.    Conjunctiva/sclera: Conjunctivae normal.  Neck:      Musculoskeletal: Neck supple.     Thyroid: No thyromegaly.  Cardiovascular:     Rate and Rhythm: Normal rate and regular rhythm.     Heart sounds: No murmur. No friction rub. No gallop.   Pulmonary:     Breath sounds: No stridor. No wheezing or rales.  Chest:     Chest wall: No tenderness.  Abdominal:     General: There is no distension.     Tenderness: There is no abdominal tenderness. There is no rebound.  Musculoskeletal: Normal range of motion.  Lymphadenopathy:     Cervical: No cervical adenopathy.  Skin:    Findings: No erythema or rash.  Neurological:     Mental Status: He is oriented to person, place, and time.     Motor: No abnormal muscle tone.     Coordination: Coordination normal.  Psychiatric:        Behavior: Behavior normal.      ED Treatments / Results  Labs (all labs ordered are listed, but only abnormal results are displayed) Labs Reviewed - No data to display  EKG None  Radiology No results found.  Procedures Procedures (including critical care time)  Medications Ordered in ED Medications  ketorolac (TORADOL) 30 MG/ML injection 30 mg (30 mg Intravenous Given 05/17/19 2349)  metoCLOPramide (REGLAN) injection 10 mg (10 mg Intravenous Given 05/17/19 2350)  diphenhydrAMINE (BENADRYL) injection 25 mg (25 mg Intravenous Given 05/17/19 2350)     Initial Impression / Assessment and Plan / ED Course  I have reviewed the triage vital signs and the nursing notes.  Pertinent labs & imaging results that were available during my care of the patient were reviewed by me and considered in my medical decision making (see chart for details).        Patient with a headache.  Patient improved with migraine cocktail.  He will be given some Motrin and follow-up as needed  Final Clinical Impressions(s) / ED Diagnoses   Final diagnoses:  Bad headache    ED Discharge Orders         Ordered    ibuprofen (ADVIL) 800 MG tablet  Every 8 hours PRN     05/18/19  0104           Bethann Berkshire, MD 05/18/19 0106

## 2019-05-23 ENCOUNTER — Emergency Department (HOSPITAL_COMMUNITY)
Admission: EM | Admit: 2019-05-23 | Discharge: 2019-05-23 | Disposition: A | Discharge status: Home / Self Care | Payer: Medicaid Other | Attending: Emergency Medicine | Admitting: Emergency Medicine

## 2019-05-23 ENCOUNTER — Encounter (HOSPITAL_COMMUNITY): Payer: Self-pay | Admitting: *Deleted

## 2019-05-23 ENCOUNTER — Other Ambulatory Visit: Payer: Self-pay

## 2019-05-23 DIAGNOSIS — R1011 Right upper quadrant pain: Secondary | ICD-10-CM | POA: Diagnosis not present

## 2019-05-23 DIAGNOSIS — R519 Headache, unspecified: Secondary | ICD-10-CM

## 2019-05-23 DIAGNOSIS — R51 Headache: Secondary | ICD-10-CM | POA: Diagnosis not present

## 2019-05-23 DIAGNOSIS — R079 Chest pain, unspecified: Secondary | ICD-10-CM | POA: Diagnosis not present

## 2019-05-23 DIAGNOSIS — J45909 Unspecified asthma, uncomplicated: Secondary | ICD-10-CM | POA: Insufficient documentation

## 2019-05-23 DIAGNOSIS — R202 Paresthesia of skin: Secondary | ICD-10-CM | POA: Diagnosis not present

## 2019-05-23 DIAGNOSIS — F17210 Nicotine dependence, cigarettes, uncomplicated: Secondary | ICD-10-CM | POA: Insufficient documentation

## 2019-05-23 MED ORDER — KETOROLAC TROMETHAMINE 60 MG/2ML IM SOLN
60.0000 mg | Freq: Once | INTRAMUSCULAR | Status: AC
  Administered 2019-05-23: 60 mg via INTRAMUSCULAR
  Filled 2019-05-23: qty 2

## 2019-05-23 MED ORDER — PROCHLORPERAZINE EDISYLATE 10 MG/2ML IJ SOLN
10.0000 mg | Freq: Once | INTRAMUSCULAR | Status: AC
  Administered 2019-05-23: 10 mg via INTRAMUSCULAR
  Filled 2019-05-23: qty 2
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
## 2019-05-27 ENCOUNTER — Emergency Department (HOSPITAL_COMMUNITY)
Admission: EM | Admit: 2019-05-27 | Discharge: 2019-05-27 | Disposition: A | Discharge status: Home / Self Care | Payer: Medicaid Other | Attending: Emergency Medicine | Admitting: Emergency Medicine

## 2019-05-27 ENCOUNTER — Other Ambulatory Visit: Payer: Self-pay

## 2019-05-27 ENCOUNTER — Encounter (HOSPITAL_COMMUNITY): Payer: Self-pay

## 2019-05-27 DIAGNOSIS — R51 Headache: Secondary | ICD-10-CM | POA: Diagnosis not present

## 2019-05-27 DIAGNOSIS — Z5321 Procedure and treatment not carried out due to patient leaving prior to being seen by health care provider: Secondary | ICD-10-CM | POA: Diagnosis not present

## 2019-05-27 DIAGNOSIS — G5602 Carpal tunnel syndrome, left upper limb: Secondary | ICD-10-CM | POA: Diagnosis not present

## 2019-05-27 DIAGNOSIS — G5622 Lesion of ulnar nerve, left upper limb: Secondary | ICD-10-CM | POA: Diagnosis not present

## 2019-05-27 DIAGNOSIS — G43909 Migraine, unspecified, not intractable, without status migrainosus: Secondary | ICD-10-CM | POA: Diagnosis not present

## 2019-05-27 NOTE — ED Triage Notes (Signed)
Pt presents to ED with c/o headaches and numbness. Pt states this has been on going and has been seen here multiple times for the same thing.

## 2019-08-03 ENCOUNTER — Emergency Department (HOSPITAL_COMMUNITY)
Admission: EM | Admit: 2019-08-03 | Discharge: 2019-08-03 | Disposition: A | Discharge status: Home / Self Care | Payer: Medicaid Other | Attending: Emergency Medicine | Admitting: Emergency Medicine

## 2019-08-03 ENCOUNTER — Other Ambulatory Visit: Payer: Self-pay

## 2019-08-03 ENCOUNTER — Encounter (HOSPITAL_COMMUNITY): Payer: Self-pay

## 2019-08-03 DIAGNOSIS — F17210 Nicotine dependence, cigarettes, uncomplicated: Secondary | ICD-10-CM | POA: Insufficient documentation

## 2019-08-03 DIAGNOSIS — J45909 Unspecified asthma, uncomplicated: Secondary | ICD-10-CM | POA: Insufficient documentation

## 2019-08-03 DIAGNOSIS — S80862A Insect bite (nonvenomous), left lower leg, initial encounter: Secondary | ICD-10-CM | POA: Insufficient documentation

## 2019-08-03 DIAGNOSIS — Y92002 Bathroom of unspecified non-institutional (private) residence single-family (private) house as the place of occurrence of the external cause: Secondary | ICD-10-CM | POA: Diagnosis not present

## 2019-08-03 DIAGNOSIS — Y999 Unspecified external cause status: Secondary | ICD-10-CM | POA: Diagnosis not present

## 2019-08-03 DIAGNOSIS — Z79899 Other long term (current) drug therapy: Secondary | ICD-10-CM | POA: Insufficient documentation

## 2019-08-03 DIAGNOSIS — F909 Attention-deficit hyperactivity disorder, unspecified type: Secondary | ICD-10-CM | POA: Diagnosis not present

## 2019-08-03 DIAGNOSIS — T63301A Toxic effect of unspecified spider venom, accidental (unintentional), initial encounter: Secondary | ICD-10-CM

## 2019-08-03 DIAGNOSIS — Y93E1 Activity, personal bathing and showering: Secondary | ICD-10-CM | POA: Insufficient documentation

## 2019-08-03 DIAGNOSIS — W57XXXA Bitten or stung by nonvenomous insect and other nonvenomous arthropods, initial encounter: Secondary | ICD-10-CM | POA: Insufficient documentation

## 2019-08-03 NOTE — Discharge Instructions (Addendum)
Keep the area clean with mild soap and water.  You may keep it bandaged if needed.  Apply over-the-counter hydrocortisone cream twice daily if needed for itching.  Follow-up with your primary doctor or return to the ER for any worsening symptoms such as fever, increasing redness or swelling at the site.

## 2019-08-03 NOTE — ED Provider Notes (Signed)
[facility] EMERGENCY DEPARTMENT Provider Note   CSN: [id number] Arrival date & time: 08/03/19  1949     History   Chief Complaint Chief Complaint  Patient presents with  . Insect Bite    Spider    HPI [name] is a 38 y.o. male.     HPI   [name] is a 38 y.o. male who presents to the Emergency Department requesting evaluation for a possible spider bite to his left lower leg that occurred just before ER arrival.  He states that he was taking a shower when he noticed a large spider in the shower and a red mark to his lower leg.  He believes the spider may have bit him although he did not witness it or feel a bite.  He currently describes a tingling numb sensation to his leg.  He denies pain or itching, swelling, bleeding, nausea or vomiting.   Past Medical History:  Diagnosis Date  . ADHD (attention deficit hyperactivity disorder)   . Asthma     Patient Active Problem List   Diagnosis Date Noted  . Adjustment disorder with mixed anxiety and depressed mood 04/06/2016  . Attention deficit hyperactivity disorder (ADHD) 04/06/2016  . Suicide ideation 04/04/2016    Past Surgical History:  Procedure Laterality Date  . head surgery (as a baby)          Home Medications    Prior to Admission medications   Medication Sig Start Date End Date Taking? Authorizing Provider  benzonatate (TESSALON) 100 MG capsule Take 1 capsule (100 mg total) by mouth 3 (three) times daily as needed for cough. 10/31/18   Francine Graven, DO  hydrOXYzine (ATARAX/VISTARIL) 25 MG tablet Take 1 tablet (25 mg total) by mouth every 6 (six) hours. 07/29/18   Veryl Speak, MD  ibuprofen (ADVIL) 800 MG tablet Take 1 tablet (800 mg total) by mouth every 8 (eight) hours as needed for headache. 05/18/19   Milton Ferguson, MD  omeprazole (PRILOSEC) 20 MG capsule Take 1 capsule (20 mg total) by mouth daily. 05/03/19   Rancour, Annie Main, MD  oseltamivir (TAMIFLU) 75 MG capsule Take 1 capsule (75 mg  total) by mouth every 12 (twelve) hours. 02/08/17   Evalee Jefferson, PA-C  predniSONE (DELTASONE) 10 MG tablet Take 2 tablets (20 mg total) by mouth 2 (two) times daily. 07/29/18   Veryl Speak, MD  sucralfate (CARAFATE) 1 g tablet Take 1 tablet (1 g total) by mouth 4 (four) times daily -  with meals and at bedtime. 05/03/19   Rancour, Annie Main, MD  traZODone (DESYREL) 50 MG tablet Take 1 tablet (50 mg total) by mouth at bedtime as needed for sleep. 04/06/16   Rankin, Mercy Moore, NP    Family History History reviewed. No pertinent family history.  Social History Social History   Tobacco Use  . Smoking status: Current Some Day Smoker    Types: Cigarettes  . Smokeless tobacco: Never Used  Substance Use Topics  . Alcohol use: Yes    Comment: occasional  . Drug use: Yes    Types: Cocaine    Comment: months ago     Allergies   Bee venom   Review of Systems Review of Systems  Constitutional: Negative for chills and fever.  Respiratory: Negative for shortness of breath.   Cardiovascular: Negative for chest pain.  Gastrointestinal: Negative for abdominal pain, nausea and vomiting.  Musculoskeletal: Negative for arthralgias, joint swelling and myalgias.  Skin: Positive for wound. Negative for  color change.  Neurological: Positive for numbness (Numbness and tingling with pins-and-needles sensation of the left lower leg).  All other systems reviewed and are negative.    Physical Exam Updated Vital Signs BP (!) 158/102 (BP Location: Left Arm)   Pulse 71   Temp 98.4 F (36.9 C) (Oral)   Resp 16   Ht 5\' 11"  (1.803 m)   Wt 89.8 kg   SpO2 100%   BMI 27.62 kg/m   Physical Exam Vitals signs and nursing note reviewed.  Constitutional:      Appearance: He is not ill-appearing or toxic-appearing.     Comments: Patient is very anxious appearing  HENT:     Head: Atraumatic.  Cardiovascular:     Rate and Rhythm: Normal rate.     Pulses: Normal pulses.  Pulmonary:     Effort: Pulmonary  effort is normal.  Abdominal:     Palpations: Abdomen is soft.     Tenderness: There is no abdominal tenderness.  Musculoskeletal: Normal range of motion.        General: No swelling.  Skin:    General: Skin is warm.     Capillary Refill: Capillary refill takes less than 2 seconds.     Findings: No erythema or rash.     Comments: Patient has several scratches grouped at the anterior left lower leg.  No open wound or punctures.  No swelling or erythema.  Neurological:     General: No focal deficit present.     Mental Status: He is alert.     Sensory: Sensation is intact. No sensory deficit.     Motor: Motor function is intact. No weakness.     Coordination: Coordination is intact.     Gait: Gait is intact.      ED Treatments / Results  Labs (all labs ordered are listed, but only abnormal results are displayed) Labs Reviewed - No data to display  EKG None  Radiology No results found.  Procedures Procedures (including critical care time)  Medications Ordered in ED Medications - No data to display   Initial Impression / Assessment and Plan / ED Course  I have reviewed the triage vital signs and the nursing notes.  Pertinent labs & imaging results that were available during my care of the patient were reviewed by me and considered in my medical decision making (see chart for details).        Patient here requesting evaluation of a possible spider bite to his left lower leg.  He describes paresthesias to his leg, he is ambulatory and gait is steady.  He has several scratches to the lower leg without any evidence of puncture wounds or insect bites.  Patient does have a large spider in a Ziploc bag.  Spider is not a brown recluse or black widow.  Patient is very anxious appearing.  Clinical suspicion for spider bite is low, patient reassured.  Advised to closely observe and return for any developing symptoms.  Patient agrees to plan.  Final Clinical Impressions(s) / ED  Diagnoses   Final diagnoses:  Spider bite wound, accidental or unintentional, initial encounter    ED Discharge Orders    None       Pauline Ausriplett, Nilda Keathley, PA-C 08/04/19 1650    Mancel BaleWentz, Elliott, MD 08/05/19 1211

## 2019-08-03 NOTE — ED Triage Notes (Signed)
Pt brought spider in bag

## 2019-08-03 NOTE — ED Triage Notes (Signed)
Pt bit by spider on left leg just PTA. Said that now is leg is numb.

## 2019-11-19 ENCOUNTER — Other Ambulatory Visit: Payer: Self-pay

## 2019-11-19 DIAGNOSIS — Z20822 Contact with and (suspected) exposure to covid-19: Secondary | ICD-10-CM

## 2019-11-19 DIAGNOSIS — Z20828 Contact with and (suspected) exposure to other viral communicable diseases: Secondary | ICD-10-CM | POA: Diagnosis not present

## 2019-11-21 LAB — NOVEL CORONAVIRUS, NAA: SARS-CoV-2, NAA: NOT DETECTED

## 2019-12-14 ENCOUNTER — Encounter (HOSPITAL_COMMUNITY): Payer: Self-pay | Admitting: Emergency Medicine

## 2019-12-14 ENCOUNTER — Emergency Department (HOSPITAL_COMMUNITY)
Admission: EM | Admit: 2019-12-14 | Discharge: 2019-12-14 | Disposition: A | Discharge status: Home / Self Care | Payer: Medicaid Other | Attending: Emergency Medicine | Admitting: Emergency Medicine

## 2019-12-14 ENCOUNTER — Other Ambulatory Visit: Payer: Self-pay

## 2019-12-14 DIAGNOSIS — L03312 Cellulitis of back [any part except buttock]: Secondary | ICD-10-CM | POA: Diagnosis not present

## 2019-12-14 DIAGNOSIS — J45909 Unspecified asthma, uncomplicated: Secondary | ICD-10-CM | POA: Insufficient documentation

## 2019-12-14 DIAGNOSIS — F17210 Nicotine dependence, cigarettes, uncomplicated: Secondary | ICD-10-CM | POA: Insufficient documentation

## 2019-12-14 DIAGNOSIS — Z79899 Other long term (current) drug therapy: Secondary | ICD-10-CM | POA: Insufficient documentation

## 2019-12-14 DIAGNOSIS — R222 Localized swelling, mass and lump, trunk: Secondary | ICD-10-CM | POA: Diagnosis present

## 2019-12-14 MED ORDER — DOXYCYCLINE HYCLATE 100 MG PO CAPS: 100.0000 mg | ORAL_CAPSULE | Freq: Two times a day (BID) | ORAL | 0 refills | Status: DC

## 2019-12-14 MED ORDER — DOXYCYCLINE HYCLATE 100 MG PO TABS
100.0000 mg | ORAL_TABLET | Freq: Once | ORAL | Status: AC
  Administered 2019-12-14: 100 mg via ORAL
  Filled 2019-12-14: qty 1

## 2019-12-14 NOTE — Discharge Instructions (Addendum)
Tylenol or ibuprofen for pain, return to the emergency department for severe or worsening symptoms including increasing redness, swelling, fever.  Doxycycline twice a day for 10 days.

## 2019-12-14 NOTE — ED Triage Notes (Signed)
Pt c/o abscess to the right shoulder. Pt states a friend popped it for him today, but the area has gotten bigger per pt.

## 2019-12-14 NOTE — ED Provider Notes (Signed)
[facility] EMERGENCY DEPARTMENT Provider Note   CSN: [id number] Arrival date & time: 12/14/19  1818     History Chief Complaint  Patient presents with  . Insect Bite    [name] is a 38 y.o. male.  HPI    This patient is a very pleasant 38 year old male presenting with a area on his mid upper back which is swollen and red.  He reports that yesterday he had a pimple in that area which a friend of his popped but today it has become more red swollen and tender.  No fevers, no other complaints, symptoms are persistent, mild to moderate, gradually worsening.  Past Medical History:  Diagnosis Date  . ADHD (attention deficit hyperactivity disorder)   . Asthma     Patient Active Problem List   Diagnosis Date Noted  . Adjustment disorder with mixed anxiety and depressed mood 04/06/2016  . Attention deficit hyperactivity disorder (ADHD) 04/06/2016  . Suicide ideation 04/04/2016    Past Surgical History:  Procedure Laterality Date  . head surgery (as a baby)         No family history on file.  Social History   Tobacco Use  . Smoking status: Current Some Day Smoker    Types: Cigarettes  . Smokeless tobacco: Never Used  Substance Use Topics  . Alcohol use: Yes    Comment: occasional  . Drug use: Not Currently    Types: Cocaine    Comment: months ago    Home Medications Prior to Admission medications   Medication Sig Start Date End Date Taking? Authorizing Provider  benzonatate (TESSALON) 100 MG capsule Take 1 capsule (100 mg total) by mouth 3 (three) times daily as needed for cough. 10/31/18   Francine Graven, DO  doxycycline (VIBRAMYCIN) 100 MG capsule Take 1 capsule (100 mg total) by mouth 2 (two) times daily. 12/14/19   Noemi Chapel, MD  hydrOXYzine (ATARAX/VISTARIL) 25 MG tablet Take 1 tablet (25 mg total) by mouth every 6 (six) hours. 07/29/18   Veryl Speak, MD  ibuprofen (ADVIL) 800 MG tablet Take 1 tablet (800 mg total) by mouth every 8 (eight)  hours as needed for headache. 05/18/19   Milton Ferguson, MD  omeprazole (PRILOSEC) 20 MG capsule Take 1 capsule (20 mg total) by mouth daily. 05/03/19   Rancour, Annie Main, MD  oseltamivir (TAMIFLU) 75 MG capsule Take 1 capsule (75 mg total) by mouth every 12 (twelve) hours. 02/08/17   Evalee Jefferson, PA-C  predniSONE (DELTASONE) 10 MG tablet Take 2 tablets (20 mg total) by mouth 2 (two) times daily. 07/29/18   Veryl Speak, MD  sucralfate (CARAFATE) 1 g tablet Take 1 tablet (1 g total) by mouth 4 (four) times daily -  with meals and at bedtime. 05/03/19   Rancour, Annie Main, MD  traZODone (DESYREL) 50 MG tablet Take 1 tablet (50 mg total) by mouth at bedtime as needed for sleep. 04/06/16   Rankin, Shuvon B, NP    Allergies    Bee venom  Review of Systems   Review of Systems  Constitutional: Negative for fever.  Skin: Positive for rash.    Physical Exam Updated Vital Signs BP (!) 143/88 (BP Location: Right Arm)   Pulse 72   Temp 98.3 F (36.8 C) (Oral)   Resp 20   Ht 1.803 m (5\' 11" )   SpO2 97%   BMI 27.62 kg/m   Physical Exam Vitals and nursing note reviewed.  Constitutional:      Appearance: He is  well-developed. He is not diaphoretic.  HENT:     Head: Normocephalic and atraumatic.  Eyes:     General:        Right eye: No discharge.        Left eye: No discharge.     Conjunctiva/sclera: Conjunctivae normal.  Pulmonary:     Effort: Pulmonary effort is normal. No respiratory distress.  Skin:    General: Skin is warm and dry.     Findings: Rash present. No erythema.     Comments: 1 cm area of mild induration with redness, no fluctuance, very small, minimally tender located in the upper thoracic area just right of midline  Neurological:     Mental Status: He is alert.     Coordination: Coordination normal.     ED Results / Procedures / Treatments   Labs (all labs ordered are listed, but only abnormal results are displayed) Labs Reviewed - No data to  display  EKG None  Radiology No results found.  Procedures Procedures (including critical care time)  Medications Ordered in ED Medications  doxycycline (VIBRA-TABS) tablet 100 mg (has no administration in time range)    ED Course  I have reviewed the triage vital signs and the nursing notes.  Pertinent labs & imaging results that were available during my care of the patient were reviewed by me and considered in my medical decision making (see chart for details).    MDM Rules/Calculators/A&P                      This patient has a mild staph infection, no systemic symptoms, well-appearing, no indication for incision and drainage, very small, doxycycline will be started and indications for return will be given to the patient, stable for discharge, first dose given in the emergency department.  Final Clinical Impression(s) / ED Diagnoses Final diagnoses:  Cellulitis of back    Rx / DC Orders ED Discharge Orders         Ordered    doxycycline (VIBRAMYCIN) 100 MG capsule  2 times daily     12/14/19 2009           Eber Hong, MD 12/14/19 2010

## 2020-01-02 IMAGING — DX DG ABDOMEN ACUTE W/ 1V CHEST
3 series · 3 of 3 positions shown · non-contrast
Comparison: 10/31/2018

CLINICAL DATA: Emesis

EXAM:
DG ABDOMEN ACUTE W/ 1V CHEST

[chest pa]
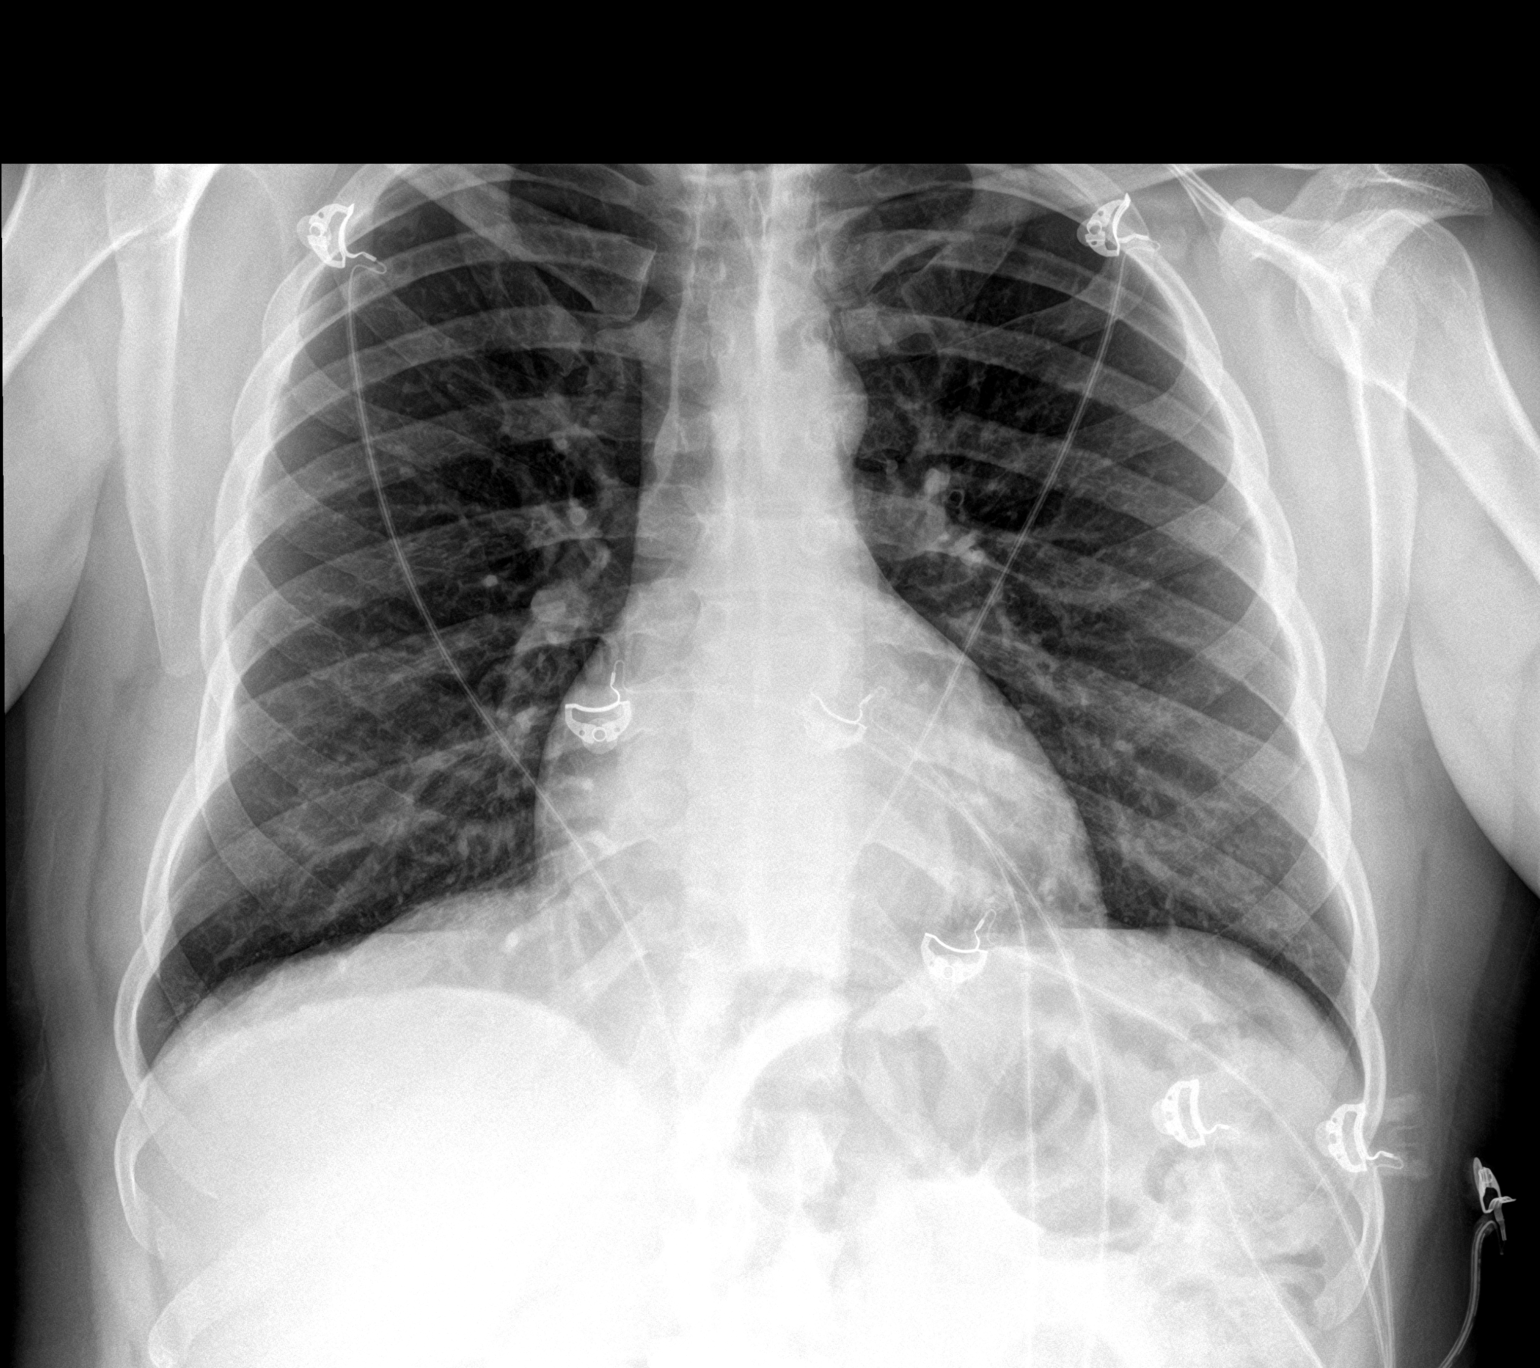

[abdomen erect]
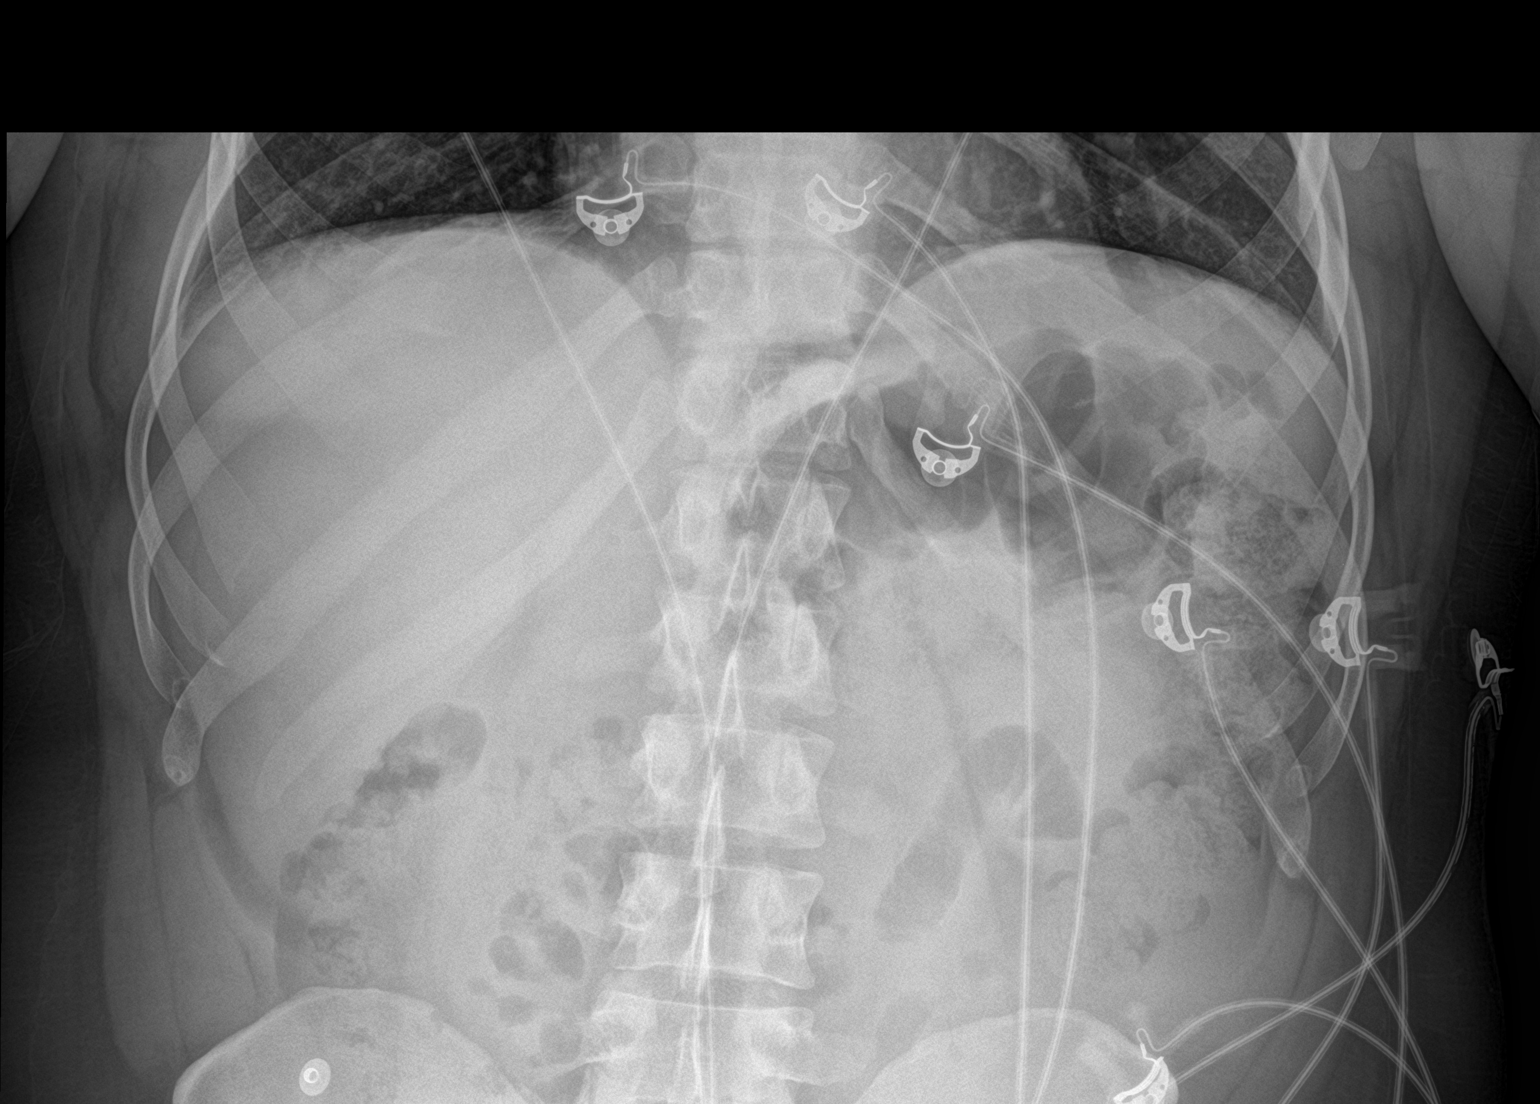

[abdomen supine]
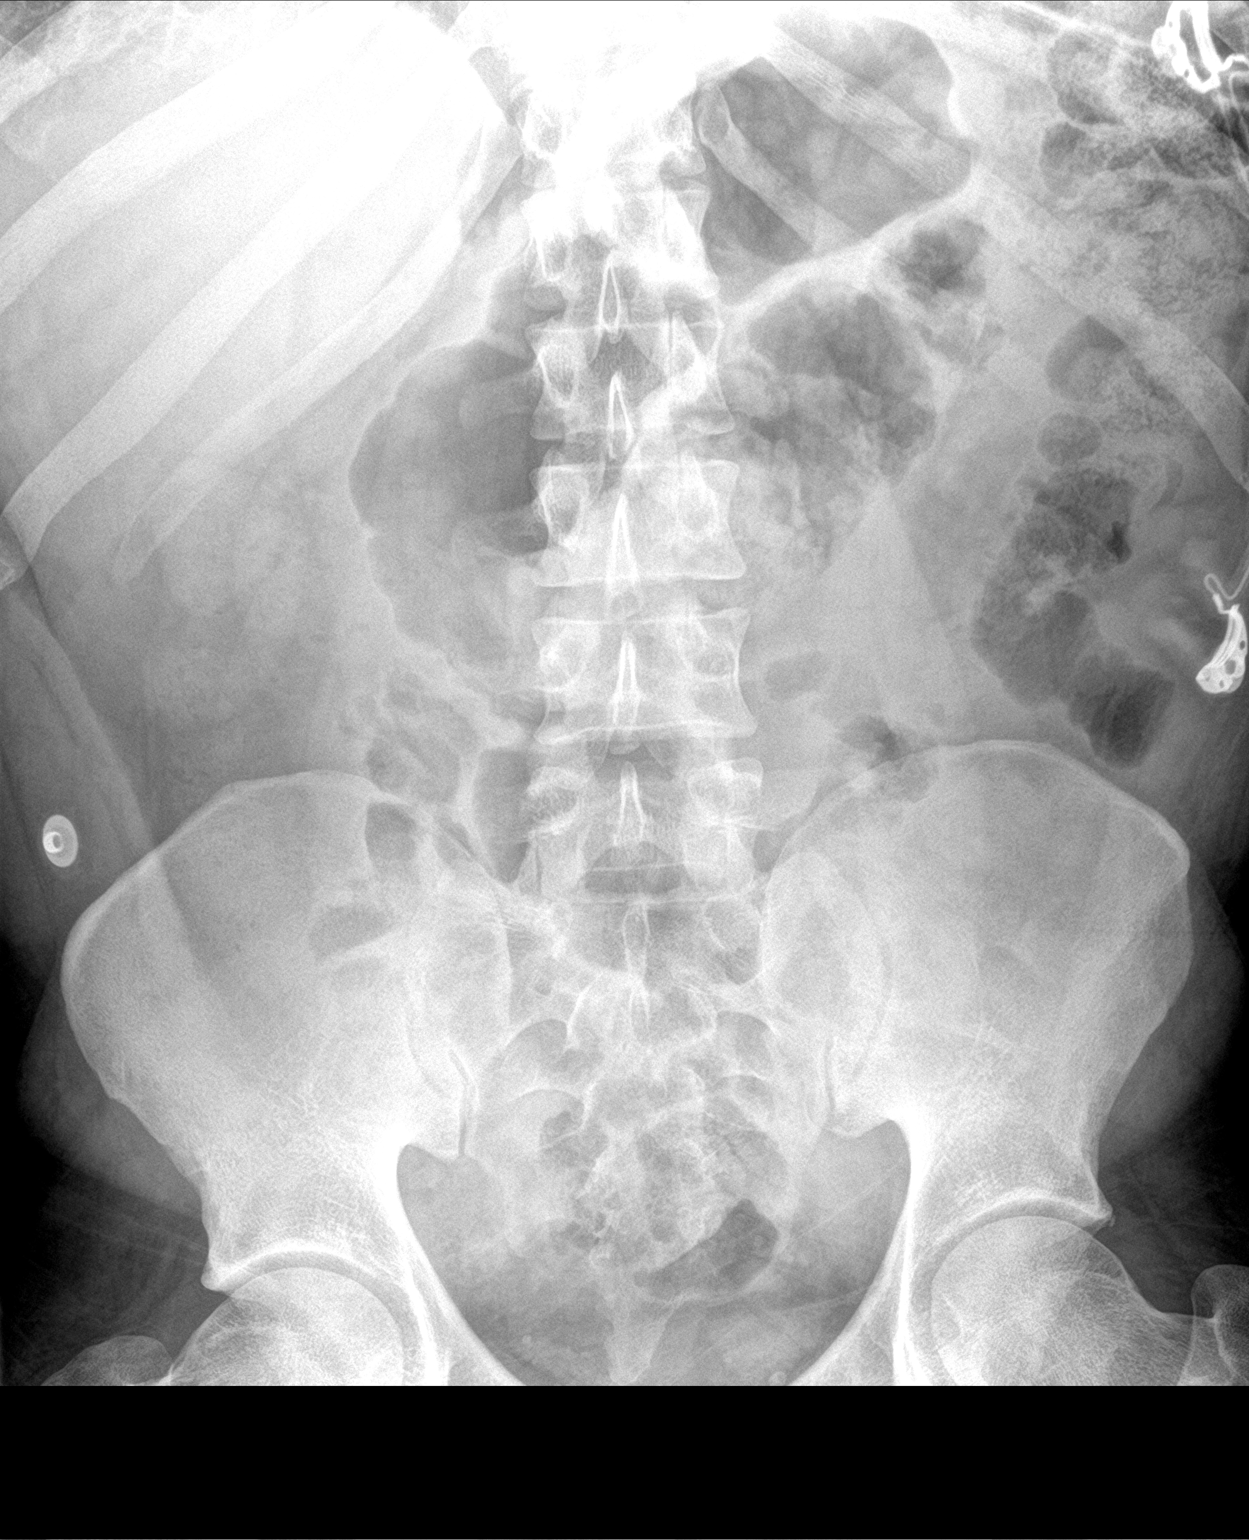

[3 of 3 positions shown; findings below may reference images not displayed]

FINDINGS: There is no evidence of dilated bowel loops or free intraperitoneal
air. No radiopaque calculi or other significant radiographic
abnormality is seen. Heart size and mediastinal contours are within
normal limits. Both lungs are clear.
IMPRESSION: Negative abdominal radiographs.  No acute cardiopulmonary disease.

## 2020-02-09 ENCOUNTER — Ambulatory Visit
Admission: EM | Admit: 2020-02-09 | Discharge: 2020-02-09 | Disposition: A | Discharge status: Home / Self Care | Payer: Medicaid Other | Attending: Emergency Medicine | Admitting: Emergency Medicine

## 2020-02-09 ENCOUNTER — Other Ambulatory Visit: Payer: Self-pay

## 2020-02-09 DIAGNOSIS — M546 Pain in thoracic spine: Secondary | ICD-10-CM

## 2020-02-09 MED ORDER — PREDNISONE 10 MG PO TABS: 20.0000 mg | ORAL_TABLET | Freq: Every day | ORAL | 0 refills | Status: DC

## 2020-02-09 MED ORDER — KETOROLAC TROMETHAMINE 60 MG/2ML IM SOLN: 60.0000 mg | Freq: Once | INTRAMUSCULAR | Status: DC

## 2020-02-09 MED ORDER — CYCLOBENZAPRINE HCL 10 MG PO TABS: 10.0000 mg | ORAL_TABLET | Freq: Two times a day (BID) | ORAL | 0 refills | Status: DC | PRN

## 2020-02-09 MED ORDER — METHYLPREDNISOLONE SODIUM SUCC 125 MG IJ SOLR: 60.0000 mg | Freq: Once | INTRAMUSCULAR | Status: DC

## 2020-02-09 MED ORDER — IBUPROFEN 800 MG PO TABS: 800.0000 mg | ORAL_TABLET | Freq: Three times a day (TID) | ORAL | 0 refills | Status: DC

## 2020-02-09 NOTE — ED Provider Notes (Addendum)
[facility]    CSN: [id number] Arrival date & time: 02/09/20  0957      History   Chief Complaint Chief Complaint  Patient presents with  . Back Pain    HPI [name] is a 39 y.o. male.   Who presented to the urgent care with a complaint of back pain for the past 4 days.  Patient is unaware of any precipitating event.  He localizes the pain to middle thoracic back.  He describes the pain as constant and achy and rated the pain a 9 on a scale of 1-10.  He has tried OTC medications without relief.  His symptoms are made worse with ROM.  He denies similar symptoms in the past.        Past Medical History:  Diagnosis Date  . ADHD (attention deficit hyperactivity disorder)   . Asthma     Patient Active Problem List   Diagnosis Date Noted  . Adjustment disorder with mixed anxiety and depressed mood 04/06/2016  . Attention deficit hyperactivity disorder (ADHD) 04/06/2016  . Suicide ideation 04/04/2016    Past Surgical History:  Procedure Laterality Date  . head surgery (as a baby)         Home Medications    Prior to Admission medications   Medication Sig Start Date End Date Taking? Authorizing Provider  benzonatate (TESSALON) 100 MG capsule Take 1 capsule (100 mg total) by mouth 3 (three) times daily as needed for cough. 10/31/18   Samuel Jester, DO  cyclobenzaprine (FLEXERIL) 10 MG tablet Take 1 tablet (10 mg total) by mouth 2 (two) times daily as needed for muscle spasms. 02/09/20   Kenny Rea, Zachery Dakins, FNP  doxycycline (VIBRAMYCIN) 100 MG capsule Take 1 capsule (100 mg total) by mouth 2 (two) times daily. 12/14/19   Eber Hong, MD  hydrOXYzine (ATARAX/VISTARIL) 25 MG tablet Take 1 tablet (25 mg total) by mouth every 6 (six) hours. 07/29/18   Geoffery Lyons, MD  ibuprofen (ADVIL) 800 MG tablet Take 1 tablet (800 mg total) by mouth 3 (three) times daily. 02/09/20   Forest Redwine, Zachery Dakins, FNP  omeprazole (PRILOSEC) 20 MG capsule Take 1 capsule (20 mg  total) by mouth daily. 05/03/19   Rancour, Jeannett Senior, MD  oseltamivir (TAMIFLU) 75 MG capsule Take 1 capsule (75 mg total) by mouth every 12 (twelve) hours. 02/08/17   Burgess Amor, PA-C  predniSONE (DELTASONE) 10 MG tablet Take 2 tablets (20 mg total) by mouth daily. 02/09/20   Kymari Nuon, Zachery Dakins, FNP  sucralfate (CARAFATE) 1 g tablet Take 1 tablet (1 g total) by mouth 4 (four) times daily -  with meals and at bedtime. 05/03/19   Rancour, Jeannett Senior, MD  traZODone (DESYREL) 50 MG tablet Take 1 tablet (50 mg total) by mouth at bedtime as needed for sleep. 04/06/16   Rankin, Rada Hay, NP    Family History Family History  Problem Relation Age of Onset  . Healthy Mother   . Healthy Father     Social History Social History   Tobacco Use  . Smoking status: Current Some Day Smoker    Types: Cigarettes  . Smokeless tobacco: Never Used  Substance Use Topics  . Alcohol use: Yes    Comment: occasional  . Drug use: Not Currently    Types: Cocaine    Comment: months ago     Allergies   Bee venom   Review of Systems Review of Systems  Constitutional: Negative.   Respiratory: Negative.  Cardiovascular: Negative.   Musculoskeletal: Positive for back pain.  All other systems reviewed and are negative.    Physical Exam Triage Vital Signs ED Triage Vitals  Enc Vitals Group     BP 02/09/20 1006 (!) 144/92     Pulse Rate 02/09/20 1006 78     Resp 02/09/20 1006 16     Temp 02/09/20 1006 98.5 F (36.9 C)     Temp Source 02/09/20 1006 Oral     SpO2 02/09/20 1006 97 %     Weight --      Height --      Head Circumference --      Peak Flow --      Pain Score 02/09/20 1009 9     Pain Loc --      Pain Edu? --      Excl. in GC? --    No data found.  Updated Vital Signs BP (!) 144/92 (BP Location: Right Arm)   Pulse 78   Temp 98.5 F (36.9 C) (Oral)   Resp 16   SpO2 97%   Visual Acuity Right Eye Distance:   Left Eye Distance:   Bilateral Distance:    Right Eye Near:   Left Eye  Near:    Bilateral Near:     Physical Exam Vitals and nursing note reviewed.  Constitutional:      General: He is not in acute distress.    Appearance: Normal appearance. He is normal weight. He is not toxic-appearing.  Cardiovascular:     Rate and Rhythm: Normal rate and regular rhythm.     Pulses: Normal pulses.     Heart sounds: Normal heart sounds. No murmur. No gallop.   Pulmonary:     Effort: Pulmonary effort is normal. No respiratory distress.     Breath sounds: Normal breath sounds. No stridor. No wheezing, rhonchi or rales.  Chest:     Chest wall: No tenderness.  Musculoskeletal:        General: Tenderness present. No swelling, deformity or signs of injury.     Cervical back: Normal.     Thoracic back: Spasms and tenderness present.     Lumbar back: Normal.     Right lower leg: No edema.     Left lower leg: No edema.  Neurological:     Mental Status: He is alert.     Cranial Nerves: Cranial nerves are intact.     Sensory: Sensation is intact.     Motor: Motor function is intact.     Coordination: Coordination is intact.     Gait: Gait is intact.      UC Treatments / Results  Labs (all labs ordered are listed, but only abnormal results are displayed) Labs Reviewed - No data to display  EKG   Radiology No results found.  Procedures Procedures (including critical care time)  Medications Ordered in UC Medications  ketorolac (TORADOL) injection 60 mg (60 mg Intramuscular Not Given 02/09/20 1028)  methylPREDNISolone sodium succinate (SOLU-MEDROL) 125 mg/2 mL injection 60 mg (60 mg Intramuscular Not Given 02/09/20 1028)    Initial Impression / Assessment and Plan / UC Course  I have reviewed the triage vital signs and the nursing notes.  Pertinent labs & imaging results that were available during my care of the patient were reviewed by me and considered in my medical decision making (see chart for details).   Thoracic back pain Refused Toradol and  Solu-Medrol shot  Ibuprofen was prescribed Prednisone  was prescribed Flexeril was prescribed Advised patient to follow with primary care To return for worsening of symptoms  Final Clinical Impressions(s) / UC Diagnoses   Final diagnoses:  Acute midline thoracic back pain     Discharge Instructions     Rest, ice and heat as needed Ensure adequate ROM as tolerated. Prescribed ibuprofen as needed for inflammation and pain relief Prescribed flexeril  for muscle spasm.  Do not drive or operate heavy machinery while taking this medication Return here or go to ER if you have any new or worsening symptoms such as numbness/tingling of the inner thighs, loss of bladder or bowel control, headache/blurry vision, nausea/vomiting, confusion/altered mental status, dizziness, weakness, passing out, imbalance, etc...      ED Prescriptions    Medication Sig Dispense Auth. Provider   cyclobenzaprine (FLEXERIL) 10 MG tablet  (Status: Discontinued) Take 1 tablet (10 mg total) by mouth 2 (two) times daily as needed for muscle spasms. 20 tablet Demara Lover, Darrelyn Hillock, FNP   ibuprofen (ADVIL) 800 MG tablet  (Status: Discontinued) Take 1 tablet (800 mg total) by mouth 3 (three) times daily. 42 tablet Aloysious Vangieson S, FNP   predniSONE (DELTASONE) 10 MG tablet  (Status: Discontinued) Take 2 tablets (20 mg total) by mouth daily. 15 tablet Daven Pinckney, Darrelyn Hillock, FNP   cyclobenzaprine (FLEXERIL) 10 MG tablet Take 1 tablet (10 mg total) by mouth 2 (two) times daily as needed for muscle spasms. 20 tablet Dorea Duff S, FNP   ibuprofen (ADVIL) 800 MG tablet Take 1 tablet (800 mg total) by mouth 3 (three) times daily. 42 tablet Silveria Botz S, FNP   predniSONE (DELTASONE) 10 MG tablet Take 2 tablets (20 mg total) by mouth daily. 15 tablet Charyl Minervini, Darrelyn Hillock, FNP     PDMP not reviewed this encounter.   Emerson Monte, FNP 02/09/20 1025    Emerson Monte, FNP 02/09/20 1035

## 2020-02-09 NOTE — Discharge Instructions (Addendum)
Rest, ice and heat as needed Ensure adequate ROM as tolerated. Prescribed ibuprofen as needed for inflammation and pain relief Prescribed flexeril  for muscle spasm.  Do not drive or operate heavy machinery while taking this medication Return here or go to ER if you have any new or worsening symptoms such as numbness/tingling of the inner thighs, loss of bladder or bowel control, headache/blurry vision, nausea/vomiting, confusion/altered mental status, dizziness, weakness, passing out, imbalance, etc...   

## 2020-02-09 NOTE — ED Triage Notes (Addendum)
Pt presents to UC w/ c/o lower mid back pain x4 days. Pt does not recall injury.

## 2020-03-09 ENCOUNTER — Encounter (HOSPITAL_COMMUNITY): Payer: Self-pay | Admitting: Emergency Medicine

## 2020-03-09 ENCOUNTER — Emergency Department (HOSPITAL_COMMUNITY)
Admission: EM | Admit: 2020-03-09 | Discharge: 2020-03-10 | Disposition: A | Discharge status: Home / Self Care | Payer: Medicaid Other | Attending: Emergency Medicine | Admitting: Emergency Medicine

## 2020-03-09 ENCOUNTER — Other Ambulatory Visit: Payer: Self-pay

## 2020-03-09 DIAGNOSIS — Y929 Unspecified place or not applicable: Secondary | ICD-10-CM | POA: Diagnosis not present

## 2020-03-09 DIAGNOSIS — Y939 Activity, unspecified: Secondary | ICD-10-CM | POA: Diagnosis not present

## 2020-03-09 DIAGNOSIS — S61411A Laceration without foreign body of right hand, initial encounter: Secondary | ICD-10-CM | POA: Diagnosis not present

## 2020-03-09 DIAGNOSIS — Y999 Unspecified external cause status: Secondary | ICD-10-CM | POA: Insufficient documentation

## 2020-03-09 DIAGNOSIS — F17210 Nicotine dependence, cigarettes, uncomplicated: Secondary | ICD-10-CM | POA: Insufficient documentation

## 2020-03-09 DIAGNOSIS — Z79899 Other long term (current) drug therapy: Secondary | ICD-10-CM | POA: Diagnosis not present

## 2020-03-09 DIAGNOSIS — W25XXXA Contact with sharp glass, initial encounter: Secondary | ICD-10-CM | POA: Diagnosis not present

## 2020-03-09 MED ORDER — LIDOCAINE HCL (PF) 1 % IJ SOLN
5.0000 mL | Freq: Once | INTRAMUSCULAR | Status: AC
  Administered 2020-03-09: 5 mL via INTRADERMAL
  Filled 2020-03-09: qty 30

## 2020-03-09 NOTE — ED Triage Notes (Signed)
Patient has a laceration on his right hand with bleeding controlled. Patient was not aware of broken glass on a screen door and cut his hand.

## 2020-03-09 NOTE — ED Notes (Signed)
ED Provider at bedside. 

## 2020-03-10 NOTE — ED Provider Notes (Signed)
[facility] EMERGENCY DEPARTMENT Provider Note   CSN: [id number] Arrival date & time: 03/09/20  8676     History Chief Complaint  Patient presents with  . Laceration    [name] is a 39 y.o. male.  No language interpreter was used.  Laceration Location:  Hand Hand laceration location:  R hand Length:  2 Quality: straight   Laceration mechanism:  Broken glass Pain details:    Quality:  Aching   Severity:  No pain Foreign body present:  Glass Relieved by:  Nothing Worsened by:  Nothing Ineffective treatments:  None tried Tetanus status:  Up to date      Past Medical History:  Diagnosis Date  . ADHD (attention deficit hyperactivity disorder)   . Asthma     Patient Active Problem List   Diagnosis Date Noted  . Adjustment disorder with mixed anxiety and depressed mood 04/06/2016  . Attention deficit hyperactivity disorder (ADHD) 04/06/2016  . Suicide ideation 04/04/2016    Past Surgical History:  Procedure Laterality Date  . head surgery (as a baby)         Family History  Problem Relation Age of Onset  . Healthy Mother   . Healthy Father     Social History   Tobacco Use  . Smoking status: Current Some Day Smoker    Types: Cigarettes  . Smokeless tobacco: Never Used  Substance Use Topics  . Alcohol use: Yes    Comment: occasional  . Drug use: Not Currently    Types: Cocaine    Comment: months ago    Home Medications Prior to Admission medications   Medication Sig Start Date End Date Taking? Authorizing Provider  benzonatate (TESSALON) 100 MG capsule Take 1 capsule (100 mg total) by mouth 3 (three) times daily as needed for cough. 10/31/18   Francine Graven, DO  cyclobenzaprine (FLEXERIL) 10 MG tablet Take 1 tablet (10 mg total) by mouth 2 (two) times daily as needed for muscle spasms. 02/09/20   Avegno, Darrelyn Hillock, FNP  doxycycline (VIBRAMYCIN) 100 MG capsule Take 1 capsule (100 mg total) by mouth 2 (two) times daily. 12/14/19    Noemi Chapel, MD  hydrOXYzine (ATARAX/VISTARIL) 25 MG tablet Take 1 tablet (25 mg total) by mouth every 6 (six) hours. 07/29/18   Veryl Speak, MD  ibuprofen (ADVIL) 800 MG tablet Take 1 tablet (800 mg total) by mouth 3 (three) times daily. 02/09/20   Avegno, Darrelyn Hillock, FNP  omeprazole (PRILOSEC) 20 MG capsule Take 1 capsule (20 mg total) by mouth daily. 05/03/19   Rancour, Annie Main, MD  oseltamivir (TAMIFLU) 75 MG capsule Take 1 capsule (75 mg total) by mouth every 12 (twelve) hours. 02/08/17   Evalee Jefferson, PA-C  predniSONE (DELTASONE) 10 MG tablet Take 2 tablets (20 mg total) by mouth daily. 02/09/20   Avegno, Darrelyn Hillock, FNP  sucralfate (CARAFATE) 1 g tablet Take 1 tablet (1 g total) by mouth 4 (four) times daily -  with meals and at bedtime. 05/03/19   Rancour, Annie Main, MD  traZODone (DESYREL) 50 MG tablet Take 1 tablet (50 mg total) by mouth at bedtime as needed for sleep. 04/06/16   Rankin, Shuvon B, NP    Allergies    Bee venom  Review of Systems   Review of Systems  All other systems reviewed and are negative.   Physical Exam Updated Vital Signs BP 132/82 (BP Location: Right Arm)   Pulse 67   Temp 98.1 F (36.7 C) (Oral)  Resp 16   Ht 5\' 10"  (1.778 m)   Wt 88 kg   SpO2 100%   BMI 27.84 kg/m   Physical Exam Vitals reviewed.  Musculoskeletal:        General: No swelling or deformity. Normal range of motion.  Skin:    Comments: 2.0  Neurological:     General: No focal deficit present.     Mental Status: He is alert.  Psychiatric:        Mood and Affect: Mood normal.     ED Results / Procedures / Treatments   Labs (all labs ordered are listed, but only abnormal results are displayed) Labs Reviewed - No data to display  EKG None  Radiology No results found.  Procedures . Laceration Repair  Date/Time: 03/10/2020 12:11 AM Performed by: 03/12/2020, PA-C Authorized by: Elson Areas, PA-C   Consent:    Consent obtained:  Verbal   Consent given by:   Patient   Risks discussed:  Infection, need for additional repair, pain, poor cosmetic result and poor wound healing   Alternatives discussed:  No treatment and delayed treatment Universal protocol:    Procedure explained and questions answered to patient or proxy's satisfaction: yes     Relevant documents present and verified: yes     Test results available and properly labeled: yes     Imaging studies available: yes     Required blood products, implants, devices, and special equipment available: yes     Site/side marked: yes     Immediately prior to procedure, a time out was called: yes     Patient identity confirmed:  Verbally with patient Anesthesia (see MAR for exact dosages):    Anesthesia method:  Local infiltration   Local anesthetic:  Lidocaine 1% w/o epi Laceration details:    Location:  Hand   Length (cm):  2 Repair type:    Repair type:  Simple Pre-procedure details:    Preparation:  Patient was prepped and draped in usual sterile fashion Exploration:    Wound exploration: wound explored through full range of motion     Contaminated: no   Treatment:    Area cleansed with:  Betadine   Irrigation solution:  Sterile saline Skin repair:    Repair method:  Sutures   Suture size:  5-0   Suture material:  Prolene   Suture technique:  Simple interrupted Approximation:    Approximation:  Loose Post-procedure details:    Dressing:  Antibiotic ointment   Patient tolerance of procedure:  Tolerated well, no immediate complications   (including critical care time)  Medications Ordered in ED Medications  lidocaine (PF) (XYLOCAINE) 1 % injection 5 mL (5 mLs Intradermal Given 03/09/20 2350)    ED Course  I have reviewed the triage vital signs and the nursing notes.  Pertinent labs & imaging results that were available during my care of the patient were reviewed by me and considered in my medical decision making (see chart for details).    MDM Rules/Calculators/A&P                        Final Clinical Impression(s) / ED Diagnoses Final diagnoses:  Laceration of right hand, foreign body presence unspecified, initial encounter    Rx / DC Orders ED Discharge Orders    None    An After Visit Summary was printed and given to the patient.    03/11/20, Elson Areas 03/10/20 0012  Bethann Berkshire, MD 03/14/20 [phone]

## 2020-03-10 NOTE — Discharge Instructions (Signed)
Return in 7-8 days for suture removal

## 2020-03-14 DIAGNOSIS — S61411A Laceration without foreign body of right hand, initial encounter: Secondary | ICD-10-CM | POA: Diagnosis not present

## 2021-01-06 ENCOUNTER — Encounter: Payer: Self-pay | Admitting: Emergency Medicine

## 2021-01-06 ENCOUNTER — Other Ambulatory Visit: Payer: Self-pay

## 2021-01-06 ENCOUNTER — Ambulatory Visit
Admission: EM | Admit: 2021-01-06 | Discharge: 2021-01-06 | Disposition: A | Discharge status: Home / Self Care | Payer: Medicaid Other | Attending: Family Medicine | Admitting: Family Medicine

## 2021-01-06 DIAGNOSIS — J029 Acute pharyngitis, unspecified: Secondary | ICD-10-CM

## 2021-01-06 DIAGNOSIS — R0981 Nasal congestion: Secondary | ICD-10-CM

## 2021-01-06 DIAGNOSIS — B349 Viral infection, unspecified: Secondary | ICD-10-CM

## 2021-01-06 DIAGNOSIS — Z20822 Contact with and (suspected) exposure to covid-19: Secondary | ICD-10-CM

## 2021-01-06 DIAGNOSIS — R6889 Other general symptoms and signs: Secondary | ICD-10-CM | POA: Diagnosis not present

## 2021-01-06 NOTE — ED Triage Notes (Signed)
Right ear feels clogged, and congestion in chest for few days.  Has been ringing in that ear. Has tried peroxide.

## 2021-01-06 NOTE — ED Provider Notes (Signed)
[facility]   [id number] 01/06/21 Arrival Time: 1312   CC: COVID symptoms  SUBJECTIVE: History from: patient.  [name] is a 40 y.o. male who presents with nasal congestion, sore throat and positive covid exposure x 2-3 days. Denies recent travel. Has negative history of Covid. Has not completed Covid vaccines. Has been taking Dayquil with relief. There are no aggravating or alleviating factors. Denies previous symptoms in the past. Denies fever, chills, fatigue, sinus pain, rhinorrhea, SOB, wheezing, chest pain, nausea, changes in bowel or bladder habits.    ROS: As per HPI.  All other pertinent ROS negative.     Past Medical History:  Diagnosis Date  . ADHD (attention deficit hyperactivity disorder)   . Asthma    Past Surgical History:  Procedure Laterality Date  . head surgery (as a baby)     Allergies  Allergen Reactions  . Bee Venom Anaphylaxis   No current facility-administered medications on file prior to encounter.   Current Outpatient Medications on File Prior to Encounter  Medication Sig Dispense Refill  . benzonatate (TESSALON) 100 MG capsule Take 1 capsule (100 mg total) by mouth 3 (three) times daily as needed for cough. 15 capsule 0  . cyclobenzaprine (FLEXERIL) 10 MG tablet Take 1 tablet (10 mg total) by mouth 2 (two) times daily as needed for muscle spasms. 20 tablet 0  . doxycycline (VIBRAMYCIN) 100 MG capsule Take 1 capsule (100 mg total) by mouth 2 (two) times daily. 20 capsule 0  . hydrOXYzine (ATARAX/VISTARIL) 25 MG tablet Take 1 tablet (25 mg total) by mouth every 6 (six) hours. 12 tablet 0  . ibuprofen (ADVIL) 800 MG tablet Take 1 tablet (800 mg total) by mouth 3 (three) times daily. 42 tablet 0  . omeprazole (PRILOSEC) 20 MG capsule Take 1 capsule (20 mg total) by mouth daily. 30 capsule 0  . oseltamivir (TAMIFLU) 75 MG capsule Take 1 capsule (75 mg total) by mouth every 12 (twelve) hours. 10 capsule 0  . predniSONE (DELTASONE) 10 MG  tablet Take 2 tablets (20 mg total) by mouth daily. 15 tablet 0  . sucralfate (CARAFATE) 1 g tablet Take 1 tablet (1 g total) by mouth 4 (four) times daily -  with meals and at bedtime. 30 tablet 0  . traZODone (DESYREL) 50 MG tablet Take 1 tablet (50 mg total) by mouth at bedtime as needed for sleep. 15 tablet 0   Social History   Socioeconomic History  . Marital status: Single    Spouse name: Not on file  . Number of children: Not on file  . Years of education: Not on file  . Highest education level: Not on file  Occupational History  . Not on file  Tobacco Use  . Smoking status: Current Some Day Smoker    Types: Cigarettes  . Smokeless tobacco: Never Used  Vaping Use  . Vaping Use: Never used  Substance and Sexual Activity  . Alcohol use: Yes    Comment: occasional  . Drug use: Not Currently    Types: Cocaine    Comment: months ago  . Sexual activity: Yes  Other Topics Concern  . Not on file  Social History Narrative  . Not on file   Social Determinants of Health   Financial Resource Strain: Not on file  Food Insecurity: Not on file  Transportation Needs: Not on file  Physical Activity: Not on file  Stress: Not on file  Social Connections: Not on file  Intimate  Partner Violence: Not on file   Family History  Problem Relation Age of Onset  . Healthy Mother   . Healthy Father     OBJECTIVE:  Vitals:   01/06/21 1411 01/06/21 1412  BP: (!) 149/77   Pulse: 61   Resp: 16   Temp: 98.3 F (36.8 C)   TempSrc: Oral   SpO2: 95%   Weight:  198 lb (89.8 kg)     General appearance: alert; appears fatigued, but nontoxic; speaking in full sentences and tolerating own secretions HEENT: NCAT; Ears: EACs clear, TMs pearly gray; Eyes: PERRL.  EOM grossly intact. Sinuses: nontender; Nose: nares patent with clear rhinorrhea, Throat: oropharynx erythematous, cobblestoning present, tonsils non erythematous or enlarged, uvula midline  Neck: supple without LAD Lungs:  unlabored respirations, symmetrical air entry; cough: absent; no respiratory distress; CTAB Heart: regular rate and rhythm.  Radial pulses 2+ symmetrical bilaterally Skin: warm and dry Psychological: alert and cooperative; normal mood and affect  LABS:  No results found for this or any previous visit (from the past 24 hour(s)).   ASSESSMENT & PLAN:  1. Exposure to COVID-19 virus   2. Nasal congestion   3. Sore throat   4. Viral illness    Continue supportive care at home COVID and flu testing ordered.  It will take between 2-3 days for test results. Someone will contact you regarding abnormal results.   Work  note provided Patient should remain in quarantine until they have received Covid results.  If negative you may resume normal activities (go back to work/school) while practicing hand hygiene, social distance, and mask wearing.  If positive, patient should remain in quarantine for at least 5 days from symptom onset AND greater than 72 hours after symptoms resolution (absence of fever without the use of fever-reducing medication and improvement in respiratory symptoms), whichever is longer Get plenty of rest and push fluids Use OTC zyrtec for nasal congestion, runny nose, and/or sore throat Use OTC flonase for nasal congestion and runny nose Use medications daily for symptom relief Use OTC medications like ibuprofen or tylenol as needed fever or pain Call or go to the ED if you have any new or worsening symptoms such as fever, worsening cough, shortness of breath, chest tightness, chest pain, turning blue, changes in mental status.  Reviewed expectations re: course of current medical issues. Questions answered. Outlined signs and symptoms indicating need for more acute intervention. Patient verbalized understanding. After Visit Summary given.         Moshe Cipro, NP 01/06/21 1442

## 2021-01-06 NOTE — Discharge Instructions (Signed)
Your COVID test is pending.  You should self quarantine until the test result is back.    Take Tylenol or ibuprofen as needed for fever or discomfort.  Rest and keep yourself hydrated.    Follow-up with your primary care provider if your symptoms are not improving.     

## 2021-01-08 LAB — COVID-19, FLU A+B NAA
Influenza A, NAA: NOT DETECTED
Influenza B, NAA: NOT DETECTED
SARS-CoV-2, NAA: DETECTED — AB

## 2021-08-21 ENCOUNTER — Ambulatory Visit
Admission: EM | Admit: 2021-08-21 | Discharge: 2021-08-21 | Disposition: A | Discharge status: Home / Self Care | Payer: Medicaid Other | Attending: Family Medicine | Admitting: Family Medicine

## 2021-08-21 DIAGNOSIS — M7751 Other enthesopathy of right foot: Secondary | ICD-10-CM | POA: Diagnosis not present

## 2021-08-21 MED ORDER — PREDNISONE 20 MG PO TABS: 40.0000 mg | ORAL_TABLET | Freq: Every day | ORAL | 0 refills | Status: DC

## 2021-08-21 NOTE — ED Provider Notes (Signed)
[facility]    CSN: [id number] Arrival date & time: 08/21/21  1233      History   Chief Complaint Chief Complaint  Patient presents with   Ankle Pain    right    HPI [name] is a 40 y.o. male.   HPI Patient presents today with 2 days of right ankle pain at the lateral malleolus and is radiating up the lateral aspect of his leg.  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  He denies any injury such as twisting his right foot or any distant injury.  He has been soaking his foot in warm water and also applying icy hot without any relief.  He presents today for evaluation. Past Medical History:  Diagnosis Date   ADHD (attention deficit hyperactivity disorder)    Asthma     Patient Active Problem List   Diagnosis Date Noted   Adjustment disorder with mixed anxiety and depressed mood 04/06/2016   Attention deficit hyperactivity disorder (ADHD) 04/06/2016   Suicide ideation 04/04/2016    Past Surgical History:  Procedure Laterality Date   head surgery (as a baby)         Home Medications    Prior to Admission medications   Medication Sig Start Date End Date Taking? Authorizing Provider  predniSONE (DELTASONE) 20 MG tablet Take 2 tablets (40 mg total) by mouth daily with breakfast. 08/21/21  Yes Bing Neighbors, FNP  benzonatate (TESSALON) 100 MG capsule Take 1 capsule (100 mg total) by mouth 3 (three) times daily as needed for cough. 10/31/18   Samuel Jester, DO  cyclobenzaprine (FLEXERIL) 10 MG tablet Take 1 tablet (10 mg total) by mouth 2 (two) times daily as needed for muscle spasms. 02/09/20   Avegno, Zachery Dakins, FNP  doxycycline (VIBRAMYCIN) 100 MG capsule Take 1 capsule (100 mg total) by mouth 2 (two) times daily. 12/14/19   Eber Hong, MD  hydrOXYzine (ATARAX/VISTARIL) 25 MG tablet Take 1 tablet (25 mg total) by mouth every 6 (six) hours.  07/29/18   Geoffery Lyons, MD  ibuprofen (ADVIL) 800 MG tablet Take 1 tablet (800 mg total) by mouth 3 (three) times daily. 02/09/20   Avegno, Zachery Dakins, FNP  omeprazole (PRILOSEC) 20 MG capsule Take 1 capsule (20 mg total) by mouth daily. 05/03/19   Rancour, Jeannett Senior, MD  oseltamivir (TAMIFLU) 75 MG capsule Take 1 capsule (75 mg total) by mouth every 12 (twelve) hours. 02/08/17   Burgess Amor, PA-C  sucralfate (CARAFATE) 1 g tablet Take 1 tablet (1 g total) by mouth 4 (four) times daily -  with meals and at bedtime. 05/03/19   Rancour, Jeannett Senior, MD  traZODone (DESYREL) 50 MG tablet Take 1 tablet (50 mg total) by mouth at bedtime as needed for sleep. 04/06/16   Rankin, Rada Hay, NP    Family History Family History  Problem Relation Age of Onset   Healthy Mother    Healthy Father     Social History Social History   Tobacco Use   Smoking status: Some Days    Types: Cigarettes   Smokeless tobacco: Never  Vaping Use   Vaping Use: Never used  Substance Use Topics   Alcohol use: Yes    Comment: occasional   Drug use: Not Currently    Types: Cocaine    Comment: months ago     Allergies   Bee venom  Review of Systems Review of Systems Pertinent negatives listed in HPI  Physical Exam Triage Vital Signs ED Triage Vitals  Enc Vitals Group     BP 08/21/21 1301 (!) 144/85     Pulse Rate 08/21/21 1301 66     Resp 08/21/21 1301 16     Temp 08/21/21 1301 98.1 F (36.7 C)     Temp Source 08/21/21 1301 Oral     SpO2 08/21/21 1301 97 %     Weight --      Height --      Head Circumference --      Peak Flow --      Pain Score 08/21/21 1312 10     Pain Loc --      Pain Edu? --      Excl. in GC? --    No data found.  Updated Vital Signs BP (!) 144/85 (BP Location: Right Arm)   Pulse 66   Temp 98.1 F (36.7 C) (Oral)   Resp 16   SpO2 97%   Visual Acuity Right Eye Distance:   Left Eye Distance:   Bilateral Distance:    Right Eye Near:   Left Eye Near:    Bilateral Near:      Physical Exam Constitutional:      Appearance: Normal appearance. He is not ill-appearing.  Cardiovascular:     Rate and Rhythm: Normal rate and regular rhythm.  Pulmonary:     Effort: Pulmonary effort is normal.     Breath sounds: Normal breath sounds and air entry.  Musculoskeletal:       Feet:  Feet:     Comments: ROM of right foot remains intact. No deformity noted  Skin intact  DP +2 Skin:    General: Skin is warm.     Capillary Refill: Capillary refill takes less than 2 seconds.  Psychiatric:        Attention and Perception: Attention normal.        Behavior: Behavior is cooperative.     UC Treatments / Results  Labs (all labs ordered are listed, but only abnormal results are displayed) Labs Reviewed - No data to display  EKG   Radiology No results found.  Procedures Procedures (including critical care time)  Medications Ordered in UC Medications - No data to display  Initial Impression / Assessment and Plan / UC Course  I have reviewed the triage vital signs and the nursing notes.  Pertinent labs & imaging results that were available during my care of the patient were reviewed by me and considered in my medical decision making (see chart for details).    Right ankle tendinitis treatment today with prednisone 40 mg once daily for the next 5 days. Patient also encouraged to maintain an Ace wrap on right ankle while ambulating during periods of prolonged standing to improve support and prevent rolling of ankle. Return precautions given. Final Clinical Impressions(s) / UC Diagnoses   Final diagnoses:  Right ankle tendonitis   Discharge Instructions   None    ED Prescriptions     Medication Sig Dispense Auth. Provider   predniSONE (DELTASONE) 20 MG tablet Take 2 tablets (40 mg total) by mouth daily with breakfast. 10 tablet Bing Neighbors, FNP      PDMP not reviewed this encounter.   Bing Neighbors, FNP 08/21/21 1421

## 2021-08-21 NOTE — ED Triage Notes (Signed)
Two day h/o right ankle pain that has increased since the onset. Pt reports that his ankle pain radiates up the lateral surface of his right calf stopping at the knee. Pain increases when Pt tries to put his shoe on. Has tried epsom salt soaks without relief. Notes some relief with [facility]. Notes swelling. No meds taken. No falls or injuries noted.

## 2021-12-26 ENCOUNTER — Ambulatory Visit
Admission: EM | Admit: 2021-12-26 | Discharge: 2021-12-26 | Disposition: A | Discharge status: Home / Self Care | Payer: Medicaid Other | Attending: Family Medicine | Admitting: Family Medicine

## 2021-12-26 ENCOUNTER — Other Ambulatory Visit: Payer: Self-pay

## 2021-12-26 DIAGNOSIS — J069 Acute upper respiratory infection, unspecified: Secondary | ICD-10-CM | POA: Diagnosis not present

## 2021-12-26 DIAGNOSIS — J3089 Other allergic rhinitis: Secondary | ICD-10-CM

## 2021-12-26 DIAGNOSIS — Z20828 Contact with and (suspected) exposure to other viral communicable diseases: Secondary | ICD-10-CM | POA: Diagnosis not present

## 2021-12-26 DIAGNOSIS — J4521 Mild intermittent asthma with (acute) exacerbation: Secondary | ICD-10-CM | POA: Diagnosis not present

## 2021-12-26 MED ORDER — ALBUTEROL SULFATE HFA 108 (90 BASE) MCG/ACT IN AERS: 1.0000 | INHALATION_SPRAY | Freq: Four times a day (QID) | RESPIRATORY_TRACT | 0 refills | Status: DC | PRN

## 2021-12-26 MED ORDER — PROMETHAZINE-DM 6.25-15 MG/5ML PO SYRP: 5.0000 mL | ORAL_SOLUTION | Freq: Four times a day (QID) | ORAL | 0 refills | Status: DC | PRN

## 2021-12-26 MED ORDER — PREDNISONE 20 MG PO TABS: 40.0000 mg | ORAL_TABLET | Freq: Every day | ORAL | 0 refills | Status: DC

## 2021-12-26 MED ORDER — CETIRIZINE HCL 10 MG PO TABS: 10.0000 mg | ORAL_TABLET | Freq: Every day | ORAL | 5 refills | Status: DC

## 2021-12-26 MED ORDER — FLUTICASONE PROPIONATE 50 MCG/ACT NA SUSP: 1.0000 | Freq: Two times a day (BID) | NASAL | 5 refills | Status: DC

## 2021-12-26 NOTE — ED Provider Notes (Signed)
[facility]    CSN: [id number] Arrival date & time: 12/26/21  1646      History   Chief Complaint Chief Complaint  Patient presents with   Sore Throat    Cough, congestion, sore throat and headache    HPI [name] is a 41 y.o. male.   Presenting today with 4 to 5-day history of worsening congestion, sore throat, hoarseness, productive cough, chest tightness, fatigue.  Denies fever, chills, body aches, chest pain, shortness of breath, abdominal pain, nausea vomiting or diarrhea.  States he took a home COVID test this morning that was negative.  Does have a history of asthma not currently on any medications for this.  No known sick contacts recently.   Past Medical History:  Diagnosis Date   ADHD (attention deficit hyperactivity disorder)    Asthma     Patient Active Problem List   Diagnosis Date Noted   Adjustment disorder with mixed anxiety and depressed mood 04/06/2016   Attention deficit hyperactivity disorder (ADHD) 04/06/2016   Suicide ideation 04/04/2016    Past Surgical History:  Procedure Laterality Date   head surgery (as a baby)         Home Medications    Prior to Admission medications   Medication Sig Start Date End Date Taking? Authorizing Provider  albuterol (VENTOLIN HFA) 108 (90 Base) MCG/ACT inhaler Inhale 1-2 puffs into the lungs every 6 (six) hours as needed for wheezing or shortness of breath. 12/26/21  Yes Particia NearingLane, Mckenna Boruff Elizabeth, PA-C  cetirizine (ZYRTEC ALLERGY) 10 MG tablet Take 1 tablet (10 mg total) by mouth daily. 12/26/21  Yes Particia NearingLane, Santiana Glidden Elizabeth, PA-C  fluticasone [facility](FLONASE) 50 MCG/ACT nasal spray Place 1 spray into both nostrils 2 (two) times daily. 12/26/21  Yes Particia NearingLane, Treon Kehl Elizabeth, PA-C  predniSONE (DELTASONE) 20 MG tablet Take 2 tablets (40 mg total) by mouth daily with breakfast. 12/26/21  Yes Particia NearingLane, Prescott Truex Elizabeth, PA-C  promethazine-dextromethorphan (PROMETHAZINE-DM) 6.25-15 MG/5ML syrup Take 5 mLs by mouth 4  (four) times daily as needed. 12/26/21  Yes Particia NearingLane, Tonnie Friedel Elizabeth, PA-C  benzonatate (TESSALON) 100 MG capsule Take 1 capsule (100 mg total) by mouth 3 (three) times daily as needed for cough. 10/31/18   Samuel JesterMcManus, Kathleen, DO  cyclobenzaprine (FLEXERIL) 10 MG tablet Take 1 tablet (10 mg total) by mouth 2 (two) times daily as needed for muscle spasms. 02/09/20   Avegno, Zachery DakinsKomlanvi S, FNP  doxycycline (VIBRAMYCIN) 100 MG capsule Take 1 capsule (100 mg total) by mouth 2 (two) times daily. 12/14/19   Eber HongMiller, Brian, MD  hydrOXYzine (ATARAX/VISTARIL) 25 MG tablet Take 1 tablet (25 mg total) by mouth every 6 (six) hours. 07/29/18   Geoffery Lyonselo, Douglas, MD  ibuprofen (ADVIL) 800 MG tablet Take 1 tablet (800 mg total) by mouth 3 (three) times daily. 02/09/20   Avegno, Zachery DakinsKomlanvi S, FNP  omeprazole (PRILOSEC) 20 MG capsule Take 1 capsule (20 mg total) by mouth daily. 05/03/19   Rancour, Jeannett SeniorStephen, MD  oseltamivir (TAMIFLU) 75 MG capsule Take 1 capsule (75 mg total) by mouth every 12 (twelve) hours. 02/08/17   Burgess AmorIdol, Julie, PA-C  predniSONE (DELTASONE) 20 MG tablet Take 2 tablets (40 mg total) by mouth daily with breakfast. 08/21/21   Bing NeighborsHarris, Kimberly S, FNP  sucralfate (CARAFATE) 1 g tablet Take 1 tablet (1 g total) by mouth 4 (four) times daily -  with meals and at bedtime. 05/03/19   Rancour, Jeannett SeniorStephen, MD  traZODone (DESYREL) 50 MG tablet Take 1 tablet (50 mg total)  by mouth at bedtime as needed for sleep. 04/06/16   Rankin, Rada Hay, NP    Family History Family History  Problem Relation Age of Onset   Healthy Mother    Healthy Father     Social History Social History   Tobacco Use   Smoking status: Some Days    Packs/day: 0.50    Types: Cigarettes   Smokeless tobacco: Never  Vaping Use   Vaping Use: Never used  Substance Use Topics   Alcohol use: Yes    Comment: occasional   Drug use: Not Currently    Types: Cocaine    Comment: months ago     Allergies   Bee venom   Review of Systems Review of  Systems Per HPI  Physical Exam Triage Vital Signs ED Triage Vitals  Enc Vitals Group     BP 12/26/21 1800 136/86     Pulse Rate 12/26/21 1800 65     Resp 12/26/21 1800 18     Temp 12/26/21 1800 98.7 F (37.1 C)     Temp Source 12/26/21 1800 Oral     SpO2 12/26/21 1800 95 %     Weight --      Height --      Head Circumference --      Peak Flow --      Pain Score 12/26/21 1756 9     Pain Loc --      Pain Edu? --      Excl. in GC? --    No data found.  Updated Vital Signs BP 136/86 (BP Location: Right Arm)    Pulse 65    Temp 98.7 F (37.1 C) (Oral)    Resp 18    SpO2 95%   Visual Acuity Right Eye Distance:   Left Eye Distance:   Bilateral Distance:    Right Eye Near:   Left Eye Near:    Bilateral Near:     Physical Exam Vitals and nursing note reviewed.  Constitutional:      Appearance: He is well-developed.  HENT:     Head: Atraumatic.     Right Ear: External ear normal.     Left Ear: External ear normal.     Nose: Rhinorrhea present.     Mouth/Throat:     Pharynx: Posterior oropharyngeal erythema present. No oropharyngeal exudate.  Eyes:     Conjunctiva/sclera: Conjunctivae normal.     Pupils: Pupils are equal, round, and reactive to light.  Cardiovascular:     Rate and Rhythm: Normal rate and regular rhythm.  Pulmonary:     Effort: Pulmonary effort is normal. No respiratory distress.     Breath sounds: Wheezing present. No rales.     Comments: Minimal wheezes scattered bilaterally Abdominal:     General: Bowel sounds are normal. There is no distension.     Palpations: Abdomen is soft.     Tenderness: There is no abdominal tenderness. There is no guarding.  Musculoskeletal:        General: Normal range of motion.     Cervical back: Normal range of motion and neck supple.  Lymphadenopathy:     Cervical: No cervical adenopathy.  Skin:    General: Skin is warm and dry.  Neurological:     Mental Status: He is alert and oriented to person, place, and  time.  Psychiatric:        Behavior: Behavior normal.     UC Treatments / Results  Labs (all labs  ordered are listed, but only abnormal results are displayed) Labs Reviewed  COVID-19, FLU A+B NAA    EKG   Radiology No results found.  Procedures Procedures (including critical care time)  Medications Ordered in UC Medications - No data to display  Initial Impression / Assessment and Plan / UC Course  I have reviewed the triage vital signs and the nursing notes.  Pertinent labs & imaging results that were available during my care of the patient were reviewed by me and considered in my medical decision making (see chart for details).     Vital signs benign and reassuring, COVID and flu testing pending.  Suspect viral upper respiratory infection causing a secondary asthma exacerbation.  We will treat that with prednisone, albuterol, Phenergan DM in addition to Mucinex and over-the-counter supportive medications and home care.  Work note given.  Return for acutely worsening symptoms.  Final Clinical Impressions(s) / UC Diagnoses   Final diagnoses:  Exposure to the flu  Viral URI with cough  Mild intermittent asthma with acute exacerbation  Seasonal allergic rhinitis due to other allergic trigger   Discharge Instructions   None    ED Prescriptions     Medication Sig Dispense Auth. Provider   predniSONE (DELTASONE) 20 MG tablet Take 2 tablets (40 mg total) by mouth daily with breakfast. 10 tablet Particia Nearing, PA-C   albuterol (VENTOLIN HFA) 108 (90 Base) MCG/ACT inhaler Inhale 1-2 puffs into the lungs every 6 (six) hours as needed for wheezing or shortness of breath. 18 g Roosvelt Maser [place], [place]   promethazine-dextromethorphan (PROMETHAZINE-DM) 6.25-15 MG/5ML syrup Take 5 mLs by mouth 4 (four) times daily as needed. 100 mL Particia Nearing, PA-C   cetirizine (ZYRTEC ALLERGY) 10 MG tablet Take 1 tablet (10 mg total) by mouth daily. 30 tablet Particia Nearing, PA-C   fluticasone [facility]) 50 MCG/ACT nasal spray Place 1 spray into both nostrils 2 (two) times daily. 16 g Particia Nearing, [place]      PDMP not reviewed this encounter.   Particia Nearing, [place] 12/26/21 [phone]

## 2021-12-26 NOTE — ED Triage Notes (Signed)
Patient states he woke up and he felt like his throat was going into his chest.   He states he could not talk and he was coughing up green mucus  Patient states he took an at home Covid test and it was negative.   Patient states he eels nauseas and his gums are hurting.  Denies Fever

## 2021-12-27 LAB — COVID-19, FLU A+B NAA
Influenza A, NAA: NOT DETECTED
Influenza B, NAA: NOT DETECTED
SARS-CoV-2, NAA: NOT DETECTED

## 2022-08-15 ENCOUNTER — Ambulatory Visit
Admission: EM | Admit: 2022-08-15 | Discharge: 2022-08-15 | Disposition: A | Discharge status: Home / Self Care | Payer: Medicaid Other | Attending: Family Medicine | Admitting: Family Medicine

## 2022-08-15 ENCOUNTER — Encounter: Payer: Self-pay | Admitting: Emergency Medicine

## 2022-08-15 DIAGNOSIS — J4521 Mild intermittent asthma with (acute) exacerbation: Secondary | ICD-10-CM

## 2022-08-15 DIAGNOSIS — J3089 Other allergic rhinitis: Secondary | ICD-10-CM

## 2022-08-15 DIAGNOSIS — B356 Tinea cruris: Secondary | ICD-10-CM

## 2022-08-15 DIAGNOSIS — J0100 Acute maxillary sinusitis, unspecified: Secondary | ICD-10-CM

## 2022-08-15 MED ORDER — ALBUTEROL SULFATE HFA 108 (90 BASE) MCG/ACT IN AERS: 2.0000 | INHALATION_SPRAY | RESPIRATORY_TRACT | 0 refills | Status: DC | PRN

## 2022-08-15 MED ORDER — AMOXICILLIN-POT CLAVULANATE 875-125 MG PO TABS: 1.0000 | ORAL_TABLET | Freq: Two times a day (BID) | ORAL | 0 refills | Status: DC

## 2022-08-15 MED ORDER — NYSTATIN 100000 UNIT/GM EX POWD: 1.0000 | Freq: Three times a day (TID) | CUTANEOUS | 0 refills | Status: DC | PRN

## 2022-08-15 MED ORDER — FLUTICASONE PROPIONATE 50 MCG/ACT NA SUSP: 1.0000 | Freq: Two times a day (BID) | NASAL | 2 refills | Status: DC

## 2022-08-15 MED ORDER — CETIRIZINE HCL 10 MG PO TABS: 10.0000 mg | ORAL_TABLET | Freq: Every day | ORAL | 2 refills | Status: DC

## 2022-08-15 MED ORDER — CLOTRIMAZOLE 1 % EX CREA: TOPICAL_CREAM | CUTANEOUS | 0 refills | Status: DC

## 2022-08-15 NOTE — ED Triage Notes (Signed)
Nasal congestion, cough x 1 month.  Has been taking over the counter products with out relief. Rash on genital area x 1 month.  States rash has become worse.

## 2022-08-15 NOTE — ED Provider Notes (Signed)
[facility]    CSN: [id number] Arrival date & time: 08/15/22  1254      History   Chief Complaint No chief complaint on file.   HPI [name] is a 41 y.o. male.   Patient presenting today with 1 month history of nasal congestion, cough, sinus pressure that has been worsening the past week.  States has been trying over-the-counter cold and congestion medications pretty regularly which he seems to feel helped him during the daytime but symptoms worsen at nighttime.  He has a history of seasonal allergies and asthma not currently on any medications for any of these.  Denies fever, chills, chest pain, shortness of breath, abdominal pain, nausea vomiting or diarrhea.  He is also having an itchy, burning sensation type rash to groin area and scrotal region.  He states this has been for about a month as well, initially attributed to sweating frequently.  Has tried some powder to the area here and there with no relief.  Denies any penile discharge, lesions, bleeding, drainage, dysuria, concern for STDs.   Past Medical History:  Diagnosis Date   ADHD (attention deficit hyperactivity disorder)    Asthma    Patient Active Problem List   Diagnosis Date Noted   Adjustment disorder with mixed anxiety and depressed mood 04/06/2016   Attention deficit hyperactivity disorder (ADHD) 04/06/2016   Suicide ideation 04/04/2016   Past Surgical History:  Procedure Laterality Date   head surgery (as a baby)       Home Medications    Prior to Admission medications   Medication Sig Start Date End Date Taking? Authorizing Provider  amoxicillin-clavulanate (AUGMENTIN) 875-125 MG tablet Take 1 tablet by mouth every 12 (twelve) hours. 08/15/22  Yes Particia Nearing, PA-C  clotrimazole (LOTRIMIN) 1 % cream Apply to affected area 2 times daily 08/15/22  Yes Particia Nearing, PA-C  nystatin (MYCOSTATIN/NYSTOP) powder Apply 1 Application topically 3 (three) times daily as needed.  08/15/22  Yes Particia Nearing, PA-C  albuterol (VENTOLIN HFA) 108 (90 Base) MCG/ACT inhaler Inhale 2 puffs into the lungs every 4 (four) hours as needed for wheezing or shortness of breath. 08/15/22   Particia Nearing, PA-C  benzonatate (TESSALON) 100 MG capsule Take 1 capsule (100 mg total) by mouth 3 (three) times daily as needed for cough. 10/31/18   Samuel Jester, DO  cetirizine (ZYRTEC ALLERGY) 10 MG tablet Take 1 tablet (10 mg total) by mouth daily. 08/15/22   Particia Nearing, PA-C  cyclobenzaprine (FLEXERIL) 10 MG tablet Take 1 tablet (10 mg total) by mouth 2 (two) times daily as needed for muscle spasms. 02/09/20   Avegno, Zachery Dakins, FNP  doxycycline (VIBRAMYCIN) 100 MG capsule Take 1 capsule (100 mg total) by mouth 2 (two) times daily. 12/14/19   Eber Hong, MD  fluticasone (FLONASE) 50 MCG/ACT nasal spray Place 1 spray into both nostrils 2 (two) times daily. 08/15/22   Particia Nearing, PA-C  hydrOXYzine (ATARAX/VISTARIL) 25 MG tablet Take 1 tablet (25 mg total) by mouth every 6 (six) hours. 07/29/18   Geoffery Lyons, MD  ibuprofen (ADVIL) 800 MG tablet Take 1 tablet (800 mg total) by mouth 3 (three) times daily. 02/09/20   Avegno, Zachery Dakins, FNP  omeprazole (PRILOSEC) 20 MG capsule Take 1 capsule (20 mg total) by mouth daily. 05/03/19   Rancour, Jeannett Senior, MD  oseltamivir (TAMIFLU) 75 MG capsule Take 1 capsule (75 mg total) by mouth every 12 (twelve) hours. 02/08/17   Idol,  Raynelle Fanning, PA-C  predniSONE (DELTASONE) 20 MG tablet Take 2 tablets (40 mg total) by mouth daily with breakfast. 08/21/21   Bing Neighbors, FNP  predniSONE (DELTASONE) 20 MG tablet Take 2 tablets (40 mg total) by mouth daily with breakfast. 12/26/21   Particia Nearing, PA-C  promethazine-dextromethorphan (PROMETHAZINE-DM) 6.25-15 MG/5ML syrup Take 5 mLs by mouth 4 (four) times daily as needed. 12/26/21   Particia Nearing, PA-C  sucralfate (CARAFATE) 1 g tablet Take 1 tablet (1 g total) by  mouth 4 (four) times daily -  with meals and at bedtime. 05/03/19   Rancour, Jeannett Senior, MD  traZODone (DESYREL) 50 MG tablet Take 1 tablet (50 mg total) by mouth at bedtime as needed for sleep. 04/06/16   Rankin, Rada Hay, NP   Family History Family History  Problem Relation Age of Onset   Healthy Mother    Healthy Father    Social History Social History   Tobacco Use   Smoking status: Some Days    Packs/day: 0.50    Types: Cigarettes   Smokeless tobacco: Never  Vaping Use   Vaping Use: Never used  Substance Use Topics   Alcohol use: Yes    Comment: occasional   Drug use: Not Currently    Types: Cocaine    Comment: months ago    Allergies   Bee venom  Review of Systems Review of Systems PER HPI  Physical Exam Triage Vital Signs ED Triage Vitals  Enc Vitals Group     BP 08/15/22 1302 125/76     Pulse Rate 08/15/22 1302 60     Resp 08/15/22 1302 18     Temp 08/15/22 1302 (!) 97.4 F (36.3 C)     Temp Source 08/15/22 1302 Oral     SpO2 08/15/22 1302 97 %     Weight --      Height --      Head Circumference --      Peak Flow --      Pain Score 08/15/22 1304 8     Pain Loc --      Pain Edu? --      Excl. in GC? --    No data found.  Updated Vital Signs BP 125/76 (BP Location: Right Arm)   Pulse 60   Temp (!) 97.4 F (36.3 C) (Oral)   Resp 18   SpO2 97%   Visual Acuity Right Eye Distance:   Left Eye Distance:   Bilateral Distance:    Right Eye Near:   Left Eye Near:    Bilateral Near:     Physical Exam Vitals and nursing note reviewed. Exam conducted with a chaperone present.  Constitutional:      Appearance: Normal appearance.  HENT:     Head: Atraumatic.     Right Ear: Tympanic membrane normal.     Left Ear: Tympanic membrane normal.     Nose: Congestion present.     Mouth/Throat:     Mouth: Mucous membranes are moist.     Pharynx: Posterior oropharyngeal erythema present.  Eyes:     Extraocular Movements: Extraocular movements intact.      Conjunctiva/sclera: Conjunctivae normal.  Cardiovascular:     Rate and Rhythm: Normal rate and regular rhythm.  Pulmonary:     Effort: Pulmonary effort is normal.     Breath sounds: Normal breath sounds. No wheezing.  Genitourinary:    Comments: Erythema and mild edema to bilateral groin, scrotal region.  Occasional peeling and  flaking.  No lesions, bleeding, discharge Musculoskeletal:        General: Normal range of motion.     Cervical back: Normal range of motion and neck supple.  Skin:    General: Skin is warm and dry.  Neurological:     General: No focal deficit present.     Mental Status: He is oriented to person, place, and time.  Psychiatric:        Mood and Affect: Mood normal.        Thought Content: Thought content normal.        Judgment: Judgment normal.     UC Treatments / Results  Labs (all labs ordered are listed, but only abnormal results are displayed) Labs Reviewed - No data to display  EKG  Radiology No results found.  Procedures Procedures (including critical care time)  Medications Ordered in UC Medications - No data to display  Initial Impression / Assessment and Plan / UC Course  I have reviewed the triage vital signs and the nursing notes.  Pertinent labs & imaging results that were available during my care of the patient were reviewed by me and considered in my medical decision making (see chart for details).     Suspect maxillary sinusitis secondary to uncontrolled seasonal allergies.  We will treat with Augmentin, restart allergy regimen with Zyrtec and Flonase, discussed supportive over-the-counter medications and home care additionally.  Regarding the tinea cruris, will treat with Lotrimin cream, nystatin powder, keep area clean and dry.  Albuterol inhaler refilled for nighttime use as needed for his asthma which is currently mildly exacerbated due to uncontrolled allergies.  Final Clinical Impressions(s) / UC Diagnoses   Final  diagnoses:  Tinea cruris  Acute maxillary sinusitis, recurrence not specified  Seasonal allergic rhinitis due to other allergic trigger  Mild intermittent asthma with acute exacerbation   Discharge Instructions   None    ED Prescriptions     Medication Sig Dispense Auth. Provider   nystatin (MYCOSTATIN/NYSTOP) powder Apply 1 Application topically 3 (three) times daily as needed. 60 g Particia Nearing, [place]   clotrimazole (LOTRIMIN) 1 % cream Apply to affected area 2 times daily 60 g Particia Nearing, PA-C   fluticasone [facility]) 50 MCG/ACT nasal spray Place 1 spray into both nostrils 2 (two) times daily. 16 g Particia Nearing, [place]   cetirizine (ZYRTEC ALLERGY) 10 MG tablet Take 1 tablet (10 mg total) by mouth daily. 30 tablet Particia Nearing, [place]   albuterol (VENTOLIN HFA) 108 (90 Base) MCG/ACT inhaler Inhale 2 puffs into the lungs every 4 (four) hours as needed for wheezing or shortness of breath. 18 g Particia Nearing, PA-C   amoxicillin-clavulanate (AUGMENTIN) 875-125 MG tablet Take 1 tablet by mouth every 12 (twelve) hours. 14 tablet Particia Nearing, [place]      PDMP not reviewed this encounter.   Particia Nearing, [place] 08/15/22 [phone]

## 2022-08-23 ENCOUNTER — Encounter (HOSPITAL_COMMUNITY): Payer: Self-pay | Admitting: Emergency Medicine

## 2022-08-23 ENCOUNTER — Other Ambulatory Visit: Payer: Self-pay

## 2022-08-23 DIAGNOSIS — X58XXXA Exposure to other specified factors, initial encounter: Secondary | ICD-10-CM | POA: Diagnosis not present

## 2022-08-23 DIAGNOSIS — Z7721 Contact with and (suspected) exposure to potentially hazardous body fluids: Secondary | ICD-10-CM | POA: Insufficient documentation

## 2022-08-23 DIAGNOSIS — S6992XA Unspecified injury of left wrist, hand and finger(s), initial encounter: Secondary | ICD-10-CM | POA: Diagnosis present

## 2022-08-23 DIAGNOSIS — S60512A Abrasion of left hand, initial encounter: Secondary | ICD-10-CM | POA: Diagnosis not present

## 2022-08-23 NOTE — ED Triage Notes (Signed)
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 When he took off his gloves there was an open area to hand where he could have been stuck by needle. Pt is unsure.

## 2022-08-24 ENCOUNTER — Emergency Department (HOSPITAL_COMMUNITY)
Admission: EM | Admit: 2022-08-24 | Discharge: 2022-08-24 | Disposition: A | Discharge status: Home / Self Care | Payer: Worker's Compensation | Attending: Emergency Medicine | Admitting: Emergency Medicine

## 2022-08-24 DIAGNOSIS — Z578 Occupational exposure to other risk factors: Secondary | ICD-10-CM

## 2022-08-24 LAB — RAPID HIV SCREEN (HIV 1/2 AB+AG)
HIV 1/2 Antibodies: NONREACTIVE
HIV-1 P24 Antigen - HIV24: NONREACTIVE

## 2022-08-24 LAB — HEPATITIS PANEL, ACUTE
HCV Ab: NONREACTIVE
Hep A IgM: NONREACTIVE
Hep B C IgM: NONREACTIVE
Hepatitis B Surface Ag: NONREACTIVE

## 2022-08-24 NOTE — ED Provider Notes (Signed)
[facility] EMERGENCY DEPARTMENT Provider Note   CSN: [id number] Arrival date & time: 08/23/22  2225     History  Chief Complaint  Patient presents with   Hand Pain    [name] is a 41 y.o. male.  Patient presents to the emergency department with concerns over a wound on his left hand.  Patient reports that he was taking the trash out of one of the doctors offices tonight when he felt pain on the back of his hand.  After he took his gloves off there was a small wound on the back of the hand with some redness around it.  He is concerned that he might have been stuck by a needle.      Home Medications Prior to Admission medications   Medication Sig Start Date End Date Taking? Authorizing Provider  albuterol (VENTOLIN HFA) 108 (90 Base) MCG/ACT inhaler Inhale 2 puffs into the lungs every 4 (four) hours as needed for wheezing or shortness of breath. 08/15/22   Particia Nearing, PA-C  amoxicillin-clavulanate (AUGMENTIN) 875-125 MG tablet Take 1 tablet by mouth every 12 (twelve) hours. 08/15/22   Particia Nearing, PA-C  benzonatate (TESSALON) 100 MG capsule Take 1 capsule (100 mg total) by mouth 3 (three) times daily as needed for cough. 10/31/18   Samuel Jester, DO  cetirizine (ZYRTEC ALLERGY) 10 MG tablet Take 1 tablet (10 mg total) by mouth daily. 08/15/22   Particia Nearing, PA-C  clotrimazole (LOTRIMIN) 1 % cream Apply to affected area 2 times daily 08/15/22   Particia Nearing, PA-C  cyclobenzaprine (FLEXERIL) 10 MG tablet Take 1 tablet (10 mg total) by mouth 2 (two) times daily as needed for muscle spasms. 02/09/20   Avegno, Zachery Dakins, FNP  doxycycline (VIBRAMYCIN) 100 MG capsule Take 1 capsule (100 mg total) by mouth 2 (two) times daily. 12/14/19   Eber Hong, MD  fluticasone (FLONASE) 50 MCG/ACT nasal spray Place 1 spray into both nostrils 2 (two) times daily. 08/15/22   Particia Nearing, PA-C  hydrOXYzine (ATARAX/VISTARIL) 25 MG tablet Take 1  tablet (25 mg total) by mouth every 6 (six) hours. 07/29/18   Geoffery Lyons, MD  ibuprofen (ADVIL) 800 MG tablet Take 1 tablet (800 mg total) by mouth 3 (three) times daily. 02/09/20   Avegno, Zachery Dakins, FNP  nystatin (MYCOSTATIN/NYSTOP) powder Apply 1 Application topically 3 (three) times daily as needed. 08/15/22   Particia Nearing, PA-C  omeprazole (PRILOSEC) 20 MG capsule Take 1 capsule (20 mg total) by mouth daily. 05/03/19   Rancour, Jeannett Senior, MD  oseltamivir (TAMIFLU) 75 MG capsule Take 1 capsule (75 mg total) by mouth every 12 (twelve) hours. 02/08/17   Burgess Amor, PA-C  predniSONE (DELTASONE) 20 MG tablet Take 2 tablets (40 mg total) by mouth daily with breakfast. 08/21/21   Bing Neighbors, FNP  predniSONE (DELTASONE) 20 MG tablet Take 2 tablets (40 mg total) by mouth daily with breakfast. 12/26/21   Particia Nearing, PA-C  promethazine-dextromethorphan (PROMETHAZINE-DM) 6.25-15 MG/5ML syrup Take 5 mLs by mouth 4 (four) times daily as needed. 12/26/21   Particia Nearing, PA-C  sucralfate (CARAFATE) 1 g tablet Take 1 tablet (1 g total) by mouth 4 (four) times daily -  with meals and at bedtime. 05/03/19   Rancour, Jeannett Senior, MD  traZODone (DESYREL) 50 MG tablet Take 1 tablet (50 mg total) by mouth at bedtime as needed for sleep. 04/06/16   Rankin, Rada Hay, NP  Allergies    Bee venom    Review of Systems   Review of Systems  Physical Exam Updated Vital Signs BP (!) 153/96 (BP Location: Right Arm)   Pulse 79   Temp 98 F (36.7 C) (Oral)   Resp 19   Ht 5\' 10"  (1.778 m)   Wt 92.5 kg   SpO2 96%   BMI 29.27 kg/m  Physical Exam Vitals and nursing note reviewed.  Constitutional:      General: He is not in acute distress.    Appearance: He is well-developed.  HENT:     Head: Normocephalic and atraumatic.     Mouth/Throat:     Mouth: Mucous membranes are moist.  Eyes:     General: Vision grossly intact. Gaze aligned appropriately.     Extraocular Movements:  Extraocular movements intact.     Conjunctiva/sclera: Conjunctivae normal.  Cardiovascular:     Rate and Rhythm: Normal rate and regular rhythm.     Pulses: Normal pulses.     Heart sounds: Normal heart sounds, S1 normal and S2 normal. No murmur heard.    No friction rub. No gallop.  Pulmonary:     Effort: Pulmonary effort is normal. No respiratory distress.     Breath sounds: Normal breath sounds.  Abdominal:     Palpations: Abdomen is soft.     Tenderness: There is no abdominal tenderness. There is no guarding or rebound.     Hernia: No hernia is present.  Musculoskeletal:        General: No swelling.     Cervical back: Full passive range of motion without pain, normal range of motion and neck supple. No pain with movement, spinous process tenderness or muscular tenderness. Normal range of motion.     Right lower leg: No edema.     Left lower leg: No edema.  Skin:    General: Skin is warm and dry.     Capillary Refill: Capillary refill takes less than 2 seconds.     Findings: No ecchymosis, erythema, lesion or wound.     Comments: 3 mm abrasion dorsal aspect of left hand.  No bleeding.  Neurological:     Mental Status: He is oriented to person, place, and time.     GCS: GCS eye subscore is 4. GCS verbal subscore is 5. GCS motor subscore is 6.     Cranial Nerves: Cranial nerves 2-12 are intact.     Sensory: Sensation is intact.     Motor: Motor function is intact. No weakness or abnormal muscle tone.     Coordination: Coordination is intact.  Psychiatric:        Mood and Affect: Mood normal.        Speech: Speech normal.        Behavior: Behavior normal.    ED Results / Procedures / Treatments   Labs (all labs ordered are listed, but only abnormal results are displayed) Labs Reviewed  RAPID HIV SCREEN (HIV 1/2 AB+AG)  HEPATITIS PANEL, ACUTE    EKG None  Radiology No results found.  Procedures Procedures    Medications Ordered in ED Medications - No data to  display  ED Course/ Medical Decision Making/ A&P                           Medical Decision Making Amount and/or Complexity of Data Reviewed Labs: ordered.   Presents with a wound on his hand.  He has  a very tiny very superficial abrasion on the back of his hand.  He does not think it was there before he came to work.  He noticed it after he grabbed a bag of trash out of a doctor's office.  He is concerned that there may have been a needle in the trash.  He threw the bag away, no access to determine if there was medical waste or sharps in the bag.  His tetanus is up-to-date.  Perform baseline labs, refer to occupational health.  It is not clear if this is even a fresh wound.  The area appears to be scabbed over.  There is no independent confirmation that there was a needle in the trash.  This is felt to be a very low risk of wound if there even was exposure.  Discussed postexposure prophylaxis with him.  He declines at this time.  He is to follow-up with [facility].        Final Clinical Impression(s) / ED Diagnoses Final diagnoses:  Employee exposure to body fluids    Rx / DC Orders ED Discharge Orders     None         Keishawn Rajewski, Canary Brim, MD 08/24/22 [phone]

## 2022-08-24 NOTE — Discharge Instructions (Addendum)
Call [phone] in the morning to schedule follow-up with [facility].

## 2022-08-29 ENCOUNTER — Other Ambulatory Visit: Payer: Self-pay | Admitting: Family Medicine

## 2022-08-30 NOTE — Telephone Encounter (Signed)
Unable to refill per protocol, medication not assigned to protocol. Provider not at this practice. Will refuse medication.

## 2022-11-03 DIAGNOSIS — R110 Nausea: Secondary | ICD-10-CM | POA: Diagnosis not present

## 2022-11-03 DIAGNOSIS — Z6829 Body mass index (BMI) 29.0-29.9, adult: Secondary | ICD-10-CM | POA: Diagnosis not present

## 2022-11-03 DIAGNOSIS — E663 Overweight: Secondary | ICD-10-CM | POA: Diagnosis not present

## 2022-12-10 ENCOUNTER — Other Ambulatory Visit: Payer: Self-pay

## 2022-12-10 ENCOUNTER — Emergency Department (HOSPITAL_COMMUNITY)
Admission: EM | Admit: 2022-12-10 | Discharge: 2022-12-10 | Disposition: A | Discharge status: Home / Self Care | Payer: Medicaid Other | Attending: Emergency Medicine | Admitting: Emergency Medicine

## 2022-12-10 ENCOUNTER — Encounter (HOSPITAL_COMMUNITY): Payer: Self-pay

## 2022-12-10 ENCOUNTER — Emergency Department (HOSPITAL_COMMUNITY): Payer: Medicaid Other

## 2022-12-10 DIAGNOSIS — J45909 Unspecified asthma, uncomplicated: Secondary | ICD-10-CM | POA: Diagnosis not present

## 2022-12-10 DIAGNOSIS — Z7951 Long term (current) use of inhaled steroids: Secondary | ICD-10-CM | POA: Insufficient documentation

## 2022-12-10 DIAGNOSIS — Z20822 Contact with and (suspected) exposure to covid-19: Secondary | ICD-10-CM | POA: Diagnosis not present

## 2022-12-10 DIAGNOSIS — B349 Viral infection, unspecified: Secondary | ICD-10-CM

## 2022-12-10 DIAGNOSIS — R509 Fever, unspecified: Secondary | ICD-10-CM | POA: Diagnosis present

## 2022-12-10 DIAGNOSIS — R0602 Shortness of breath: Secondary | ICD-10-CM | POA: Diagnosis not present

## 2022-12-10 LAB — RESP PANEL BY RT-PCR (RSV, FLU A&B, COVID)  RVPGX2
Influenza A by PCR: NEGATIVE
Influenza B by PCR: NEGATIVE
Resp Syncytial Virus by PCR: NEGATIVE
SARS Coronavirus 2 by RT PCR: NEGATIVE

## 2022-12-10 MED ORDER — BENZONATATE 200 MG PO CAPS: 200.0000 mg | ORAL_CAPSULE | Freq: Three times a day (TID) | ORAL | 0 refills | Status: DC | PRN

## 2022-12-10 MED ORDER — IBUPROFEN 800 MG PO TABS: 800.0000 mg | ORAL_TABLET | Freq: Three times a day (TID) | ORAL | 0 refills | Status: DC

## 2022-12-10 MED ORDER — ACETAMINOPHEN 325 MG PO TABS
650.0000 mg | ORAL_TABLET | Freq: Once | ORAL | Status: AC | PRN
  Administered 2022-12-10: 650 mg via ORAL
  Filled 2022-12-10: qty 2

## 2022-12-10 NOTE — ED Provider Notes (Signed)
[facility] EMERGENCY DEPARTMENT Provider Note   CSN: [id number] Arrival date & time: 12/10/22  1106     History  Chief Complaint  Patient presents with   Fever    ESIAH BAZINET is a 41 y.o. male.   Fever Associated symptoms: congestion, cough, headaches and myalgias   Associated symptoms: no chest pain, no diarrhea, no dysuria, no nausea, no sore throat and no vomiting         TREYLAN MCCLINTOCK is a 41 y.o. male hx of asthma who presents to the Emergency Department complaining of fever, cough, generalized bodyaches, frontal headache and crampy abdominal pain.  Has had a cough for several days.  Other symptoms began after exposure to sick contact.  Fever at home, describes headache as gradual in onset.  No neck pain or stiffness, chest pain, shortness of breath.  Denies any vomiting or diarrhea.    Home Medications Prior to Admission medications   Medication Sig Start Date End Date Taking? Authorizing Provider  albuterol (VENTOLIN HFA) 108 (90 Base) MCG/ACT inhaler Inhale 2 puffs into the lungs every 4 (four) hours as needed for wheezing or shortness of breath. 08/15/22   Particia Nearing, PA-C  amoxicillin-clavulanate (AUGMENTIN) 875-125 MG tablet Take 1 tablet by mouth every 12 (twelve) hours. 08/15/22   Particia Nearing, PA-C  benzonatate (TESSALON) 100 MG capsule Take 1 capsule (100 mg total) by mouth 3 (three) times daily as needed for cough. 10/31/18   Samuel Jester, DO  cetirizine (ZYRTEC ALLERGY) 10 MG tablet Take 1 tablet (10 mg total) by mouth daily. 08/15/22   Particia Nearing, PA-C  clotrimazole (LOTRIMIN) 1 % cream Apply to affected area 2 times daily 08/15/22   Particia Nearing, PA-C  cyclobenzaprine (FLEXERIL) 10 MG tablet Take 1 tablet (10 mg total) by mouth 2 (two) times daily as needed for muscle spasms. 02/09/20   Avegno, Zachery Dakins, FNP  doxycycline (VIBRAMYCIN) 100 MG capsule Take 1 capsule (100 mg total) by mouth 2 (two) times daily.  12/14/19   Eber Hong, MD  fluticasone (FLONASE) 50 MCG/ACT nasal spray Place 1 spray into both nostrils 2 (two) times daily. 08/15/22   Particia Nearing, PA-C  hydrOXYzine (ATARAX/VISTARIL) 25 MG tablet Take 1 tablet (25 mg total) by mouth every 6 (six) hours. 07/29/18   Geoffery Lyons, MD  ibuprofen (ADVIL) 800 MG tablet Take 1 tablet (800 mg total) by mouth 3 (three) times daily. 02/09/20   Avegno, Zachery Dakins, FNP  nystatin (MYCOSTATIN/NYSTOP) powder Apply 1 Application topically 3 (three) times daily as needed. 08/15/22   Particia Nearing, PA-C  omeprazole (PRILOSEC) 20 MG capsule Take 1 capsule (20 mg total) by mouth daily. 05/03/19   Rancour, Jeannett Senior, MD  oseltamivir (TAMIFLU) 75 MG capsule Take 1 capsule (75 mg total) by mouth every 12 (twelve) hours. 02/08/17   Burgess Amor, PA-C  predniSONE (DELTASONE) 20 MG tablet Take 2 tablets (40 mg total) by mouth daily with breakfast. 08/21/21   Bing Neighbors, FNP  predniSONE (DELTASONE) 20 MG tablet Take 2 tablets (40 mg total) by mouth daily with breakfast. 12/26/21   Particia Nearing, PA-C  promethazine-dextromethorphan (PROMETHAZINE-DM) 6.25-15 MG/5ML syrup Take 5 mLs by mouth 4 (four) times daily as needed. 12/26/21   Particia Nearing, PA-C  sucralfate (CARAFATE) 1 g tablet Take 1 tablet (1 g total) by mouth 4 (four) times daily -  with meals and at bedtime. 05/03/19   Glynn Octave, MD  traZODone (DESYREL)  50 MG tablet Take 1 tablet (50 mg total) by mouth at bedtime as needed for sleep. 04/06/16   Rankin, Shuvon B, NP      Allergies    Bee venom    Review of Systems   Review of Systems  Constitutional:  Positive for fever.  HENT:  Positive for congestion. Negative for sore throat and trouble swallowing.   Respiratory:  Positive for cough. Negative for shortness of breath and wheezing.   Cardiovascular:  Negative for chest pain.  Gastrointestinal:  Positive for abdominal pain. Negative for diarrhea, nausea and vomiting.   Genitourinary:  Negative for dysuria.  Musculoskeletal:  Positive for myalgias. Negative for back pain, neck pain and neck stiffness.  Neurological:  Positive for headaches. Negative for dizziness, weakness and numbness.    Physical Exam Updated Vital Signs BP (!) 152/89 (BP Location: Right Arm)   Pulse 94   Temp (!) 101.5 F (38.6 C) (Oral)   Resp 20   Ht  (1.778 m)   Wt 89.8 kg   SpO2 100%   BMI 28.41 kg/m  Physical Exam Vitals and nursing note reviewed.  Constitutional:      General: He is not in acute distress.    Appearance: Normal appearance. He is not ill-appearing or toxic-appearing.  HENT:     Mouth/Throat:     Mouth: Mucous membranes are moist.     Pharynx: Oropharynx is clear. No oropharyngeal exudate or posterior oropharyngeal erythema.  Cardiovascular:     Rate and Rhythm: Normal rate and regular rhythm.     Pulses: Normal pulses.  Pulmonary:     Effort: Pulmonary effort is normal. No respiratory distress.  Abdominal:     Palpations: Abdomen is soft.     Tenderness: There is no abdominal tenderness.  Musculoskeletal:        General: Normal range of motion.     Cervical back: Normal range of motion. No rigidity.  Lymphadenopathy:     Cervical: No cervical adenopathy.  Skin:    General: Skin is warm.     Capillary Refill: Capillary refill takes less than 2 seconds.     Findings: No rash.  Neurological:     General: No focal deficit present.     Mental Status: He is alert.     Sensory: No sensory deficit.     Motor: No weakness.     ED Results / Procedures / Treatments   Labs (all labs ordered are listed, but only abnormal results are displayed) Labs Reviewed  RESP PANEL BY RT-PCR (RSV, FLU A&B, COVID)  [id number]    EKG None  Radiology DG Chest 2 View  Result Date: 12/10/2022 CLINICAL DATA:  SOB EXAM: CHEST - 2 VIEW COMPARISON:  CXR 05/03/19 FINDINGS: No pleural effusion. No pneumothorax. No focal airspace opacity. Normal cardiac and  mediastinal contours. No displaced rib fractures. Visualized upper abdomen is unremarkable. IMPRESSION: No focal airspace opacity. Electronically Signed   By: Lorenza Cambridge M.D.   On: 12/10/2022 11:43    Procedures Procedures    Medications Ordered in ED Medications  acetaminophen (TYLENOL) tablet 650 mg (650 mg Oral Given 12/10/22 1127)    ED Course/ Medical Decision Making/ A&P                           Medical Decision Making Patient here with flulike symptoms, endorses cough for several days fever and bodyaches since yesterday.  Fever at home.  Some  crampy abdominal pain, denies vomiting diarrhea.  Endorses sick contacts   On exam, patient nontoxic-appearing.  Febrile with temp of 101.5 here given Tylenol.  Otherwise reassuring exam.  Suspect viral process.  Pneumonia, acute intra-abdominal process, PE all felt less likely.  Amount and/or Complexity of Data Reviewed Labs: ordered.    Details: Respiratory panel negative for COVID, flu, SV. Radiology: ordered.    Details: Chest x-ray without evidence of acute cardiopulmonary process. Discussion of management or test interpretation with external provider(s): Patient reassured.  Symptoms felt viral.  Fever improved after Tylenol.  No clinical signs of dehydration.  Patient appears appropriate for discharge home and symptomatic treatment.  Risk OTC drugs. Prescription drug management.           Final Clinical Impression(s) / ED Diagnoses Final diagnoses:  Viral illness    Rx / DC Orders ED Discharge Orders     None         Kem Parkinson, PA-C 12/10/22 1223    Dorie Rank, MD 12/11/22 [phone]

## 2022-12-10 NOTE — Discharge Instructions (Signed)
Your symptoms are likely coming from a viral illness.  Antibiotics do not work on these type of illnesses.  I recommend that you drink plenty of fluids, rest, you may take Tylenol extra strength 1 every 4 hours, alternate this with the prescription ibuprofen to help with fever and bodyaches.  Tessalon will help with cough.  Follow-up with your doctor for recheck if needed.

## 2022-12-10 NOTE — ED Triage Notes (Signed)
Pt presents with 2 day hx of fever, cough, body aches, HA, and abd pain. Pt had + sick contacts.

## 2023-01-07 ENCOUNTER — Ambulatory Visit
Admission: EM | Admit: 2023-01-07 | Discharge: 2023-01-07 | Disposition: A | Discharge status: Home / Self Care | Payer: Medicaid Other | Attending: Family Medicine | Admitting: Family Medicine

## 2023-01-07 DIAGNOSIS — F909 Attention-deficit hyperactivity disorder, unspecified type: Secondary | ICD-10-CM | POA: Diagnosis not present

## 2023-01-07 DIAGNOSIS — Z1152 Encounter for screening for COVID-19: Secondary | ICD-10-CM | POA: Diagnosis not present

## 2023-01-07 DIAGNOSIS — R058 Other specified cough: Secondary | ICD-10-CM | POA: Insufficient documentation

## 2023-01-07 DIAGNOSIS — J45909 Unspecified asthma, uncomplicated: Secondary | ICD-10-CM | POA: Insufficient documentation

## 2023-01-07 DIAGNOSIS — J069 Acute upper respiratory infection, unspecified: Secondary | ICD-10-CM | POA: Diagnosis not present

## 2023-01-07 DIAGNOSIS — Z7951 Long term (current) use of inhaled steroids: Secondary | ICD-10-CM | POA: Insufficient documentation

## 2023-01-07 MED ORDER — PROMETHAZINE-DM 6.25-15 MG/5ML PO SYRP: 5.0000 mL | ORAL_SOLUTION | Freq: Four times a day (QID) | ORAL | 0 refills | Status: DC | PRN

## 2023-01-07 MED ORDER — FLUTICASONE PROPIONATE 50 MCG/ACT NA SUSP: 1.0000 | Freq: Two times a day (BID) | NASAL | 2 refills | Status: DC

## 2023-01-07 NOTE — ED Provider Notes (Signed)
[facility]    CSN: [id number] Arrival date & time: 01/07/23  1217      History   Chief Complaint Chief Complaint  Patient presents with   Generalized Body Aches   Nasal Congestion    HPI [name] is a 42 y.o. male.   Presenting today with 1 day history of cough, congestion, headache, body aches, fatigue, very mild lightheadedness.  Denies chest pain, shortness of breath, abdominal pain, nausea vomiting or diarrhea.  So far not taking anything over-the-counter for symptoms.  No known sick contacts recently.    Past Medical History:  Diagnosis Date   ADHD (attention deficit hyperactivity disorder)    Asthma     Patient Active Problem List   Diagnosis Date Noted   Adjustment disorder with mixed anxiety and depressed mood 04/06/2016   Attention deficit hyperactivity disorder (ADHD) 04/06/2016   Suicide ideation 04/04/2016    Past Surgical History:  Procedure Laterality Date   head surgery (as a baby)         Home Medications    Prior to Admission medications   Medication Sig Start Date End Date Taking? Authorizing Provider  fluticasone (FLONASE) 50 MCG/ACT nasal spray Place 1 spray into both nostrils 2 (two) times daily. 01/07/23  Yes Volney American, PA-C  promethazine-dextromethorphan (PROMETHAZINE-DM) 6.25-15 MG/5ML syrup Take 5 mLs by mouth 4 (four) times daily as needed. 01/07/23  Yes Volney American, PA-C  albuterol (VENTOLIN HFA) 108 (90 Base) MCG/ACT inhaler Inhale 2 puffs into the lungs every 4 (four) hours as needed for wheezing or shortness of breath. 08/15/22   Volney American, PA-C  amoxicillin-clavulanate (AUGMENTIN) 875-125 MG tablet Take 1 tablet by mouth every 12 (twelve) hours. 08/15/22   Volney American, PA-C  benzonatate (TESSALON) 200 MG capsule Take 1 capsule (200 mg total) by mouth 3 (three) times daily as needed for cough. 12/10/22   Triplett, Tammy, PA-C  cetirizine (ZYRTEC ALLERGY) 10 MG tablet Take  1 tablet (10 mg total) by mouth daily. 08/15/22   Volney American, PA-C  clotrimazole (LOTRIMIN) 1 % cream Apply to affected area 2 times daily 08/15/22   Volney American, PA-C  cyclobenzaprine (FLEXERIL) 10 MG tablet Take 1 tablet (10 mg total) by mouth 2 (two) times daily as needed for muscle spasms. 02/09/20   Avegno, Darrelyn Hillock, FNP  doxycycline (VIBRAMYCIN) 100 MG capsule Take 1 capsule (100 mg total) by mouth 2 (two) times daily. 12/14/19   Noemi Chapel, MD  fluticasone (FLONASE) 50 MCG/ACT nasal spray Place 1 spray into both nostrils 2 (two) times daily. 08/15/22   Volney American, PA-C  hydrOXYzine (ATARAX/VISTARIL) 25 MG tablet Take 1 tablet (25 mg total) by mouth every 6 (six) hours. 07/29/18   Veryl Speak, MD  ibuprofen (ADVIL) 800 MG tablet Take 1 tablet (800 mg total) by mouth 3 (three) times daily. Take with food 12/10/22   Triplett, Tammy, PA-C  nystatin (MYCOSTATIN/NYSTOP) powder Apply 1 Application topically 3 (three) times daily as needed. 08/15/22   Volney American, PA-C  omeprazole (PRILOSEC) 20 MG capsule Take 1 capsule (20 mg total) by mouth daily. 05/03/19   Rancour, Annie Main, MD  oseltamivir (TAMIFLU) 75 MG capsule Take 1 capsule (75 mg total) by mouth every 12 (twelve) hours. 02/08/17   Evalee Jefferson, PA-C  predniSONE (DELTASONE) 20 MG tablet Take 2 tablets (40 mg total) by mouth daily with breakfast. 08/21/21   Scot Jun, NP  predniSONE (DELTASONE) 20 MG  tablet Take 2 tablets (40 mg total) by mouth daily with breakfast. 12/26/21   Volney American, PA-C  promethazine-dextromethorphan (PROMETHAZINE-DM) 6.25-15 MG/5ML syrup Take 5 mLs by mouth 4 (four) times daily as needed. 12/26/21   Volney American, PA-C  sucralfate (CARAFATE) 1 g tablet Take 1 tablet (1 g total) by mouth 4 (four) times daily -  with meals and at bedtime. 05/03/19   Rancour, Annie Main, MD  traZODone (DESYREL) 50 MG tablet Take 1 tablet (50 mg total) by mouth at bedtime as  needed for sleep. 04/06/16   Rankin, Mercy Moore, NP    Family History Family History  Problem Relation Age of Onset   Healthy Mother    Healthy Father     Social History Social History   Tobacco Use   Smoking status: Every Day    Packs/day: 0.50    Types: Cigarettes   Smokeless tobacco: Never  Vaping Use   Vaping Use: Never used  Substance Use Topics   Alcohol use: Yes    Comment: occasional   Drug use: Not Currently    Types: Cocaine    Comment: 2016     Allergies   Bee venom   Review of Systems Review of Systems Per HPI  Physical Exam Triage Vital Signs ED Triage Vitals  Enc Vitals Group     BP 01/07/23 1232 137/74     Pulse Rate 01/07/23 1232 85     Resp 01/07/23 1232 16     Temp 01/07/23 1232 98.3 F (36.8 C)     Temp Source 01/07/23 1232 Oral     SpO2 01/07/23 1232 97 %     Weight --      Height --      Head Circumference --      Peak Flow --      Pain Score 01/07/23 1233 0     Pain Loc --      Pain Edu? --      Excl. in [place]? --    No data found.  Updated Vital Signs BP 137/74 (BP Location: Right Arm)   Pulse 85   Temp 98.3 F (36.8 C) (Oral)   Resp 16   SpO2 97%   Visual Acuity Right Eye Distance:   Left Eye Distance:   Bilateral Distance:    Right Eye Near:   Left Eye Near:    Bilateral Near:     Physical Exam Vitals and nursing note reviewed.  Constitutional:      Appearance: He is well-developed.  HENT:     Head: Atraumatic.     Right Ear: External ear normal.     Left Ear: External ear normal.     Nose: Rhinorrhea present.     Mouth/Throat:     Pharynx: Posterior oropharyngeal erythema present. No oropharyngeal exudate.  Eyes:     Conjunctiva/sclera: Conjunctivae normal.     Pupils: Pupils are equal, round, and reactive to light.  Cardiovascular:     Rate and Rhythm: Normal rate and regular rhythm.  Pulmonary:     Effort: Pulmonary effort is normal. No respiratory distress.     Breath sounds: No wheezing or rales.   Musculoskeletal:        General: Normal range of motion.     Cervical back: Normal range of motion and neck supple.  Lymphadenopathy:     Cervical: No cervical adenopathy.  Skin:    General: Skin is warm and dry.  Neurological:  Mental Status: He is alert and oriented to person, place, and time.  Psychiatric:        Behavior: Behavior normal.      UC Treatments / Results  Labs (all labs ordered are listed, but only abnormal results are displayed) Labs Reviewed  SARS CORONAVIRUS 2 (TAT 6-24 HRS)    EKG   Radiology No results found.  Procedures Procedures (including critical care time)  Medications Ordered in UC Medications - No data to display  Initial Impression / Assessment and Plan / UC Course  I have reviewed the triage vital signs and the nursing notes.  Pertinent labs & imaging results that were available during my care of the patient were reviewed by me and considered in my medical decision making (see chart for details).     Vitals and exam reassuring and suggestive of a viral upper respiratory infection.  COVID test pending, treat with Phenergan DM, Flonase, over-the-counter supportive medications and home care.  Return for worsening symptoms.  Final Clinical Impressions(s) / UC Diagnoses   Final diagnoses:  Viral URI with cough   Discharge Instructions   None    ED Prescriptions     Medication Sig Dispense Auth. Provider   promethazine-dextromethorphan (PROMETHAZINE-DM) 6.25-15 MG/5ML syrup Take 5 mLs by mouth 4 (four) times daily as needed. 100 mL Particia Nearing, PA-C   fluticasone [facility]) 50 MCG/ACT nasal spray Place 1 spray into both nostrils 2 (two) times daily. 16 g Particia Nearing, [place]      PDMP not reviewed this encounter.   Particia Nearing, [place] 01/07/23 1352

## 2023-01-07 NOTE — ED Triage Notes (Signed)
Fatigue, congestion, cough, headache, body aches, dizziness that started this morning. Not taking any OTC medication.

## 2023-01-08 LAB — SARS CORONAVIRUS 2 (TAT 6-24 HRS): SARS Coronavirus 2: NEGATIVE

## 2023-03-18 ENCOUNTER — Encounter (HOSPITAL_COMMUNITY): Payer: Self-pay | Admitting: *Deleted

## 2023-03-18 ENCOUNTER — Other Ambulatory Visit: Payer: Self-pay

## 2023-03-18 ENCOUNTER — Emergency Department (HOSPITAL_COMMUNITY)
Admission: EM | Admit: 2023-03-18 | Discharge: 2023-03-18 | Disposition: A | Discharge status: Home / Self Care | Payer: Medicaid Other | Attending: Emergency Medicine | Admitting: Emergency Medicine

## 2023-03-18 DIAGNOSIS — U071 COVID-19: Secondary | ICD-10-CM | POA: Diagnosis not present

## 2023-03-18 DIAGNOSIS — R509 Fever, unspecified: Secondary | ICD-10-CM | POA: Diagnosis present

## 2023-03-18 LAB — RESP PANEL BY RT-PCR (RSV, FLU A&B, COVID)  RVPGX2
Influenza A by PCR: NEGATIVE
Influenza B by PCR: NEGATIVE
Resp Syncytial Virus by PCR: NEGATIVE
SARS Coronavirus 2 by RT PCR: POSITIVE — AB

## 2023-03-18 MED ORDER — PAXLOVID (300/100) 20 X 150 MG & 10 X 100MG PO TBPK: 3.0000 | ORAL_TABLET | Freq: Two times a day (BID) | ORAL | 0 refills | Status: AC

## 2023-03-18 MED ORDER — ONDANSETRON 4 MG PO TBDP
4.0000 mg | ORAL_TABLET | Freq: Once | ORAL | Status: AC
  Administered 2023-03-18: 4 mg via ORAL
  Filled 2023-03-18: qty 1

## 2023-03-18 NOTE — ED Provider Notes (Signed)
[facility] Provider Note   CSN: [id number] Arrival date & time: 03/18/23  [id number]     History  Chief Complaint  Patient presents with   Fever    [name] is a 42 y.o. male.  He has no significant medical history.  He is complaining of feeling sick for 2 days with nausea, achiness in his legs, nasal congestion.  Has felt hot and cold but has not checked his temperature.  Nonproductive cough.  He has been taking Tylenol flu with some minimal improvement.  Denies any vomiting or diarrhea no urinary symptoms no sick contacts or recent travel.  The history is provided by the patient.  Influenza Presenting symptoms: cough, fatigue, myalgias, nausea and rhinorrhea   Severity:  Moderate Onset quality:  Gradual Duration:  2 days Progression:  Unchanged Chronicity:  New Relieved by:  Nothing Worsened by:  Nothing Ineffective treatments:  OTC medications Associated symptoms: chills, decreased appetite, decreased physical activity and nasal congestion   Associated symptoms: no mental status change and no syncope   Risk factors: no sick contacts        Home Medications Prior to Admission medications   Medication Sig Start Date End Date Taking? Authorizing Provider  albuterol (VENTOLIN HFA) 108 (90 Base) MCG/ACT inhaler Inhale 2 puffs into the lungs every 4 (four) hours as needed for wheezing or shortness of breath. 08/15/22   Volney American, PA-C  amoxicillin-clavulanate (AUGMENTIN) 875-125 MG tablet Take 1 tablet by mouth every 12 (twelve) hours. 08/15/22   Volney American, PA-C  benzonatate (TESSALON) 200 MG capsule Take 1 capsule (200 mg total) by mouth 3 (three) times daily as needed for cough. 12/10/22   Triplett, Tammy, PA-C  cetirizine (ZYRTEC ALLERGY) 10 MG tablet Take 1 tablet (10 mg total) by mouth daily. 08/15/22   Volney American, PA-C  clotrimazole (LOTRIMIN) 1 % cream Apply to affected area 2 times daily  08/15/22   Volney American, PA-C  cyclobenzaprine (FLEXERIL) 10 MG tablet Take 1 tablet (10 mg total) by mouth 2 (two) times daily as needed for muscle spasms. 02/09/20   Avegno, Darrelyn Hillock, FNP  doxycycline (VIBRAMYCIN) 100 MG capsule Take 1 capsule (100 mg total) by mouth 2 (two) times daily. 12/14/19   Noemi Chapel, MD  fluticasone (FLONASE) 50 MCG/ACT nasal spray Place 1 spray into both nostrils 2 (two) times daily. 08/15/22   Volney American, PA-C  fluticasone [facility]) 50 MCG/ACT nasal spray Place 1 spray into both nostrils 2 (two) times daily. 01/07/23   Volney American, PA-C  hydrOXYzine (ATARAX/VISTARIL) 25 MG tablet Take 1 tablet (25 mg total) by mouth every 6 (six) hours. 07/29/18   Veryl Speak, MD  ibuprofen (ADVIL) 800 MG tablet Take 1 tablet (800 mg total) by mouth 3 (three) times daily. Take with food 12/10/22   Triplett, Tammy, PA-C  nystatin (MYCOSTATIN/NYSTOP) powder Apply 1 Application topically 3 (three) times daily as needed. 08/15/22   Volney American, PA-C  omeprazole (PRILOSEC) 20 MG capsule Take 1 capsule (20 mg total) by mouth daily. 05/03/19   Rancour, Annie Main, MD  oseltamivir (TAMIFLU) 75 MG capsule Take 1 capsule (75 mg total) by mouth every 12 (twelve) hours. 02/08/17   Evalee Jefferson, PA-C  predniSONE (DELTASONE) 20 MG tablet Take 2 tablets (40 mg total) by mouth daily with breakfast. 08/21/21   Scot Jun, NP  predniSONE (DELTASONE) 20 MG tablet Take 2 tablets (40 mg total) by  mouth daily with breakfast. 12/26/21   Volney American, PA-C  promethazine-dextromethorphan (PROMETHAZINE-DM) 6.25-15 MG/5ML syrup Take 5 mLs by mouth 4 (four) times daily as needed. 12/26/21   Volney American, PA-C  promethazine-dextromethorphan (PROMETHAZINE-DM) 6.25-15 MG/5ML syrup Take 5 mLs by mouth 4 (four) times daily as needed. 01/07/23   Volney American, PA-C  sucralfate (CARAFATE) 1 g tablet Take 1 tablet (1 g total) by mouth 4 (four) times  daily -  with meals and at bedtime. 05/03/19   Rancour, Annie Main, MD  traZODone (DESYREL) 50 MG tablet Take 1 tablet (50 mg total) by mouth at bedtime as needed for sleep. 04/06/16   Rankin, Shuvon B, NP      Allergies    Bee venom    Review of Systems   Review of Systems  Constitutional:  Positive for chills, decreased appetite and fatigue.  HENT:  Positive for congestion and rhinorrhea.   Eyes:  Negative for visual disturbance.  Respiratory:  Positive for cough.   Cardiovascular:  Negative for chest pain.  Gastrointestinal:  Positive for nausea.  Musculoskeletal:  Positive for myalgias.    Physical Exam Updated Vital Signs BP 118/87   Pulse 66   Temp 97.9 F (36.6 C) (Oral)   Resp 16   Ht 5\' 10"  (1.778 m)   Wt 89.8 kg   SpO2 96%   BMI 28.41 kg/m  Physical Exam Vitals and nursing note reviewed.  Constitutional:      General: He is not in acute distress.    Appearance: Normal appearance. He is well-developed.  HENT:     Head: Normocephalic and atraumatic.  Eyes:     Conjunctiva/sclera: Conjunctivae normal.  Cardiovascular:     Rate and Rhythm: Normal rate and regular rhythm.     Heart sounds: No murmur heard. Pulmonary:     Effort: Pulmonary effort is normal. No respiratory distress.     Breath sounds: Normal breath sounds.  Abdominal:     Palpations: Abdomen is soft.     Tenderness: There is no abdominal tenderness.  Musculoskeletal:        General: No swelling.     Cervical back: Neck supple.  Skin:    General: Skin is warm and dry.     Capillary Refill: Capillary refill takes less than 2 seconds.  Neurological:     General: No focal deficit present.     Mental Status: He is alert.     ED Results / Procedures / Treatments   Labs (all labs ordered are listed, but only abnormal results are displayed) Labs Reviewed  RESP PANEL BY RT-PCR (RSV, FLU A&B, COVID)  [id number] - Abnormal; Notable for the following components:      Result Value   SARS Coronavirus 2  by RT PCR POSITIVE (*)    All other components within normal limits    EKG None  Radiology No results found.  Procedures Procedures    Medications Ordered in ED Medications - No data to display  ED Course/ Medical Decision Making/ A&P Clinical Course as of 03/18/23 1702  Mon Mar 18, 2023  0945 Patient tested positive for COVID.  He is otherwise satting well in no distress no indications for admission.  Reviewed this with patient and will prescribe Paxlovid.  Return instructions discussed [MB]    Clinical Course User Index [MB] Hayden Rasmussen, MD  Medical Decision Making Risk Prescription drug management.   This patient complains of fever body aches cough; this involves an extensive number of treatment Options and is a complaint that carries with it a high risk of complications and morbidity. The differential includes COVID, flu, pneumonia  I ordered, reviewed and interpreted labs, which included COVID-positive Previous records obtained and reviewed in epic no recent admissions Social determinants considered, no significant barriers Critical Interventions: None  After the interventions stated above, I reevaluated the patient and found patient to be oxygenating well in no distress Admission and further testing considered, no indications for admission.  Will provide Paxlovid and discussed other symptomatic treatment.  Return instructions discussed         Final Clinical Impression(s) / ED Diagnoses Final diagnoses:  COVID-19 virus infection    Rx / DC Orders ED Discharge Orders          Ordered    nirmatrelvir & ritonavir (PAXLOVID, 300/100,) 20 x 150 MG & 10 x 100MG  TBPK  2 times daily        03/18/23 0946              Hayden Rasmussen, MD 03/18/23 [phone]

## 2023-03-18 NOTE — ED Triage Notes (Signed)
Pt c/o headache and fever that started Saturday. Yesterday pt starting having no appetite, feeling "queasy" and congestion. Pt took Tylenol Flu this morning around 0600 this morning with no relief.

## 2023-03-20 ENCOUNTER — Encounter (HOSPITAL_COMMUNITY): Payer: Self-pay | Admitting: Emergency Medicine

## 2023-03-20 ENCOUNTER — Ambulatory Visit: Payer: Self-pay | Admitting: *Deleted

## 2023-03-20 ENCOUNTER — Emergency Department (HOSPITAL_COMMUNITY)
Admission: EM | Admit: 2023-03-20 | Discharge: 2023-03-21 | Disposition: A | Discharge status: Home / Self Care | Payer: Medicaid Other | Attending: Emergency Medicine | Admitting: Emergency Medicine

## 2023-03-20 ENCOUNTER — Other Ambulatory Visit: Payer: Self-pay

## 2023-03-20 DIAGNOSIS — R112 Nausea with vomiting, unspecified: Secondary | ICD-10-CM | POA: Insufficient documentation

## 2023-03-20 DIAGNOSIS — Z8616 Personal history of COVID-19: Secondary | ICD-10-CM | POA: Diagnosis not present

## 2023-03-20 DIAGNOSIS — R197 Diarrhea, unspecified: Secondary | ICD-10-CM | POA: Diagnosis not present

## 2023-03-20 LAB — CBC WITH DIFFERENTIAL/PLATELET
Abs Immature Granulocytes: 0.01 10*3/uL (ref 0.00–0.07)
Basophils Absolute: 0 10*3/uL (ref 0.0–0.1)
Basophils Relative: 1 %
Eosinophils Absolute: 0 10*3/uL (ref 0.0–0.5)
Eosinophils Relative: 1 %
HCT: 48.9 % (ref 39.0–52.0)
Hemoglobin: 16.2 g/dL (ref 13.0–17.0)
Immature Granulocytes: 0 %
Lymphocytes Relative: 14 %
Lymphs Abs: 0.6 10*3/uL — ABNORMAL LOW (ref 0.7–4.0)
MCH: 28.2 pg (ref 26.0–34.0)
MCHC: 33.1 g/dL (ref 30.0–36.0)
MCV: 85 fL (ref 80.0–100.0)
Monocytes Absolute: 0.4 10*3/uL (ref 0.1–1.0)
Monocytes Relative: 8 %
Neutro Abs: 3.3 10*3/uL (ref 1.7–7.7)
Neutrophils Relative %: 76 %
Platelets: 199 10*3/uL (ref 150–400)
RBC: 5.75 MIL/uL (ref 4.22–5.81)
RDW: 13 % (ref 11.5–15.5)
WBC: 4.3 10*3/uL (ref 4.0–10.5)
nRBC: 0 % (ref 0.0–0.2)

## 2023-03-20 LAB — COMPREHENSIVE METABOLIC PANEL
ALT: 23 U/L (ref 0–44)
AST: 14 U/L — ABNORMAL LOW (ref 15–41)
Albumin: 4.2 g/dL (ref 3.5–5.0)
Alkaline Phosphatase: 121 U/L (ref 38–126)
Anion gap: 8 (ref 5–15)
BUN: 16 mg/dL (ref 6–20)
CO2: 26 mmol/L (ref 22–32)
Calcium: 8.5 mg/dL — ABNORMAL LOW (ref 8.9–10.3)
Chloride: 107 mmol/L (ref 98–111)
Creatinine, Ser: 0.86 mg/dL (ref 0.61–1.24)
GFR, Estimated: >60 mL/min (ref >60)
Glucose, Bld: 116 mg/dL — ABNORMAL HIGH (ref 70–99)
Potassium: 3.6 mmol/L (ref 3.5–5.1)
Sodium: 141 mmol/L (ref 135–145)
Total Bilirubin: 1 mg/dL (ref 0.3–1.2)
Total Protein: 7.1 g/dL (ref 6.5–8.1)

## 2023-03-20 LAB — LIPASE, BLOOD: Lipase: 25 U/L (ref 11–51)

## 2023-03-20 MED ORDER — LACTATED RINGERS IV BOLUS
1000.0000 mL | Freq: Once | INTRAVENOUS | Status: AC
  Administered 2023-03-20: 1000 mL via INTRAVENOUS

## 2023-03-20 MED ORDER — ONDANSETRON 4 MG PO TBDP
4.0000 mg | ORAL_TABLET | Freq: Once | ORAL | Status: AC
  Administered 2023-03-20: 4 mg via ORAL
  Filled 2023-03-20: qty 1

## 2023-03-20 MED ORDER — ONDANSETRON 4 MG PO TBDP: ORAL_TABLET | ORAL | 0 refills | Status: DC

## 2023-03-20 MED ORDER — LOPERAMIDE HCL 2 MG PO CAPS: 2.0000 mg | ORAL_CAPSULE | Freq: Four times a day (QID) | ORAL | 0 refills | Status: DC | PRN

## 2023-03-20 NOTE — ED Provider Triage Note (Signed)
Emergency Medicine Provider Triage Evaluation Note  [name] , a 42 y.o. male  was evaluated in triage.  Pt complains of NVD starting today. Patient on Paxlovid. Denies any belly pain. No fevers at home. He reports he actually feels better, but it is the vomiting that has been bothering him. Three episodes of diarrhea. Not black or bloody. No dysuria or hematuria.  Review of Systems  Positive:  Negative:   Physical Exam  BP (!) 136/94 (BP Location: Right Arm)   Pulse 88   Temp 98.3 F (36.8 C) (Oral)   Resp 18   Ht 5\' 10"  (1.778 m)   Wt 89.8 kg   SpO2 97%   BMI 28.41 kg/m  Gen:   Awake, no distress   Resp:  Normal effort  MSK:   Moves extremities without difficulty  Other:  Abdomen soft and non tender  Medical Decision Making  Medically screening exam initiated at 10:50 PM.  Appropriate orders placed.  KEAHI DEGENHARDT was informed that the remainder of the evaluation will be completed by another provider, this initial triage assessment does not replace that evaluation, and the importance of remaining in the ED until their evaluation is complete.  IV fluids, zofran, and labs ordered. Abdomen soft and non tender.    Sherrell Puller, PA-C 03/20/23 2251

## 2023-03-20 NOTE — ED Triage Notes (Signed)
Pt reports he was diagnosed with COVID x 2 days ago and was Rx'ed Paxlovid, reports n/v/d starting today and feels it is a side effect from the medication

## 2023-03-20 NOTE — Discharge Instructions (Signed)
Zofran as needed for nausea, use Imodium as needed for diarrhea.  You can stop the Paxlovid if you think it is causing your symptoms.

## 2023-03-20 NOTE — ED Provider Notes (Signed)
[facility] Provider Note   CSN: [id number] Arrival date & time: 03/20/23  1955     History  Chief Complaint  Patient presents with   Emesis    [name] is a 42 y.o. male.  Patient presents to the emergency department for evaluation of nausea, vomiting, diarrhea.  Patient reports that he has been feeling poorly for several days.  He was seen in the ED 2 days ago and diagnosed with COVID.  He has been on Paxlovid since then.  Patient reports that he started with diarrhea this morning and then developed nausea and vomiting.  He has not been able to hold anything down.  No significant abdominal pain.       Home Medications Prior to Admission medications   Medication Sig Start Date End Date Taking? Authorizing Provider  loperamide (IMODIUM) 2 MG capsule Take 1 capsule (2 mg total) by mouth 4 (four) times daily as needed for diarrhea or loose stools. 03/20/23  Yes Cherokee Clowers, Gwenyth Allegra, MD  ondansetron (ZOFRAN-ODT) 4 MG disintegrating tablet 4mg  ODT q4 hours prn nausea/vomit 03/20/23  Yes Berwyn Bigley, Gwenyth Allegra, MD  albuterol (VENTOLIN HFA) 108 (90 Base) MCG/ACT inhaler Inhale 2 puffs into the lungs every 4 (four) hours as needed for wheezing or shortness of breath. 08/15/22   Volney American, PA-C  amoxicillin-clavulanate (AUGMENTIN) 875-125 MG tablet Take 1 tablet by mouth every 12 (twelve) hours. 08/15/22   Volney American, PA-C  benzonatate (TESSALON) 200 MG capsule Take 1 capsule (200 mg total) by mouth 3 (three) times daily as needed for cough. 12/10/22   Triplett, Tammy, PA-C  cetirizine (ZYRTEC ALLERGY) 10 MG tablet Take 1 tablet (10 mg total) by mouth daily. 08/15/22   Volney American, PA-C  clotrimazole (LOTRIMIN) 1 % cream Apply to affected area 2 times daily 08/15/22   Volney American, PA-C  cyclobenzaprine (FLEXERIL) 10 MG tablet Take 1 tablet (10 mg total) by mouth 2 (two) times daily as needed for muscle  spasms. 02/09/20   Avegno, Darrelyn Hillock, FNP  doxycycline (VIBRAMYCIN) 100 MG capsule Take 1 capsule (100 mg total) by mouth 2 (two) times daily. 12/14/19   Noemi Chapel, MD  fluticasone (FLONASE) 50 MCG/ACT nasal spray Place 1 spray into both nostrils 2 (two) times daily. 08/15/22   Volney American, PA-C  fluticasone [facility]) 50 MCG/ACT nasal spray Place 1 spray into both nostrils 2 (two) times daily. 01/07/23   Volney American, PA-C  hydrOXYzine (ATARAX/VISTARIL) 25 MG tablet Take 1 tablet (25 mg total) by mouth every 6 (six) hours. 07/29/18   Veryl Speak, MD  ibuprofen (ADVIL) 800 MG tablet Take 1 tablet (800 mg total) by mouth 3 (three) times daily. Take with food 12/10/22   Triplett, Tammy, PA-C  nirmatrelvir & ritonavir (PAXLOVID, 300/100,) 20 x 150 MG & 10 x 100MG  TBPK Take 3 tablets by mouth 2 (two) times daily for 5 days. 03/18/23 03/23/23  Hayden Rasmussen, MD  nystatin (MYCOSTATIN/NYSTOP) powder Apply 1 Application topically 3 (three) times daily as needed. 08/15/22   Volney American, PA-C  omeprazole (PRILOSEC) 20 MG capsule Take 1 capsule (20 mg total) by mouth daily. 05/03/19   Rancour, Annie Main, MD  oseltamivir (TAMIFLU) 75 MG capsule Take 1 capsule (75 mg total) by mouth every 12 (twelve) hours. 02/08/17   Evalee Jefferson, PA-C  predniSONE (DELTASONE) 20 MG tablet Take 2 tablets (40 mg total) by mouth daily with breakfast. 08/21/21  Scot Jun, NP  predniSONE (DELTASONE) 20 MG tablet Take 2 tablets (40 mg total) by mouth daily with breakfast. 12/26/21   Volney American, PA-C  promethazine-dextromethorphan (PROMETHAZINE-DM) 6.25-15 MG/5ML syrup Take 5 mLs by mouth 4 (four) times daily as needed. 12/26/21   Volney American, PA-C  promethazine-dextromethorphan (PROMETHAZINE-DM) 6.25-15 MG/5ML syrup Take 5 mLs by mouth 4 (four) times daily as needed. 01/07/23   Volney American, PA-C  sucralfate (CARAFATE) 1 g tablet Take 1 tablet (1 g total) by mouth 4  (four) times daily -  with meals and at bedtime. 05/03/19   Rancour, Annie Main, MD  traZODone (DESYREL) 50 MG tablet Take 1 tablet (50 mg total) by mouth at bedtime as needed for sleep. 04/06/16   Rankin, Shuvon B, NP      Allergies    Bee venom    Review of Systems   Review of Systems  Physical Exam Updated Vital Signs BP (!) 136/94 (BP Location: Right Arm)   Pulse 88   Temp 98.3 F (36.8 C) (Oral)   Resp 18   Ht 5\' 10"  (1.778 m)   Wt 89.8 kg   SpO2 97%   BMI 28.41 kg/m  Physical Exam Vitals and nursing note reviewed.  Constitutional:      General: He is not in acute distress.    Appearance: He is well-developed.  HENT:     Head: Normocephalic and atraumatic.     Mouth/Throat:     Mouth: Mucous membranes are moist.  Eyes:     General: Vision grossly intact. Gaze aligned appropriately.     Extraocular Movements: Extraocular movements intact.     Conjunctiva/sclera: Conjunctivae normal.  Cardiovascular:     Rate and Rhythm: Normal rate and regular rhythm.     Pulses: Normal pulses.     Heart sounds: Normal heart sounds, S1 normal and S2 normal. No murmur heard.    No friction rub. No gallop.  Pulmonary:     Effort: Pulmonary effort is normal. No respiratory distress.     Breath sounds: Normal breath sounds.  Abdominal:     Palpations: Abdomen is soft.     Tenderness: There is no abdominal tenderness. There is no guarding or rebound.     Hernia: No hernia is present.  Musculoskeletal:        General: No swelling.     Cervical back: Full passive range of motion without pain, normal range of motion and neck supple. No pain with movement, spinous process tenderness or muscular tenderness. Normal range of motion.     Right lower leg: No edema.     Left lower leg: No edema.  Skin:    General: Skin is warm and dry.     Capillary Refill: Capillary refill takes less than 2 seconds.     Findings: No ecchymosis, erythema, lesion or wound.  Neurological:     Mental Status: He  is alert and oriented to person, place, and time.     GCS: GCS eye subscore is 4. GCS verbal subscore is 5. GCS motor subscore is 6.     Cranial Nerves: Cranial nerves 2-12 are intact.     Sensory: Sensation is intact.     Motor: Motor function is intact. No weakness or abnormal muscle tone.     Coordination: Coordination is intact.  Psychiatric:        Mood and Affect: Mood normal.        Speech: Speech normal.  Behavior: Behavior normal.     ED Results / Procedures / Treatments   Labs (all labs ordered are listed, but only abnormal results are displayed) Labs Reviewed  CBC WITH DIFFERENTIAL/PLATELET - Abnormal; Notable for the following components:      Result Value   Lymphs Abs 0.6 (*)    All other components within normal limits  URINALYSIS, ROUTINE W REFLEX MICROSCOPIC  COMPREHENSIVE METABOLIC PANEL  LIPASE, BLOOD    EKG None  Radiology No results found.  Procedures Procedures    Medications Ordered in ED Medications  lactated ringers bolus 1,000 mL (1,000 mLs Intravenous New Bag/Given 03/20/23 2301)  ondansetron (ZOFRAN-ODT) disintegrating tablet 4 mg (4 mg Oral Given 03/20/23 2257)    ED Course/ Medical Decision Making/ A&P                             Medical Decision Making  Presents to the emergency department for evaluation of nausea, vomiting, diarrhea.  Differential diagnosis considered includes, but not limited to: Gastroenteritis; colitis; SBO; med reaction  Patient with nausea, vomiting, diarrhea.  Lab work unremarkable.  Patient's abdominal exam is benign, nontender.  Symptoms possibly secondary to his COVID infection.  Alternatively he is concerned that might be secondary to the Paxlovid.  He appears well, I have counseled him that he can stop the Paxlovid.  Will treat him symptomatically for nausea, vomiting and diarrhea.  Patient hydrated here in the ED and has done well.        Final Clinical Impression(s) / ED Diagnoses Final  diagnoses:  Nausea vomiting and diarrhea    Rx / DC Orders ED Discharge Orders          Ordered    ondansetron (ZOFRAN-ODT) 4 MG disintegrating tablet        03/20/23 2320    loperamide (IMODIUM) 2 MG capsule  4 times daily PRN        03/20/23 2320              Orpah Greek, MD 03/20/23 2320

## 2023-03-20 NOTE — Telephone Encounter (Signed)
Reason for Disposition  Nausea from medicine  Answer Assessment - Initial Assessment Questions 1. NAME of MEDICINE: "What medicine(s) are you calling about?"     I came in Monday.   I am positive for Covid.   They didn't tell me the side effects of my medications.     I woke up today with vomiting.  I can't keep food.    Went to the ED Monday.    I was needing to know the side effects of the medicine I was given.    Paxlovid.     I can't keep anything down.   It's just today I started vomited.   I had some diarrhea earlier but not now.    I wasn't vomiting on Monday.    2. QUESTION: "What is your question?" (e.g., double dose of medicine, side effect)     Does Paxlovid make you vomit?   I haven't been anywhere since diagnosed with Covid.    3. PRESCRIBER: "Who prescribed the medicine?" Reason: if prescribed by specialist, call should be referred to that group.     Doctor in the ED 4. SYMPTOMS: "Do you have any symptoms?" If Yes, ask: "What symptoms are you having?"  "How bad are the symptoms (e.g., mild, moderate, severe)     Vomiting.   Does Paxlovid make you vomit?    I can't keep anything down. 5. PREGNANCY:  "Is there any chance that you are pregnant?" "When was your last menstrual period?"     N/A  Answer Assessment - Initial Assessment Questions 1. MED: "Which med is your child taking?" "How many times per day?"     I was in the ED Monday and diagnosed with Covid.   They started me on Paxlovid.   I'm vomiting and can't keep anything down today.   Is it from the [facility]? 2. ONSET: "When was the med started?" "When did the vomiting start?"     Monday    Vomiting started today. 3. VOMITING: "How many times?" "How soon after taking the medicine?" (minutes, hours)     I can't keep anything down today.    I don't have a PCP when I asked him.   I let him know he may need to return to the ED because I could not tell him if the vomiting was from Paxlovid or something else by talking with him  over the phone.   He didn't want to go back to the ED.  I suggested he call the pharmacist at the [facility] where he got the Paxlovid and see if vomiting is a side effect of Paxlovid.   He was agreeable to this plan. 4. GIVING THE MEDICINE: "Is it easy or hard to give the medicine?" If it's hard, ask: "What does your child do?" "What do you have to do?"     N/A 5. SYMPTOMS: "Any other symptoms?" If so, ask: "What are they (e.g., diarrhea)?"     Vomiting that started today.   Did have some diarrhea but not now. 6. CHILD'S APPEARANCE: "How sick is your child acting?" " What is he doing right now?" If asleep, ask: "How was he acting before he went to sleep?"     N/A  Protocols used: Medication Question Call-A-AH, Vomiting on Meds-P-AH

## 2023-03-20 NOTE — Telephone Encounter (Signed)
  Chief Complaint: Vomiting 3 days after starting Paxlovid. Symptoms: vomting Frequency: just today Pertinent Negatives: Patient denies N/A Disposition: [] ED /[] Urgent Care (no appt availability in office) / [] Appointment(In office/virtual)/ []  [facility]/ [x] Home Care/ [] Refused Recommended Disposition /[] [facility]/ []  Follow-up with PCP Additional Notes: I instructed him to f/u with pharmacist. Which he was willing to do.

## 2023-05-14 ENCOUNTER — Other Ambulatory Visit: Payer: Self-pay

## 2023-05-14 ENCOUNTER — Encounter: Payer: Self-pay | Admitting: Emergency Medicine

## 2023-05-14 ENCOUNTER — Ambulatory Visit
Admission: EM | Admit: 2023-05-14 | Discharge: 2023-05-14 | Disposition: A | Discharge status: Home / Self Care | Payer: Medicaid Other | Attending: Nurse Practitioner | Admitting: Nurse Practitioner

## 2023-05-14 DIAGNOSIS — R42 Dizziness and giddiness: Secondary | ICD-10-CM | POA: Diagnosis not present

## 2023-05-14 LAB — POCT FASTING CBG KUC MANUAL ENTRY: POCT Glucose (KUC): 153 mg/dL — AB (ref 70–99)

## 2023-05-14 MED ORDER — MECLIZINE HCL 12.5 MG PO TABS: 12.5000 mg | ORAL_TABLET | Freq: Three times a day (TID) | ORAL | 0 refills | Status: DC | PRN

## 2023-05-14 NOTE — ED Notes (Signed)
Per NP, pt stable to return to waiting room and wait for provider assessment. Pt aware and returned to waiting room.

## 2023-05-14 NOTE — ED Provider Notes (Signed)
[facility]    CSN: [id number] Arrival date & time: 05/14/23  1243      History   Chief Complaint Chief Complaint  Patient presents with   Dizziness    HPI [name] is a 42 y.o. male.   Patient presents today with 1 day history of dizziness that began approximately 2 hours ago.  Reports he was at work and symptoms began.  Reports he feels "dizzy headed" and there is a little bit of room spinning sensation.  Reports the dizziness comes and goes in waves and lasts about 20 minutes.  Dizziness is worse with head movement.  Also endorses nausea and headache when he has the dizziness.  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  Patient denies history of similar, symptoms worse with loud noises or coughing, vomiting, tinnitus, hearing loss, photophobia, unsteady gait, postural instability, falls, trouble with speech, trouble swallowing, weakness, chest pain, diaphoresis, and shortness of breath associated with the dizziness.  Denies feeling anxious recently.  Reports he has been drinking a lot of coffee lately.     Past Medical History:  Diagnosis Date   ADHD (attention deficit hyperactivity disorder)    Asthma     Patient Active Problem List   Diagnosis Date Noted   Adjustment disorder with mixed anxiety and depressed mood 04/06/2016   Attention deficit hyperactivity disorder (ADHD) 04/06/2016   Suicide ideation 04/04/2016    Past Surgical History:  Procedure Laterality Date   head surgery (as a baby)         Home Medications    Prior to Admission medications   Medication Sig Start Date End Date Taking? Authorizing Provider  meclizine (ANTIVERT) 12.5 MG tablet Take 1 tablet (12.5 mg total) by mouth 3 (three) times daily as needed for dizziness. Do not take with alcohol or while driving or operating heavy machinery.  May cause drowsiness. 05/14/23  Yes Valentino Nose, NP     Family History Family History  Problem Relation Age of Onset   Healthy Mother    Healthy Father     Social History Social History   Tobacco Use   Smoking status: Every Day    Packs/day: .5    Types: Cigarettes   Smokeless tobacco: Never  Vaping Use   Vaping Use: Never used  Substance Use Topics   Alcohol use: Yes    Comment: occasional   Drug use: Not Currently    Types: Cocaine    Comment: 2016     Allergies   Bee venom   Review of Systems Review of Systems Per HPI  Physical Exam Triage Vital Signs ED Triage Vitals  Enc Vitals Group     BP 05/14/23 1305 125/75     Pulse Rate 05/14/23 1305 70     Resp 05/14/23 1305 20     Temp 05/14/23 1305 98.3 F (36.8 C)     Temp Source 05/14/23 1305 Oral     SpO2 05/14/23 1305 96 %     Weight --      Height --      Head Circumference --      Peak Flow --      Pain Score 05/14/23 1301 2     Pain Loc --      Pain Edu? --      Excl. in GC? --    Orthostatic VS for the past 24 hrs:  BP- Lying Pulse- Lying BP- Sitting Pulse- Sitting BP- Standing at 0 minutes Pulse- Standing at 0 minutes  05/14/23 1316 131/80 63 139/89 69 143/89 86    Updated Vital Signs BP 125/75 (BP Location: Right Arm)   Pulse 70   Temp 98.3 F (36.8 C) (Oral)   Resp 20   SpO2 96%   Visual Acuity Right Eye Distance:   Left Eye Distance:   Bilateral Distance:    Right Eye Near:   Left Eye Near:    Bilateral Near:     Physical Exam Vitals and nursing note reviewed.  Constitutional:      General: He is not in acute distress.    Appearance: Normal appearance. He is not ill-appearing, toxic-appearing or diaphoretic.  HENT:     Head: Normocephalic and atraumatic.     Right Ear: Tympanic membrane, ear canal and external ear normal. There is no impacted cerumen.     Left Ear: Tympanic membrane, ear canal and external ear normal.     Nose: Nose normal. No congestion or rhinorrhea.     Mouth/Throat:     Mouth: Mucous membranes are  moist.     Pharynx: Oropharynx is clear. No posterior oropharyngeal erythema.  Eyes:     General: No scleral icterus.    Pupils: Pupils are equal, round, and reactive to light.     Comments: Eyes are not aligned at baseline; left eye has lateral gaze  Cardiovascular:     Rate and Rhythm: Normal rate and regular rhythm.  Pulmonary:     Effort: Pulmonary effort is normal. No respiratory distress.     Breath sounds: Normal breath sounds. No wheezing, rhonchi or rales.  Abdominal:     General: Abdomen is flat. Bowel sounds are normal. There is no distension.     Palpations: Abdomen is soft.     Tenderness: There is no abdominal tenderness. There is no right CVA tenderness, left CVA tenderness or guarding.  Musculoskeletal:     Cervical back: Normal range of motion and neck supple. No rigidity or tenderness.  Lymphadenopathy:     Cervical: No cervical adenopathy.  Skin:    General: Skin is warm and dry.     Capillary Refill: Capillary refill takes less than 2 seconds.     Coloration: Skin is not jaundiced or pale.     Findings: No erythema.  Neurological:     General: No focal deficit present.     Mental Status: He is alert and oriented to person, place, and time.     Cranial Nerves: Cranial nerves 2-12 are intact.     Sensory: Sensation is intact.     Motor: No weakness.     Coordination: Coordination is intact. Coordination normal. Heel to [facility] Test normal. Rapid alternating movements normal.     Gait: Gait is intact. Gait normal.  Psychiatric:        Mood and Affect: Mood is anxious.        Speech: Speech is rapid and pressured.        Behavior: Behavior is cooperative.      UC Treatments / Results  Labs (all labs ordered are listed, but only abnormal results are displayed) Labs Reviewed  COMPREHENSIVE METABOLIC PANEL - Abnormal; Notable for the following components:      Result Value   BUN/Creatinine Ratio 22 (*)    Albumin/Globulin Ratio 3.1 (*)    All other components  within normal limits   Narrative:  Performed at:  [address], [place], [place]  [id number] Lab Director: Jolene Schimke MD, Phone:  [phone]  POCT FASTING CBG [facility] MANUAL ENTRY - Abnormal; Notable for the following components:   POCT Glucose ([facility]) 153 (*)    All other components within normal limits  CBC WITH DIFFERENTIAL/PLATELET   Narrative:    Performed at:  [address] [facility] [address], [place], [place]  [id number] Lab Director: Jolene Schimke MD, Phone:  [phone]  TSH   Narrative:    Performed at:  [address]  [address] [place], [place]  [id number] Lab Director: Jolene Schimke MD, Phone:  [phone]    EKG   Radiology No results found.  Procedures Procedures (including critical care time)  Medications Ordered in UC Medications - No data to display  Initial Impression / Assessment and Plan / UC Course  I have reviewed the triage vital signs and the nursing notes.  Pertinent labs & imaging results that were available during my care of the patient were reviewed by me and considered in my medical decision making (see chart for details).   Patient is well-appearing, normotensive, afebrile, not tachycardic, not tachypneic, oxygenating well on room air.    1. Dizziness EKG today is stable when compared with previous EKG Orthostatic vital signs are negative for orthostasis Vital signs are stable Examination is reassuring Blood sugar is not low CBC, electrolytes, kidney function, liver function, and thyroid hormone obtained today to assess for metabolic cause Suspect possible vertigo versus excessive caffeine intake Antivert prescribed Seek care for persistent or worsening symptoms despite treatment Strict ER precautions discussed with patient Note given for work  The patient was given the opportunity to ask questions.  All questions answered to their satisfaction.  The patient is in agreement to this plan.     Final Clinical Impressions(s) / UC Diagnoses   Final diagnoses:  Dizziness     Discharge Instructions      I am unsure what is causing the dizziness today.  Vital signs today look great, EKG is stable, and blood sugar is not low.  We are checking blood work and will call you if it is abnormal later this week.  If the dizziness is caused by vertigo, I have sent a prescription to the pharmacy called meclizine that will help with it.  If the meclizine does not help, this means you likely do not have vertigo.  Please increase water intake and follow-up with PCP if symptoms persist or worsen despite treatment.     ED Prescriptions     Medication Sig Dispense Auth. Provider   meclizine (ANTIVERT) 12.5 MG tablet Take 1 tablet (12.5 mg total) by mouth 3 (three) times daily as needed for dizziness. Do not take with alcohol or while driving or operating heavy machinery.  May cause drowsiness. 30 tablet Valentino Nose, NP      PDMP not reviewed this encounter.   Valentino Nose, NP 05/15/23 [phone]

## 2023-05-14 NOTE — ED Triage Notes (Signed)
Pt reports sudden onset of dizziness approx 10am this am. Pt reports intermittent nausea/fatigue. Ate lunch with no change in symptoms. Pt reports has tried po fluids with no change.   Pt reports light sensitivity,blurred vision, right hand tingling.   Speech clear. Equal grip. Sensation equal and intact in face and extremities.gait steady.  Reports works in hot environment and decreased oral intake today.

## 2023-05-14 NOTE — Discharge Instructions (Signed)
I am unsure what is causing the dizziness today.  Vital signs today look great, EKG is stable, and blood sugar is not low.  We are checking blood work and will call you if it is abnormal later this week.  If the dizziness is caused by vertigo, I have sent a prescription to the pharmacy called meclizine that will help with it.  If the meclizine does not help, this means you likely do not have vertigo.  Please increase water intake and follow-up with PCP if symptoms persist or worsen despite treatment.

## 2023-05-15 LAB — COMPREHENSIVE METABOLIC PANEL
ALT: 25 IU/L (ref 0–44)
AST: 16 IU/L (ref 0–40)
Albumin/Globulin Ratio: 3.1 — ABNORMAL HIGH (ref 1.2–2.2)
Albumin: 4.7 g/dL (ref 4.1–5.1)
Alkaline Phosphatase: 116 IU/L (ref 44–121)
BUN/Creatinine Ratio: 22 — ABNORMAL HIGH (ref 9–20)
BUN: 20 mg/dL (ref 6–24)
Bilirubin Total: 0.9 mg/dL (ref 0.0–1.2)
CO2: 22 mmol/L (ref 20–29)
Calcium: 9.3 mg/dL (ref 8.7–10.2)
Chloride: 104 mmol/L (ref 96–106)
Creatinine, Ser: 0.89 mg/dL (ref 0.76–1.27)
Globulin, Total: 1.5 g/dL (ref 1.5–4.5)
Glucose: 91 mg/dL (ref 70–99)
Potassium: 4.2 mmol/L (ref 3.5–5.2)
Sodium: 142 mmol/L (ref 134–144)
Total Protein: 6.2 g/dL (ref 6.0–8.5)
eGFR: 111 mL/min/{1.73_m2} (ref >59)

## 2023-05-15 LAB — CBC WITH DIFFERENTIAL/PLATELET
Basophils Absolute: 0 10*3/uL (ref 0.0–0.2)
Basos: 1 %
EOS (ABSOLUTE): 0.1 10*3/uL (ref 0.0–0.4)
Eos: 2 %
Hematocrit: 44.8 % (ref 37.5–51.0)
Hemoglobin: 15.2 g/dL (ref 13.0–17.7)
Immature Grans (Abs): 0 10*3/uL (ref 0.0–0.1)
Immature Granulocytes: 1 %
Lymphocytes Absolute: 2.1 10*3/uL (ref 0.7–3.1)
Lymphs: 32 %
MCH: 28.6 pg (ref 26.6–33.0)
MCHC: 33.9 g/dL (ref 31.5–35.7)
MCV: 84 fL (ref 79–97)
Monocytes Absolute: 0.5 10*3/uL (ref 0.1–0.9)
Monocytes: 8 %
Neutrophils Absolute: 3.7 10*3/uL (ref 1.4–7.0)
Neutrophils: 56 %
Platelets: 196 10*3/uL (ref 150–450)
RBC: 5.31 x10E6/uL (ref 4.14–5.80)
RDW: 13.1 % (ref 11.6–15.4)
WBC: 6.6 10*3/uL (ref 3.4–10.8)

## 2023-05-15 LAB — TSH: TSH: 1.26 u[IU]/mL (ref 0.450–4.500)

## 2023-05-27 ENCOUNTER — Emergency Department (HOSPITAL_COMMUNITY)
Admission: EM | Admit: 2023-05-27 | Discharge: 2023-05-27 | Disposition: A | Discharge status: Home / Self Care | Payer: Medicaid Other | Attending: Emergency Medicine | Admitting: Emergency Medicine

## 2023-05-27 ENCOUNTER — Other Ambulatory Visit: Payer: Self-pay

## 2023-05-27 DIAGNOSIS — K644 Residual hemorrhoidal skin tags: Secondary | ICD-10-CM

## 2023-05-27 DIAGNOSIS — K648 Other hemorrhoids: Secondary | ICD-10-CM | POA: Diagnosis not present

## 2023-05-27 DIAGNOSIS — K6289 Other specified diseases of anus and rectum: Secondary | ICD-10-CM | POA: Diagnosis present

## 2023-05-27 MED ORDER — NIFEDIPINE 0.3 % OINTMENT: 1.0000 | TOPICAL_OINTMENT | Freq: Four times a day (QID) | CUTANEOUS | 0 refills | Status: AC

## 2023-05-27 MED ORDER — POLYETHYLENE GLYCOL 3350 17 G PO PACK: 17.0000 g | PACK | Freq: Every day | ORAL | 0 refills | Status: AC

## 2023-05-27 NOTE — Discharge Instructions (Addendum)
Thank you for letting us take care of you today.  You have an external hemorrhoid that is prolapsed. We treat this with nifedipine cream and also recommend taking Miralax to soften your bowel movements. Do sitz baths at home which are explained in instructions provided. You may take over the counter Tylenol or ibuprofen as needed for pain.  If you continue to have issues with your hemorrhoid, I recommend following up with surgery or primary care, both of which I have provided. I was unable to find costs/coupons for the nifedipine cream. Check with the pharmacy to see if it is covered by your insurance and what the cost will be. If it is too expensive I recommend asking pharmacist if there are GoodRx coupons or other ways to assist with payment through the pharmacy.  For new or worsening symptoms, return to nearest ED for re-evaluation.

## 2023-05-27 NOTE — ED Triage Notes (Signed)
Pt states he has had hemorrhoid discomfort since Saturday.  Unable to sleep.  States he has tried preparation H with no relief.  Hurts to walk.

## 2023-05-27 NOTE — ED Provider Notes (Signed)
[name] Provider Note   CSN: [id number] Arrival date & time: 05/27/23  1123     History  Chief Complaint  Patient presents with   Hemorrhoids    [name] is a 42 y.o. male who presents to the ED complaining of rectal pain secondary to hemorrhoids.  States that they have been there for at least 10 years but usually he is able to control his symptoms at home.  He has tried using Preparation H and a laxative with some relief but symptoms have not resolved.  He denies associated abdominal pain, rectal bleeding, constipation, nausea, vomiting, or fever.  Last bowel movement was last night.  He does have increased pain with bowel movements.      Home Medications No daily medications  Allergies    Bee venom    Review of Systems   Review of Systems  All other systems reviewed and are negative.   Physical Exam Updated Vital Signs BP (!) 155/92 (BP Location: Right Arm)   Pulse (!) 58   Temp 98.4 F (36.9 C) (Oral)   Resp 18   Ht 5\' 11"  (1.803 m)   Wt 84.8 kg   SpO2 95%   BMI 26.08 kg/m  Physical Exam Vitals and nursing note reviewed. Exam conducted with a chaperone present.  Constitutional:      General: He is not in acute distress.    Appearance: Normal appearance.  HENT:     Head: Normocephalic and atraumatic.     Mouth/Throat:     Mouth: Mucous membranes are moist.  Eyes:     Conjunctiva/sclera: Conjunctivae normal.  Cardiovascular:     Rate and Rhythm: Normal rate and regular rhythm.     Heart sounds: No murmur heard. Pulmonary:     Effort: Pulmonary effort is normal.     Breath sounds: Normal breath sounds.  Abdominal:     General: Abdomen is flat. There is no distension.     Palpations: Abdomen is soft.     Tenderness: There is no abdominal tenderness. There is no right CVA tenderness, left CVA tenderness, guarding or rebound.  Genitourinary:    Pubic Area: No rash.      Penis: Normal.      Testes:  Normal.     Rectum: External hemorrhoid (prolapsed, nonthrombosed, tender to palpation) present. No anal fissure. Normal anal tone.     Comments: Performed with Sue Lush, primary RN, as chaperone Musculoskeletal:        General: Normal range of motion.     Cervical back: Neck supple.     Right lower leg: No edema.     Left lower leg: No edema.  Skin:    General: Skin is warm and dry.     Capillary Refill: Capillary refill takes less than 2 seconds.  Neurological:     Mental Status: He is alert. Mental status is at baseline.  Psychiatric:        Behavior: Behavior normal.     ED Results / Procedures / Treatments   Labs (all labs ordered are listed, but only abnormal results are displayed) Labs Reviewed - No data to display  EKG None  Radiology No results found.  Procedures Procedures    Medications Ordered in ED Medications - No data to display  ED Course/ Medical Decision Making/ A&P  Medical Decision Making  Medical Decision Making:   [name] is a 42 y.o. male who presented to the ED today with rectal pain detailed above.     Complete initial physical exam performed, notably the patient was in NAD. He had prolapsed, nonthrombosed external hemorrhoid. No anal fissure. No rectal bleeding. HD stable. Abdomen soft and nontender. Will treat symptomatically and refer to surgery for needed additional management.    Reviewed and confirmed nursing documentation for past medical history, family history, social history.    Initial Assessment:   With the patient's presentation, differential diagnosis includes but is not limited to internal hemorrhoid, external hemorrhoid, anal fissure, GI bleed, anal trauma, constipation, cellulitis.  This is most consistent with an acute complicated illness  Initial Plan:  Symptomatic management Objective evaluation as below reviewed   Final Assessment and Plan:   42 year old male presenting to ED for  rectal pain. He had prolapsed, nonthrombosed external hemorrhoid. No anal fissure. No rectal bleeding. HD stable. Abdomen soft and nontender. Will treat symptomatically and refer to surgery for needed additional management.  Educated on importance of sitz baths, nifedipine cream provided. Pt aware of importance of follow up though. Strict ED return precautions given, all questions answered, and stable for discharge.    Clinical Impression:  1. External prolapsed hemorrhoids      Discharge           Final Clinical Impression(s) / ED Diagnoses Final diagnoses:  External prolapsed hemorrhoids    Rx / DC Orders ED Discharge Orders          Ordered    nifedipine 0.3 % ointment  4 times daily        05/27/23 1241    polyethylene glycol (MIRALAX) 17 g packet  Daily        05/27/23 1243              Tonette Lederer, PA-C 05/27/23 1827    Pricilla Loveless, MD 05/28/23 [phone]

## 2023-05-28 ENCOUNTER — Telehealth: Payer: Self-pay

## 2023-05-28 NOTE — Transitions of Care (Post Inpatient/ED Visit) (Signed)
   05/28/2023  Name: [name] MRN: [id number] DOB: 1981-02-16  Today's TOC FU Call Status: Today's TOC FU Call Status:: Unsuccessul Call (1st Attempt) Unsuccessful Call (1st Attempt) Date: 05/28/23  Attempted to reach the patient regarding the most recent Inpatient/ED visit.  Follow Up Plan: Additional outreach attempts will be made to reach the patient to complete the Transitions of Care (Post Inpatient/ED visit) call.   Abelino Derrick, MHA [facility]  Managed [facility] Social Worker [phone]

## 2023-05-29 ENCOUNTER — Telehealth: Payer: Self-pay

## 2023-05-29 NOTE — Transitions of Care (Post Inpatient/ED Visit) (Signed)
   05/29/2023  Name: [name] MRN: [id number] DOB: Aug 26, 1981  Today's TOC FU Call Status: Today's TOC FU Call Status:: Unsuccessful Call (2nd Attempt) Unsuccessful Call (2nd Attempt) Date: 05/29/23  Attempted to reach the patient regarding the most recent Inpatient/ED visit.  Follow Up Plan: Additional outreach attempts will be made to reach the patient to complete the Transitions of Care (Post Inpatient/ED visit) call.   Abelino Derrick, MHA [facility]  Managed [facility] Social Worker [phone]

## 2023-05-30 ENCOUNTER — Telehealth: Payer: Self-pay

## 2023-05-30 NOTE — Transitions of Care (Post Inpatient/ED Visit) (Signed)
   05/30/2023  Name: [name] MRN: [id number] DOB: 03-15-1981  Today's TOC FU Call Status: Today's TOC FU Call Status:: Unsuccessful Call (3rd Attempt) Unsuccessful Call (3rd Attempt) Date: 05/30/23  Attempted to reach the patient regarding the most recent Inpatient/ED visit.  Follow Up Plan: No further outreach attempts will be made at this time. We have been unable to contact the patient.  Abelino Derrick, MHA [facility]  Managed [facility] Social Worker [phone]

## 2023-06-01 ENCOUNTER — Encounter (HOSPITAL_COMMUNITY): Payer: Self-pay

## 2023-06-01 ENCOUNTER — Other Ambulatory Visit: Payer: Self-pay

## 2023-06-01 ENCOUNTER — Emergency Department (HOSPITAL_COMMUNITY)
Admission: EM | Admit: 2023-06-01 | Discharge: 2023-06-01 | Disposition: A | Discharge status: Home / Self Care | Payer: Medicaid Other | Attending: Emergency Medicine | Admitting: Emergency Medicine

## 2023-06-01 DIAGNOSIS — J45909 Unspecified asthma, uncomplicated: Secondary | ICD-10-CM | POA: Insufficient documentation

## 2023-06-01 DIAGNOSIS — Z20822 Contact with and (suspected) exposure to covid-19: Secondary | ICD-10-CM | POA: Diagnosis not present

## 2023-06-01 DIAGNOSIS — R0981 Nasal congestion: Secondary | ICD-10-CM | POA: Diagnosis present

## 2023-06-01 DIAGNOSIS — K649 Unspecified hemorrhoids: Secondary | ICD-10-CM | POA: Diagnosis not present

## 2023-06-01 DIAGNOSIS — J069 Acute upper respiratory infection, unspecified: Secondary | ICD-10-CM | POA: Diagnosis not present

## 2023-06-01 DIAGNOSIS — B9789 Other viral agents as the cause of diseases classified elsewhere: Secondary | ICD-10-CM | POA: Diagnosis not present

## 2023-06-01 LAB — SARS CORONAVIRUS 2 BY RT PCR: SARS Coronavirus 2 by RT PCR: NEGATIVE

## 2023-06-01 MED ORDER — OXYMETAZOLINE HCL 0.05 % NA SOLN
1.0000 | Freq: Two times a day (BID) | NASAL | Status: DC | PRN
  Administered 2023-06-01: 1 via NASAL
  Filled 2023-06-01: qty 30

## 2023-06-01 MED ORDER — HYDROCORTISONE (PERIANAL) 2.5 % EX CREA: 1.0000 | TOPICAL_CREAM | Freq: Two times a day (BID) | CUTANEOUS | 0 refills | Status: DC

## 2023-06-01 NOTE — ED Provider Notes (Signed)
[place] EMERGENCY DEPARTMENT AT [facility] Provider Note   CSN: [id number] Arrival date & time: 06/01/23  4098     History  Chief Complaint  Patient presents with   Nasal Congestion    [name] is a 42 y.o. male.  Pt is a 42 yo male with pmhx significant for asthma and adhd.  Pt has been having trouble with his hemorrhoids and was not able to afford the nifedipine crm he was rx'd when he was here on 6/10.  So, those are still bothering him.  He is mainly here because he woke up this am with his sinuses congested.  He denies any fever.  No known sick contacts.  He wants a work note.  He is supposed to see surgery next week, but does not think he can go as he said he can't afford it (although he has medicaid).        Home Medications Prior to Admission medications   Medication Sig Start Date End Date Taking? Authorizing Provider  hydrocortisone (ANUSOL-HC) 2.5 % rectal cream Place 1 Application rectally 2 (two) times daily. 06/01/23  Yes Jacalyn Lefevre, MD  meclizine (ANTIVERT) 12.5 MG tablet Take 1 tablet (12.5 mg total) by mouth 3 (three) times daily as needed for dizziness. Do not take with alcohol or while driving or operating heavy machinery.  May cause drowsiness. 05/14/23   Valentino Nose, NP  nifedipine 0.3 % ointment Place 1 Application rectally 4 (four) times daily for 7 days. 05/27/23 06/03/23  Gowens, Mariah L, PA-C  polyethylene glycol (MIRALAX) 17 g packet Take 17 g by mouth daily for 5 days. 05/27/23 06/01/23  Gowens, Lawrence [place], PA-C      Allergies    Bee venom    Review of Systems   Review of Systems  HENT:  Positive for congestion.   Gastrointestinal:        Hemorroids  All other systems reviewed and are negative.   Physical Exam Updated Vital Signs BP (!) 131/92   Pulse 64   Temp 98 F (36.7 C) (Oral)   Resp 18   SpO2 97%  Physical Exam Vitals and nursing note reviewed.  Constitutional:      Appearance: Normal appearance.   HENT:     Head: Normocephalic and atraumatic.     Right Ear: External ear normal.     Left Ear: External ear normal.     Nose: Nose normal.     Mouth/Throat:     Mouth: Mucous membranes are moist.     Pharynx: Oropharynx is clear.  Eyes:     Extraocular Movements: Extraocular movements intact.     Conjunctiva/sclera: Conjunctivae normal.     Pupils: Pupils are equal, round, and reactive to light.  Cardiovascular:     Rate and Rhythm: Normal rate and regular rhythm.     Pulses: Normal pulses.     Heart sounds: Normal heart sounds.  Pulmonary:     Effort: Pulmonary effort is normal.     Breath sounds: Normal breath sounds.  Abdominal:     General: Abdomen is flat. Bowel sounds are normal.     Palpations: Abdomen is soft.  Musculoskeletal:        General: Normal range of motion.     Cervical back: Normal range of motion and neck supple.  Skin:    General: Skin is warm.     Capillary Refill: Capillary refill takes less than 2 seconds.  Neurological:  General: No focal deficit present.     Mental Status: He is alert and oriented to person, place, and time.  Psychiatric:        Mood and Affect: Mood normal.        Behavior: Behavior normal.     ED Results / Procedures / Treatments   Labs (all labs ordered are listed, but only abnormal results are displayed) Labs Reviewed  SARS CORONAVIRUS 2 BY RT PCR    EKG None  Radiology No results found.  Procedures Procedures    Medications Ordered in ED Medications  oxymetazoline (AFRIN) 0.05 % nasal spray 1 spray (1 spray Each Nare Given 06/01/23 0859)    ED Course/ Medical Decision Making/ A&P                              I strongly suspect pt is only here because he wants a note for work.  He is given afrin.  Covid is neg.  He is stable for d/c.  Return if worse.       Final Clinical Impression(s) / ED Diagnoses Final diagnoses:  Viral upper respiratory tract infection  Hemorrhoids, unspecified  hemorrhoid type    Rx / DC Orders ED Discharge Orders          Ordered    hydrocortisone (ANUSOL-HC) 2.5 % rectal cream  2 times daily        06/01/23 8756              Jacalyn Lefevre, MD 06/01/23 [phone]

## 2023-06-01 NOTE — ED Triage Notes (Signed)
Pt state he woke up having nasal congestion.

## 2023-06-03 ENCOUNTER — Telehealth: Payer: Self-pay | Admitting: *Deleted

## 2023-06-03 NOTE — Transitions of Care (Post Inpatient/ED Visit) (Signed)
   06/03/2023  Name: [name] MRN: [id number] DOB: [birth year]  Today's TOC FU Call Status: Today's TOC FU Call Status:: Successful TOC FU Call Competed TOC FU Call Complete Date: 06/03/23  Transition Care Management Follow-up Telephone Call Date of Discharge: 06/01/23 [facility] Type of Discharge: Emergency Department Reason for ED Visit: Other: (nasal congestion) How have you been since you were released from the hospital?: Better Any questions or concerns?: No  Items Reviewed: Did you receive and understand the discharge instructions provided?: Yes Dietary orders reviewed?: NA Do you have support at home?: Yes People in Home: friend(s) Name of Support/Comfort Primary Source: Roommate/Tynisha Thomas  Medications Reviewed Today: Medications Reviewed Today     Reviewed by Heidi Dach, RN (Registered Nurse) on 06/03/23 at 1610  Med List Status: <None>   Medication Order Taking? Sig Documenting Provider Last Dose Status Informant  hydrocortisone (ANUSOL-HC) 2.5 % rectal cream [id number] Yes Place 1 Application rectally 2 (two) times daily. Jacalyn Lefevre, MD Taking Active   meclizine (ANTIVERT) 12.5 MG tablet [id number] No Take 1 tablet (12.5 mg total) by mouth 3 (three) times daily as needed for dizziness. Do not take with alcohol or while driving or operating heavy machinery.  May cause drowsiness.  Patient not taking: Reported on 06/03/2023   Valentino Nose, NP Not Taking Active   nifedipine 0.3 % ointment [id number] No Place 1 Application rectally 4 (four) times daily for 7 days.  Patient not taking: Reported on 06/03/2023   Tonette Lederer, PA-C Not Taking Active             Home Care and Equipment/Supplies: Were Home Health Services Ordered?: NA Any new equipment or medical supplies ordered?: NA  Functional Questionnaire: Do you need assistance with bathing/showering or dressing?: No Do you need assistance with meal preparation?:  No Do you need assistance with eating?: No Do you have difficulty maintaining continence: No Do you need assistance with getting out of bed/getting out of a chair/moving?: No Do you have difficulty managing or taking your medications?: No  Follow up appointments reviewed: PCP Follow-up appointment confirmed?: No MD Provider Line [phone] Given: Yes Specialist Hospital Follow-up appointment confirmed?: Yes Date of Specialist follow-up appointment?: 06/04/23 Follow-Up Specialty Provider:: [facility] Do you need transportation to your follow-up appointment?: No Do you understand care options if your condition(s) worsen?: Yes-patient verbalized understanding  SDOH Interventions Today    Flowsheet Row Most Recent Value  SDOH Interventions   Transportation Interventions Payor Benefit      The patient was given information about care management services as a benefit of their Medicaid health plan today.   Patient                                               wishes to consider information provided and/or speak with a member of the care team before deciding about enrollment in care management services.    Estanislado Emms RN, BSN [place]  Managed [facility] RN Care Coordinator [phone]

## 2023-06-04 ENCOUNTER — Ambulatory Visit: Payer: Medicaid Other | Admitting: General Surgery

## 2023-06-25 ENCOUNTER — Ambulatory Visit: Payer: Medicaid Other | Admitting: General Surgery

## 2023-08-02 ENCOUNTER — Telehealth: Payer: Self-pay

## 2023-08-02 NOTE — Telephone Encounter (Signed)
Not able to complete call - last visit to ED was 06/01/2023 Pt needs help to establish a PCP

## 2023-09-03 ENCOUNTER — Other Ambulatory Visit: Payer: Self-pay

## 2023-09-03 ENCOUNTER — Encounter (HOSPITAL_COMMUNITY): Payer: Self-pay

## 2023-09-03 ENCOUNTER — Emergency Department (HOSPITAL_COMMUNITY)
Admission: EM | Admit: 2023-09-03 | Discharge: 2023-09-03 | Disposition: A | Discharge status: Home / Self Care | Payer: Medicaid Other | Attending: Emergency Medicine | Admitting: Emergency Medicine

## 2023-09-03 DIAGNOSIS — R6883 Chills (without fever): Secondary | ICD-10-CM | POA: Insufficient documentation

## 2023-09-03 DIAGNOSIS — Z20822 Contact with and (suspected) exposure to covid-19: Secondary | ICD-10-CM | POA: Diagnosis not present

## 2023-09-03 DIAGNOSIS — R0981 Nasal congestion: Secondary | ICD-10-CM | POA: Diagnosis not present

## 2023-09-03 DIAGNOSIS — R1084 Generalized abdominal pain: Secondary | ICD-10-CM | POA: Insufficient documentation

## 2023-09-03 DIAGNOSIS — R519 Headache, unspecified: Secondary | ICD-10-CM | POA: Diagnosis not present

## 2023-09-03 LAB — URINALYSIS, ROUTINE W REFLEX MICROSCOPIC
Bilirubin Urine: NEGATIVE
Glucose, UA: NEGATIVE mg/dL
Hgb urine dipstick: NEGATIVE
Ketones, ur: NEGATIVE mg/dL
Leukocytes,Ua: NEGATIVE
Nitrite: NEGATIVE
Protein, ur: NEGATIVE mg/dL
Specific Gravity, Urine: 1.006 (ref 1.005–1.030)
pH: 7 (ref 5.0–8.0)

## 2023-09-03 LAB — CBC WITH DIFFERENTIAL/PLATELET
Abs Immature Granulocytes: 0.04 10*3/uL (ref 0.00–0.07)
Basophils Absolute: 0 10*3/uL (ref 0.0–0.1)
Basophils Relative: 1 %
Eosinophils Absolute: 0.2 10*3/uL (ref 0.0–0.5)
Eosinophils Relative: 3 %
HCT: 47 % (ref 39.0–52.0)
Hemoglobin: 15.8 g/dL (ref 13.0–17.0)
Immature Granulocytes: 1 %
Lymphocytes Relative: 32 %
Lymphs Abs: 1.9 10*3/uL (ref 0.7–4.0)
MCH: 29.3 pg (ref 26.0–34.0)
MCHC: 33.6 g/dL (ref 30.0–36.0)
MCV: 87 fL (ref 80.0–100.0)
Monocytes Absolute: 0.5 10*3/uL (ref 0.1–1.0)
Monocytes Relative: 8 %
Neutro Abs: 3.3 10*3/uL (ref 1.7–7.7)
Neutrophils Relative %: 55 %
Platelets: 297 10*3/uL (ref 150–400)
RBC: 5.4 MIL/uL (ref 4.22–5.81)
RDW: 12.7 % (ref 11.5–15.5)
WBC: 6 10*3/uL (ref 4.0–10.5)
nRBC: 0 % (ref 0.0–0.2)

## 2023-09-03 LAB — LIPASE, BLOOD: Lipase: 33 U/L (ref 11–51)

## 2023-09-03 LAB — COMPREHENSIVE METABOLIC PANEL
ALT: 29 U/L (ref 0–44)
AST: 18 U/L (ref 15–41)
Albumin: 4.5 g/dL (ref 3.5–5.0)
Alkaline Phosphatase: 95 U/L (ref 38–126)
Anion gap: 9 (ref 5–15)
BUN: 12 mg/dL (ref 6–20)
CO2: 27 mmol/L (ref 22–32)
Calcium: 9 mg/dL (ref 8.9–10.3)
Chloride: 104 mmol/L (ref 98–111)
Creatinine, Ser: 0.88 mg/dL (ref 0.61–1.24)
GFR, Estimated: >60 mL/min (ref >60)
Glucose, Bld: 88 mg/dL (ref 70–99)
Potassium: 3.9 mmol/L (ref 3.5–5.1)
Sodium: 140 mmol/L (ref 135–145)
Total Bilirubin: 1.5 mg/dL — ABNORMAL HIGH (ref 0.3–1.2)
Total Protein: 7 g/dL (ref 6.5–8.1)

## 2023-09-03 LAB — SARS CORONAVIRUS 2 BY RT PCR: SARS Coronavirus 2 by RT PCR: NEGATIVE

## 2023-09-03 MED ORDER — ONDANSETRON HCL 4 MG/2ML IJ SOLN
4.0000 mg | Freq: Once | INTRAMUSCULAR | Status: AC
  Administered 2023-09-03: 4 mg via INTRAVENOUS
  Filled 2023-09-03: qty 2

## 2023-09-03 MED ORDER — MORPHINE SULFATE (PF) 4 MG/ML IV SOLN
4.0000 mg | Freq: Once | INTRAVENOUS | Status: AC
  Administered 2023-09-03: 4 mg via INTRAVENOUS
  Filled 2023-09-03: qty 1

## 2023-09-03 NOTE — Discharge Instructions (Signed)
It was a pleasure taking care of you today.  You were seen for abdominal pain, chills and headache.  COVID test is normal, your blood work was overall reassuring.  We offered a CT scan, since you are hungry would for to go home please follow-up closely with your primary care doctor but if you have new or worsening symptoms please come back to the ER right away.  [facility] Primary Care Doctor List    Syliva Overman, MD. Specialty: [facility] Contact information: [address]  [place] [address]  [phone]   Lilyan Punt, MD. Specialty: [facility] Contact information: [address]  [place] [address]  [phone]   Avon Gully, MD Specialty: Internal Medicine Contact information: [address] [place] [address]  [phone]   Catalina Pizza, MD. Specialty: Internal Medicine Contact information: [name]  [place] [address]  [phone]    [facility] (Dr. Selena Batten) Specialty: Family Medicine Contact information: [address]  [place] [address]  [phone]   John Giovanni, MD. Specialty: [facility] Contact information: [address]  [address]  [place] [address]  [phone]   Carylon Perches, MD. Specialty: Internal Medicine Contact information: [address]  [address]  [place] [address]  [phone]    [facility]  [address] [place], [place] [address] [phone]  Services [facility] offers a variety of basic health services.  Services include but are not limited to: Blood pressure checks  Heart rate checks  Blood sugar checks  Urine analysis  Rapid strep tests  Pregnancy tests.  Health education and referrals  People needing more complex services will be directed to a physician online. Using these virtual visits, doctors can evaluate and prescribe medicine and treatments. There  will be no medication on-site, though [facility] will help patients fill their prescriptions at little to no cost.   For More information please go to: [email or web address]

## 2023-09-03 NOTE — ED Provider Notes (Signed)
[facility] EMERGENCY DEPARTMENT AT [facility] Provider Note   CSN: [id number] Arrival date & time: 09/03/23  1019     History  Chief Complaint  Patient presents with   Chills    Louis VANDIJK is a 42 y.o. male.  Reports PMH of seasonal allergies, presents to ER complaining of headache, abdominal pain.  States he was at work this morning and developed some chills and headache that feels like his usual allergy symptoms but was worried because several people at work also have COVID-19.  He denies fever or cough or URI other URI symptoms.  States his headache has improved but he is now having abdominal pain diffusely, no vomiting, no diarrhea or constipation.  He is having intermittent chills.  HPI     Home Medications Prior to Admission medications   Medication Sig Start Date End Date Taking? Authorizing Provider  hydrocortisone (ANUSOL-HC) 2.5 % rectal cream Place 1 Application rectally 2 (two) times daily. 06/01/23   Jacalyn Lefevre, MD  meclizine (ANTIVERT) 12.5 MG tablet Take 1 tablet (12.5 mg total) by mouth 3 (three) times daily as needed for dizziness. Do not take with alcohol or while driving or operating heavy machinery.  May cause drowsiness. Patient not taking: Reported on 06/03/2023 05/14/23   Valentino Nose, NP      Allergies    Bee venom    Review of Systems   Review of Systems  Physical Exam Updated Vital Signs BP 138/87   Pulse 66   Temp 98.3 F (36.8 C)   Resp 18   Ht 5\' 8"  (1.727 m)   SpO2 98%   BMI 28.43 kg/m  Physical Exam Vitals and nursing note reviewed.  Constitutional:      General: He is not in acute distress.    Appearance: He is well-developed.  HENT:     Head: Normocephalic and atraumatic.  Eyes:     Conjunctiva/sclera: Conjunctivae normal.  Cardiovascular:     Rate and Rhythm: Normal rate and regular rhythm.     Heart sounds: No murmur heard. Pulmonary:     Effort: Pulmonary effort is normal. No respiratory distress.      Breath sounds: Normal breath sounds.  Abdominal:     Palpations: Abdomen is soft.     Tenderness: There is abdominal tenderness. There is guarding. There is no rebound.  Musculoskeletal:        General: No swelling.     Cervical back: Neck supple.  Skin:    General: Skin is warm and dry.     Capillary Refill: Capillary refill takes less than 2 seconds.  Neurological:     General: No focal deficit present.     Mental Status: He is alert and oriented to person, place, and time.  Psychiatric:        Mood and Affect: Mood normal.    ED Results / Procedures / Treatments   Labs (all labs ordered are listed, but only abnormal results are displayed) Labs Reviewed  SARS CORONAVIRUS 2 BY RT PCR  CBC WITH DIFFERENTIAL/PLATELET  COMPREHENSIVE METABOLIC PANEL  LIPASE, BLOOD  URINALYSIS, ROUTINE W REFLEX MICROSCOPIC    EKG None  Radiology No results found.  Procedures Procedures    Medications Ordered in ED Medications  morphine (PF) 4 MG/ML injection 4 mg (has no administration in time range)    ED Course/ Medical Decision Making/ A&P Clinical Course as of 09/03/23 1427  Tue Sep 03, 2023  1424 Presented with some  sinus congestion, headache and chills also having abdominal pain that started at work today.  No urinary symptoms, no vomiting or diarrhea or constipation.  Vitals are reassuring but ordered labs which are also very reassuring, he is feeling better after some morphine and Zofran.  Discussed with patient if he still having pain we should get a CT scan.  He states he feels quite a bit better and is actually very hungry.  He does not want a CT scan at this time, he states he would prefer to go home and see how he feels and states that he not feeling better or feels worse he will certainly come back.  We discussed risks and benefits of CT scan.  He does demonstrate decision-making capacity.  [CB]    Clinical Course User Index [CB] Ma Rings, PA-C                                  Medical Decision Making This patient presents to the ED for concern of headache, nasal congestion, abdominal pain, this involves an extensive number of treatment options, and is a complaint that carries with it a high risk of complications and morbidity.  The differential diagnosis includes redness, cholecystitis, UTI, appendicitis, URI, COVID-19, allergic rhinitis, other   Additional history obtained:  Additional history obtained from EMR External records from outside source obtained and reviewed including notes   Lab Tests:  I Ordered, and personally interpreted labs.  The pertinent results include: BC, CMP, lipase and UA are all reassuring, patient's bilirubin is slightly elevated but has been in the past to this level.       Problem List / ED Course / Critical interventions / Medication management  Patient came with  multiple complaints and wanting COVID testing exposure at work for the suffering some abdominal pain.  Given pain meds and nausea medicine and ordered labs which were overall reassuring for very mildly elevated bilirubin which has been elevated in the past and he has no Murphy sign, no severe right upper quadrant tenderness, no vomiting.  Discussed with patient since he has been having this pain we should get a CT but he declined, he states he feels better and is very hungry and wants to go home and eat.  He states he will come back if he has new or worsening symptoms and will surely follow-up with primary care.  Reassuring that he has an appetite his abdomen was soft with no rebound guarding or rigidity.  Discussed with him that we cannot rule out serious intra-abdominal cause such as cholecystitis, appendicitis, or other emergent cause of his pain without CT but he understands that if he starts having pain and feels worse he is to come back as these can be severe or life-threatening.  At this time his vitals and labs do not suggest he is having any abdominal  catastrophe  I have reviewed the patients home medicines and have made adjustments as needed      Amount and/or Complexity of Data Reviewed Labs: ordered.  Risk Prescription drug management.           Final Clinical Impression(s) / ED Diagnoses Final diagnoses:  None    Rx / DC Orders ED Discharge Orders     None         Josem Kaufmann 09/03/23 Dorothy Spark, MD 09/05/23 [phone]

## 2023-09-03 NOTE — ED Triage Notes (Signed)
Pt presents to ED with complaints of chills, headache, body aches started this am. Has been exposed to covid.

## 2023-09-03 NOTE — ED Notes (Signed)
Patient ambulatory to restroom independently. Steady gait noted

## 2023-09-07 ENCOUNTER — Other Ambulatory Visit: Payer: Self-pay

## 2023-09-07 ENCOUNTER — Encounter (HOSPITAL_COMMUNITY): Payer: Self-pay

## 2023-09-07 ENCOUNTER — Emergency Department (HOSPITAL_COMMUNITY): Payer: Medicaid Other

## 2023-09-07 ENCOUNTER — Emergency Department (HOSPITAL_COMMUNITY)
Admission: EM | Admit: 2023-09-07 | Discharge: 2023-09-07 | Disposition: A | Discharge status: Home / Self Care | Payer: Medicaid Other | Attending: Emergency Medicine | Admitting: Emergency Medicine

## 2023-09-07 DIAGNOSIS — R1033 Periumbilical pain: Secondary | ICD-10-CM

## 2023-09-07 DIAGNOSIS — K529 Noninfective gastroenteritis and colitis, unspecified: Secondary | ICD-10-CM | POA: Diagnosis not present

## 2023-09-07 DIAGNOSIS — F17200 Nicotine dependence, unspecified, uncomplicated: Secondary | ICD-10-CM | POA: Diagnosis not present

## 2023-09-07 DIAGNOSIS — R109 Unspecified abdominal pain: Secondary | ICD-10-CM | POA: Diagnosis not present

## 2023-09-07 DIAGNOSIS — J45909 Unspecified asthma, uncomplicated: Secondary | ICD-10-CM | POA: Insufficient documentation

## 2023-09-07 LAB — CBC WITH DIFFERENTIAL/PLATELET
Abs Immature Granulocytes: 0.04 10*3/uL (ref 0.00–0.07)
Basophils Absolute: 0 10*3/uL (ref 0.0–0.1)
Basophils Relative: 1 %
Eosinophils Absolute: 0.1 10*3/uL (ref 0.0–0.5)
Eosinophils Relative: 2 %
HCT: 44.8 % (ref 39.0–52.0)
Hemoglobin: 15.1 g/dL (ref 13.0–17.0)
Immature Granulocytes: 1 %
Lymphocytes Relative: 30 %
Lymphs Abs: 1.6 10*3/uL (ref 0.7–4.0)
MCH: 29.1 pg (ref 26.0–34.0)
MCHC: 33.7 g/dL (ref 30.0–36.0)
MCV: 86.3 fL (ref 80.0–100.0)
Monocytes Absolute: 0.5 10*3/uL (ref 0.1–1.0)
Monocytes Relative: 9 %
Neutro Abs: 3.1 10*3/uL (ref 1.7–7.7)
Neutrophils Relative %: 57 %
Platelets: 270 10*3/uL (ref 150–400)
RBC: 5.19 MIL/uL (ref 4.22–5.81)
RDW: 12.7 % (ref 11.5–15.5)
WBC: 5.4 10*3/uL (ref 4.0–10.5)
nRBC: 0 % (ref 0.0–0.2)

## 2023-09-07 LAB — COMPREHENSIVE METABOLIC PANEL
ALT: 26 U/L (ref 0–44)
AST: 17 U/L (ref 15–41)
Albumin: 4 g/dL (ref 3.5–5.0)
Alkaline Phosphatase: 88 U/L (ref 38–126)
Anion gap: 9 (ref 5–15)
BUN: 14 mg/dL (ref 6–20)
CO2: 25 mmol/L (ref 22–32)
Calcium: 8.6 mg/dL — ABNORMAL LOW (ref 8.9–10.3)
Chloride: 107 mmol/L (ref 98–111)
Creatinine, Ser: 0.77 mg/dL (ref 0.61–1.24)
GFR, Estimated: >60 mL/min (ref >60)
Glucose, Bld: 91 mg/dL (ref 70–99)
Potassium: 3.5 mmol/L (ref 3.5–5.1)
Sodium: 141 mmol/L (ref 135–145)
Total Bilirubin: 0.9 mg/dL (ref 0.3–1.2)
Total Protein: 6.1 g/dL — ABNORMAL LOW (ref 6.5–8.1)

## 2023-09-07 LAB — URINALYSIS, ROUTINE W REFLEX MICROSCOPIC
Bilirubin Urine: NEGATIVE
Glucose, UA: NEGATIVE mg/dL
Hgb urine dipstick: NEGATIVE
Ketones, ur: NEGATIVE mg/dL
Leukocytes,Ua: NEGATIVE
Nitrite: NEGATIVE
Protein, ur: NEGATIVE mg/dL
Specific Gravity, Urine: 1.008 (ref 1.005–1.030)
pH: 7 (ref 5.0–8.0)

## 2023-09-07 LAB — LIPASE, BLOOD: Lipase: 49 U/L (ref 11–51)

## 2023-09-07 MED ORDER — SODIUM CHLORIDE 0.9 % IV SOLN: INTRAVENOUS | Status: DC

## 2023-09-07 MED ORDER — IOHEXOL 300 MG/ML  SOLN
100.0000 mL | Freq: Once | INTRAMUSCULAR | Status: AC | PRN
  Administered 2023-09-07: 100 mL via INTRAVENOUS

## 2023-09-07 MED ORDER — HYDROMORPHONE HCL 1 MG/ML IJ SOLN
1.0000 mg | Freq: Once | INTRAMUSCULAR | Status: AC
  Administered 2023-09-07: 1 mg via INTRAVENOUS
  Filled 2023-09-07: qty 1

## 2023-09-07 MED ORDER — ONDANSETRON HCL 4 MG/2ML IJ SOLN
4.0000 mg | Freq: Once | INTRAMUSCULAR | Status: AC
  Administered 2023-09-07: 4 mg via INTRAVENOUS
  Filled 2023-09-07: qty 2

## 2023-09-07 NOTE — Discharge Instructions (Signed)
CT scan consistent with colitis.  But is fairly extensive so this could be a special type of colitis called ulcerative colitis.  Following up with gastroenterology very important.  They need to do a scope and consider biopsies.

## 2023-09-07 NOTE — ED Triage Notes (Addendum)
Pt c/o generalized abdominal pain x4 days and intermittent difficulty urinating x2 weeks.  Pain score 9/10.  Denies n/v/d.    Pt reports he was seen 4 days ago for similar.

## 2023-09-07 NOTE — ED Provider Notes (Addendum)
[place] EMERGENCY DEPARTMENT AT [facility] Provider Note   CSN: [id number] Arrival date & time: 09/07/23  0754     History  Chief Complaint  Patient presents with   Abdominal Pain   Urinary Retention    [name] is a 42 y.o. male.  Patient seen September 17 the concern that time was for COVID was COVID-negative.  Did have labs without significant abnormalities.  Patient states that he has been having generalized abdominal pain mostly periumbilical since Tuesday.  And also some difficulty urinating for 2 weeks.  Patient states pain score is 9 out of 10 denies nausea vomiting or diarrhea.  Patient is never had pain like this before.  Past medical history significant for attention deficit disorder and asthma.  Patient is an everyday smoker.       Home Medications Prior to Admission medications   Medication Sig Start Date End Date Taking? Authorizing Provider  hydrocortisone (ANUSOL-HC) 2.5 % rectal cream Place 1 Application rectally 2 (two) times daily. 06/01/23   Jacalyn Lefevre, MD  meclizine (ANTIVERT) 12.5 MG tablet Take 1 tablet (12.5 mg total) by mouth 3 (three) times daily as needed for dizziness. Do not take with alcohol or while driving or operating heavy machinery.  May cause drowsiness. Patient not taking: Reported on 06/03/2023 05/14/23   Valentino Nose, NP      Allergies    Bee venom    Review of Systems   Review of Systems  Constitutional:  Negative for chills and fever.  HENT:  Negative for ear pain and sore throat.   Eyes:  Negative for pain and visual disturbance.  Respiratory:  Negative for cough and shortness of breath.   Cardiovascular:  Negative for chest pain and palpitations.  Gastrointestinal:  Positive for abdominal pain. Negative for vomiting.  Genitourinary:  Negative for dysuria and hematuria.  Musculoskeletal:  Negative for arthralgias and back pain.  Skin:  Negative for color change and rash.  Neurological:  Negative for  seizures and syncope.  All other systems reviewed and are negative.   Physical Exam Updated Vital Signs BP (!) 136/97   Pulse (!) 55   Temp 98.1 F (36.7 C) (Oral)   Resp 16   Ht 1.727 m (5\' 8" )   SpO2 96%   BMI 28.43 kg/m  Physical Exam Vitals and nursing note reviewed.  Constitutional:      General: He is not in acute distress.    Appearance: Normal appearance. He is well-developed.  HENT:     Head: Normocephalic and atraumatic.  Eyes:     Extraocular Movements: Extraocular movements intact.     Conjunctiva/sclera: Conjunctivae normal.     Pupils: Pupils are equal, round, and reactive to light.  Cardiovascular:     Rate and Rhythm: Normal rate and regular rhythm.     Heart sounds: No murmur heard. Pulmonary:     Effort: Pulmonary effort is normal. No respiratory distress.     Breath sounds: Normal breath sounds.  Abdominal:     General: There is no distension.     Palpations: Abdomen is soft.     Tenderness: There is no abdominal tenderness. There is no guarding.  Musculoskeletal:        General: No swelling.     Cervical back: Normal range of motion and neck supple.  Skin:    General: Skin is warm and dry.     Capillary Refill: Capillary refill takes less than 2 seconds.  Neurological:  General: No focal deficit present.     Mental Status: He is alert and oriented to person, place, and time.  Psychiatric:        Mood and Affect: Mood normal.     ED Results / Procedures / Treatments   Labs (all labs ordered are listed, but only abnormal results are displayed) Labs Reviewed  COMPREHENSIVE METABOLIC PANEL - Abnormal; Notable for the following components:      Result Value   Calcium 8.6 (*)    Total Protein 6.1 (*)    All other components within normal limits  URINALYSIS, ROUTINE W REFLEX MICROSCOPIC - Abnormal; Notable for the following components:   Color, Urine STRAW (*)    All other components within normal limits  CBC WITH DIFFERENTIAL/PLATELET   LIPASE, BLOOD    EKG None  Radiology CT ABDOMEN PELVIS W CONTRAST  Result Date: 09/07/2023 CLINICAL DATA:  42 year old male with history of acute onset of nonlocalized abdominal pain. EXAM: CT ABDOMEN AND PELVIS WITH CONTRAST TECHNIQUE: Multidetector CT imaging of the abdomen and pelvis was performed using the standard protocol following bolus administration of intravenous contrast. RADIATION DOSE REDUCTION: This exam was performed according to the departmental dose-optimization program which includes automated exposure control, adjustment of the mA and/or kV according to patient size and/or use of iterative reconstruction technique. CONTRAST:  OMNIPAQUE IOHEXOL 300 MG/ML  SOLN COMPARISON:  No priors. FINDINGS: Lower chest: Unremarkable. Hepatobiliary: No suspicious cystic or solid hepatic lesions. No intra or extrahepatic biliary ductal dilatation. Gallbladder is unremarkable in appearance. Pancreas: No pancreatic mass. No pancreatic ductal dilatation. No peripancreatic fluid collections or inflammatory changes. Spleen: Unremarkable. Adrenals/Urinary Tract: Bilateral kidneys and adrenal glands are normal in appearance. No hydroureteronephrosis. Urinary bladder is unremarkable in appearance. Stomach/Bowel: The appearance of the stomach is normal. No pathologic dilatation of small bowel or colon. Mural thickening and mucosal hyperenhancement noted extending from the level of the rectum proximally to the level of the mid transverse colon, concerning for potential colitis. No overt surrounding inflammatory changes are noted at this time. Normal appendix. Vascular/Lymphatic: No significant atherosclerotic disease, aneurysm or dissection noted in the abdominal or pelvic vasculature. No lymphadenopathy noted in the abdomen or pelvis. Reproductive: Prostate gland and seminal vesicles are unremarkable in appearance. Other: No significant volume of ascites.  No pneumoperitoneum. Musculoskeletal: There are no  aggressive appearing lytic or blastic lesions noted in the visualized portions of the skeleton. IMPRESSION: 1. Mild mural thickening and mucosal hyperenhancement extending from the level of the rectum proximally to the level of the mid transverse colon. Findings are suggestive of colitis, and given the contiguous nature of the findings, clinical correlation for signs and symptoms of potential ulcerative colitis is recommended. 2. No other potential acute findings are noted elsewhere in the abdomen or pelvis to account for the patient's symptoms. Electronically Signed   By: Trudie Reed M.D.   On: 09/07/2023 10:27    Procedures Procedures    Medications Ordered in ED Medications  0.9 %  sodium chloride infusion ( Intravenous New Bag/Given 09/07/23 0902)  ondansetron (ZOFRAN) injection 4 mg (4 mg Intravenous Given 09/07/23 0906)  HYDROmorphone (DILAUDID) injection 1 mg (1 mg Intravenous Given 09/07/23 0906)  iohexol (OMNIPAQUE) 300 MG/ML solution 100 mL (100 mLs Intravenous Contrast Given 09/07/23 6962)    ED Course/ Medical Decision Making/ A&P  Medical Decision Making Amount and/or Complexity of Data Reviewed Labs: ordered. Radiology: ordered.  Risk Prescription drug management.   Will repeat patient's labs.  Will get CT scan abdomen and pelvis will treat with some pain medicine give IV fluids.  Symptoms mostly periumbilical but is always possible this could be epigastric or peptic ulcer disease pain.  Also will get urinalysis to evaluate the difficulty urinating.  And will do bladder scan.  Labs without any significant abnormalities per urinalysis normal no leukocytosis hemoglobin good at 15.1 complete metabolic panel liver function test normal renal function normal lipase normal at 49.  CT scan of the abdomen consistent with a colitis but is fairly extensive kind of going from the level of the rectum proximal to the level of the mid transverse colon.   So this could be ulcerative colitis to follow-up with gastroenterology very important.  Patient nontoxic no acute distress does not require admission at this time.    Final Clinical Impression(s) / ED Diagnoses Final diagnoses:  Periumbilical abdominal pain  Colitis    Rx / DC Orders ED Discharge Orders     None         Vanetta Mulders, MD 09/07/23 1914    Vanetta Mulders, MD 09/07/23 1224

## 2023-09-09 ENCOUNTER — Encounter: Payer: Self-pay | Admitting: Gastroenterology

## 2023-09-09 ENCOUNTER — Ambulatory Visit (INDEPENDENT_AMBULATORY_CARE_PROVIDER_SITE_OTHER): Payer: Medicaid Other | Admitting: Gastroenterology

## 2023-09-09 VITALS — BP 135/87 | HR 65 | Temp 98.4°F | Ht 70.0 in | Wt 193.2 lb

## 2023-09-09 DIAGNOSIS — K529 Noninfective gastroenteritis and colitis, unspecified: Secondary | ICD-10-CM | POA: Diagnosis not present

## 2023-09-09 DIAGNOSIS — R109 Unspecified abdominal pain: Secondary | ICD-10-CM

## 2023-09-09 NOTE — Progress Notes (Signed)
GI Office Note    Referring Provider: No ref. provider found Primary Care Physician:  Pcp, No  Primary Gastroenterologist: Dr. Tasia Catchings  Chief Complaint   Chief Complaint  Patient presents with   Abdominal Pain    Lower stomach pains. Went to the ED and was told he had colitis. Was told he needs a colonoscopy. Seen blood in his stool a couple months ago.     History of Present Illness   [name] is a 42 y.o. male presenting today at the request of No ref. provider found for ED follow up of abdominal pain and colitis.  ED visit 09/07/23 for abdominal pain.  Labs unremarkable other than mildly low calcium at 8.6 and total protein 6.1.  UA normal.  Normal CBC and lipase.  He was treated with Dilaudid, Zofran, and IV fluids  CT A/P 09/07/23: -No pancreatic or hepatobiliary abnormality -Spleen unremarkable -Mural thickening and mucosal hyperenhancement noted extending from the level of the rectum approximately to the level of the mid transverse colon concerning for colitis (advised to correlate for signs and symptoms of ulcerative colitis) -No other abnormality noted  Today: Weight loss - Very active in the gym. Last time he was 198 when he weight and now down to 192.  Fever - none Rectal bleeding - had some bleeding a few months ago. Was having hemorrhoids int he past and the bleeding would stop after using creams. Last occurrence was a year ago.  Is active and admits to heavy lifting in the past.  Diarrhea - does not usually  have diarrhea. Stools are harder, more so has constipation but generally no issue every couple of days.   Abdominal pain - does not worsen with eating. Pain is usually more in the morning. Last Tuesday was worse its been at in a very long time. No pain since Saturday. Scared to eat normal. States he was not given information on what to do for diet.   Nausea/Vomiting- none Urgency - with urination but not with bowel movements. Sometimes goes twice a day and  sometimes may skip day.  Incontinence -non  NSAIDs -  ibuprofen 200 mg (takes 2 at time). Every now and then.  Tobacco use - 0.5 ppd Alcohol - has not had anythign in 3 weeks. May have an occasional   Mouth lesions - none  Vision changes - none  At night when sleeping he feels himself jump and feels some numbness into his hands.  Will take pepto to help with acid reflux. Does not need it everyday. Does not eat salsa at [employer]. Has been avoiding sodas  Tuesday last week had abdominal pain and then continued on until about Saturday. He left without testing being done Tuesday - did blood work (thought kidney stones and offered more tests but he wanted to go home). Tried to do fluids and rest.   No family history that he is aware of of IBD/IBS. Has lots of family history of cancer.   No current outpatient medications on file.   No current facility-administered medications for this visit.    Past Medical History:  Diagnosis Date   ADHD (attention deficit hyperactivity disorder)    Asthma     Past Surgical History:  Procedure Laterality Date   head surgery (as a baby)      Family History  Problem Relation Age of Onset   Healthy Mother    Healthy Father    Cancer Maternal Uncle  Heart disease Maternal Uncle    Colon cancer Neg Hx    Colon polyps Neg Hx     Allergies as of 09/09/2023 - Review Complete 09/09/2023  Allergen Reaction Noted   Bee venom Anaphylaxis 08/31/2012    Social History   Socioeconomic History   Marital status: Single    Spouse name: Not on file   Number of children: Not on file   Years of education: Not on file   Highest education level: Not on file  Occupational History   Not on file  Tobacco Use   Smoking status: Every Day    Current packs/day: 0.50    Types: Cigarettes   Smokeless tobacco: Never  Vaping Use   Vaping status: Never Used  Substance and Sexual Activity   Alcohol use: Yes    Comment: occasional   Drug use:  Not Currently    Types: Cocaine    Comment: 2016   Sexual activity: Yes  Other Topics Concern   Not on file  Social History Narrative   Not on file   Social Determinants of Health   Financial Resource Strain: Not on file  Food Insecurity: Not on file  Transportation Needs: No Transportation Needs (06/03/2023)   PRAPARE - Administrator, Civil Service (Medical): No    Lack of Transportation (Non-Medical): No  Physical Activity: Not on file  Stress: Not on file  Social Connections: Not on file  Intimate Partner Violence: Not on file   Review of Systems   Gen: Denies any fever, chills, fatigue, weight loss, lack of appetite.  CV: Denies chest pain, heart palpitations, peripheral edema, syncope.  Resp: Denies shortness of breath at rest or with exertion. Denies wheezing or cough.  GI: see HPI GU : Denies urinary burning, urinary frequency, urinary hesitancy MS: Denies joint pain, muscle weakness, cramps, or limitation of movement.  Derm: Denies rash, itching, dry skin Psych: + ADHD. Denies depression, anxiety, memory loss, and confusion Heme: Denies bruising, bleeding, and enlarged lymph nodes.  Physical Exam   BP 135/87 (BP Location: Right Arm, Patient Position: Sitting, Cuff Size: Normal)   Pulse 65   Temp 98.4 F (36.9 C) (Temporal)   Ht 5\' 10"  (1.778 m)   Wt 193 lb 3.2 oz (87.6 kg)   BMI 27.72 kg/m   General:   Alert and oriented. Pleasant and cooperative. Well-nourished and well-developed.  Head:  Normocephalic and atraumatic. Eyes:  Without icterus, sclera clear and conjunctiva pink.  Ears:  Normal auditory acuity. Mouth:  No deformity or lesions, oral mucosa pink.  Lungs:  Clear to auscultation bilaterally. No wheezes, rales, or rhonchi. No distress.  Heart:  S1, S2 present without murmurs appreciated.  Abdomen:  +BS, soft, non-tender and non-distended. No HSM noted. No guarding or rebound. No masses appreciated.  Rectal:  Deferred  Msk:  Symmetrical  without gross deformities. Normal posture. Extremities:  Without edema. Neurologic:  Alert and  oriented x4;  grossly normal neurologically. Skin:  Intact without significant lesions or rashes. Psych:  Alert and cooperative. Normal mood and affect.   Assessment   [name] is a 42 y.o. male with a history of asthma and ADHD presenting today at the recommendation of the ED given abdominal pain and colitis on recent CT scan.  Colitis, abdominal pain: Recent ED visit x 2.  On 917 presented with abdominal pain as well as some chills and had blood work performed and recommended to have some additional testing and initial  thought was that he possibly had kidney stones.  Pain slightly improved and then worsened again this past Saturday on 9/21 and went to the ED at which time he had a CT scan that revealed extensive colitis extending from the rectum to the mid transverse colon concerning for potential ulcerative colitis.  He does have some history of intermittent rectal bleeding however he has attributed this to history of hemorrhoids.  Has not had any more abdominal pain since Saturday and denies any diarrhea, actually has been having more constipation.  Denies any urgency.  No family history of IBD, colon cancer, or diverticulitis.  Denies any frequent NSAID use but does admit to tobacco use and occasional alcohol use.  No mention of diverticulitis on recent scans but also was not having any diarrhea or recent fevers therefore infection less likely but still probable.  Will try to get stool studies to rule out infection and check CRP and other inflammatory markers as well as HSV to assess for cause of colitis.  If stool studies positive we will treat with antibiotics otherwise we will plan for colonoscopy in the near future to assess for etiology of colitis to rule out/rule in ulcerative colitis versus Crohn's.  Will provide a short course of dicyclomine to use as needed for severe refractory abdominal  pain. Will not plan to prescribe steroids until infection ruled out.   PLAN   CRP, HSV, fecal calprotectin, stool studies ([facility]) Dicyclomine 10 mg as needed for pain.  Potentially treat with antibiotics if stool studies positive Colonoscopy in 3-4 weeks with Dr. Tasia Catchings. ASA 2 I have discussed the risks, alternatives, benefits with regards to but not limited to the risk of reaction to medication, bleeding, infection, perforation and the patient is agreeable to proceed. Written consent to be obtained. Education on colitis or ulcerative colitis Work note provided today. Ask about work Transport planner.  Follow up in 8 weeks   Brooke Bonito, MSN, FNP-BC, AGACNP-BC [facility]

## 2023-09-09 NOTE — Patient Instructions (Addendum)
We will get scheduled in the near future for colonoscopy.  Once we get you scheduled for a date please let your work know and you likely should plan to be off the day prior as well.  Please ask family or friends if you have anyone that would be able to accompany you the day of or else we cannot do the procedure.  Please have blood work completed at [facility].  We will call you with results once they have been received. Please allow 3-5 business days for review.  This will be with blood work and stool studies.  Please pick up your supplies to give a stool sample. 2 locations for [facility] in [place]:              1. [address],               2. [address] [place]   I have sent in dicyclomine 10 mg for you to use as needed for abdominal pain when it becomes severe.  We can determine the potential need for antibiotics after receiving your stool study results.  Please ask your human resources about FMLA paperwork.  I will provide a doctor's note for you today.  I will see you for follow-up in about 8 weeks.  It was a pleasure to see you today. I want to create trusting relationships with patients. If you receive a survey regarding your visit,  I greatly appreciate you taking time to fill this out on paper or through your MyChart. I value your feedback.  Brooke Bonito, MSN, FNP-BC, AGACNP-BC [facility]

## 2023-09-10 ENCOUNTER — Other Ambulatory Visit: Payer: Self-pay | Admitting: Gastroenterology

## 2023-09-10 ENCOUNTER — Telehealth: Payer: Self-pay | Admitting: Gastroenterology

## 2023-09-10 MED ORDER — DICYCLOMINE HCL 10 MG PO CAPS: 10.0000 mg | ORAL_CAPSULE | Freq: Two times a day (BID) | ORAL | 1 refills | Status: DC | PRN

## 2023-09-10 NOTE — Telephone Encounter (Signed)
Spoke to pt, informed him that medication is at pharmacy and would be ready after 3. Pt voiced understanding.

## 2023-09-10 NOTE — Telephone Encounter (Signed)
Patient stopped by the office to find out when his medication would be called in to the drugstore.  He said he checked yesterday and has been there this morning and they don't have it.  He said he was in a lot of pain.

## 2023-09-11 ENCOUNTER — Telehealth: Payer: Self-pay | Admitting: *Deleted

## 2023-09-11 NOTE — Telephone Encounter (Signed)
Called pt to schedule him for TCS w/Dr.Ahmed ASA 2. VM not set up, unable to leave message.

## 2023-09-17 NOTE — Telephone Encounter (Signed)
Spoke to pt and advised him will have to give him a call when get providers November schedule.

## 2023-09-21 ENCOUNTER — Emergency Department (HOSPITAL_COMMUNITY)
Admission: EM | Admit: 2023-09-21 | Discharge: 2023-09-21 | Disposition: A | Discharge status: Home / Self Care | Payer: Medicaid Other | Attending: Emergency Medicine | Admitting: Emergency Medicine

## 2023-09-21 ENCOUNTER — Other Ambulatory Visit: Payer: Self-pay

## 2023-09-21 ENCOUNTER — Encounter (HOSPITAL_COMMUNITY): Payer: Self-pay | Admitting: Emergency Medicine

## 2023-09-21 DIAGNOSIS — K529 Noninfective gastroenteritis and colitis, unspecified: Secondary | ICD-10-CM | POA: Diagnosis not present

## 2023-09-21 DIAGNOSIS — R1084 Generalized abdominal pain: Secondary | ICD-10-CM

## 2023-09-21 DIAGNOSIS — Z8719 Personal history of other diseases of the digestive system: Secondary | ICD-10-CM

## 2023-09-21 LAB — CBC
HCT: 47.6 % (ref 39.0–52.0)
Hemoglobin: 15.5 g/dL (ref 13.0–17.0)
MCH: 28.4 pg (ref 26.0–34.0)
MCHC: 32.6 g/dL (ref 30.0–36.0)
MCV: 87.3 fL (ref 80.0–100.0)
Platelets: 259 10*3/uL (ref 150–400)
RBC: 5.45 MIL/uL (ref 4.22–5.81)
RDW: 12.5 % (ref 11.5–15.5)
WBC: 6.1 10*3/uL (ref 4.0–10.5)
nRBC: 0 % (ref 0.0–0.2)

## 2023-09-21 LAB — COMPREHENSIVE METABOLIC PANEL
ALT: 27 U/L (ref 0–44)
AST: 16 U/L (ref 15–41)
Albumin: 4.5 g/dL (ref 3.5–5.0)
Alkaline Phosphatase: 101 U/L (ref 38–126)
Anion gap: 6 (ref 5–15)
BUN: 19 mg/dL (ref 6–20)
CO2: 28 mmol/L (ref 22–32)
Calcium: 8.9 mg/dL (ref 8.9–10.3)
Chloride: 106 mmol/L (ref 98–111)
Creatinine, Ser: 0.79 mg/dL (ref 0.61–1.24)
GFR, Estimated: >60 mL/min (ref >60)
Glucose, Bld: 98 mg/dL (ref 70–99)
Potassium: 4.1 mmol/L (ref 3.5–5.1)
Sodium: 140 mmol/L (ref 135–145)
Total Bilirubin: 1.1 mg/dL (ref 0.3–1.2)
Total Protein: 6.5 g/dL (ref 6.5–8.1)

## 2023-09-21 LAB — URINALYSIS, ROUTINE W REFLEX MICROSCOPIC
Bilirubin Urine: NEGATIVE
Glucose, UA: NEGATIVE mg/dL
Hgb urine dipstick: NEGATIVE
Ketones, ur: NEGATIVE mg/dL
Leukocytes,Ua: NEGATIVE
Nitrite: NEGATIVE
Protein, ur: NEGATIVE mg/dL
Specific Gravity, Urine: 1.023 (ref 1.005–1.030)
pH: 5 (ref 5.0–8.0)

## 2023-09-21 LAB — LIPASE, BLOOD: Lipase: 27 U/L (ref 11–51)

## 2023-09-21 MED ORDER — AMOXICILLIN-POT CLAVULANATE 875-125 MG PO TABS
1.0000 | ORAL_TABLET | Freq: Two times a day (BID) | ORAL | 0 refills | Status: DC
Start: 2023-09-21 — End: 2023-11-04

## 2023-09-21 NOTE — ED Provider Notes (Signed)
[place] EMERGENCY DEPARTMENT AT [facility] Provider Note   CSN: [id number] Arrival date & time: 09/21/23  1201     History  Chief Complaint  Patient presents with   Abdominal Pain    JACQUEL MCCAMISH is a 42 y.o. male.   Abdominal Pain Associated symptoms: no chest pain, no chills, no diarrhea, no dysuria, no fever, no nausea, no shortness of breath and no vomiting         DONNEL VENUTO is a 42 y.o. male who presents to the Emergency Department complaining of recurrent diffuse abdominal pain.  Was seen here last month diagnosed with colitis.  There was concern for ulcerative colitis.  He has followed up with GI, colonoscopy scheduled for next month and he has been taking Bentyl as directed.  He states his symptoms worsened again today.  Having cramping diffuse abdominal pain.  Denies any diarrhea, fever, vomiting black or bloody stools.  States his stools have form.  Has not taken antibiotics.  He is concerned about losing his job as he is taken multiple days off from his job due to his recent symptoms.    Home Medications Prior to Admission medications   Medication Sig Start Date End Date Taking? Authorizing Provider  dicyclomine (BENTYL) 10 MG capsule Take 1 capsule (10 mg total) by mouth 2 (two) times daily as needed for spasms (abdominal pain). 09/10/23   Aida Raider, NP      Allergies    Bee venom    Review of Systems   Review of Systems  Constitutional:  Negative for appetite change, chills and fever.  Respiratory:  Negative for shortness of breath.   Cardiovascular:  Negative for chest pain.  Gastrointestinal:  Positive for abdominal pain. Negative for abdominal distention, blood in stool, diarrhea, nausea and vomiting.  Genitourinary:  Negative for difficulty urinating, dysuria and flank pain.  Neurological:  Negative for dizziness, weakness and numbness.    Physical Exam Updated Vital Signs BP 134/78 (BP Location: Right Arm)   Pulse 71    Temp 98.1 F (36.7 C)   Resp 17   Ht 5\' 11"  (1.803 m)   Wt 87.5 kg   SpO2 96%   BMI 26.92 kg/m  Physical Exam Vitals and nursing note reviewed.  Constitutional:      General: He is not in acute distress.    Appearance: Normal appearance. He is well-developed. He is not ill-appearing or toxic-appearing.  HENT:     Mouth/Throat:     Mouth: Mucous membranes are moist.  Cardiovascular:     Rate and Rhythm: Normal rate and regular rhythm.     Pulses: Normal pulses.  Pulmonary:     Effort: Pulmonary effort is normal.  Abdominal:     Palpations: Abdomen is soft.     Tenderness: There is no abdominal tenderness.  Musculoskeletal:     Right lower leg: No edema.     Left lower leg: No edema.  Skin:    General: Skin is warm.     Capillary Refill: Capillary refill takes less than 2 seconds.  Neurological:     General: No focal deficit present.     Mental Status: He is alert.     Sensory: No sensory deficit.     Motor: No weakness.     ED Results / Procedures / Treatments   Labs (all labs ordered are listed, but only abnormal results are displayed) Labs Reviewed  LIPASE, BLOOD  COMPREHENSIVE METABOLIC PANEL  CBC  URINALYSIS, ROUTINE W REFLEX MICROSCOPIC    EKG None  Radiology No results found.  Procedures Procedures    Medications Ordered in ED Medications - No data to display  ED Course/ Medical Decision Making/ A&P                                 Medical Decision Making Patient here with recurrent abdominal pain, recently seen here last month diagnosed with colitis.  Was concern for ulcerative colitis.  No known history.  Returns today due to recurrent abdominal pain described as diffuse and crampy denies any change in stool, specifically no mucousy stools, black or bloody stools or diarrhea.  No fever or vomiting.  Here requesting his colonoscopy date be moved up.  On review of notes from GI, patient was post to provide stool samples.  He states he has not  given samples yet.  Was started on Bentyl but no antibiotics.  On exam today.  Mild diffuse abdominal pain without guarding or abdominal distention.  Doubt acute process.  His vital signs are reassuring.  Will check labs.  No concerning symptoms for sepsis.  Amount and/or Complexity of Data Reviewed Labs: ordered.    Details: Labs reviewed by me, essentially unremarkable.  No leukocytosis, chemistries without derangement. Discussion of management or test interpretation with external provider(s): Patient here with CT findings of colitis in September.  Had follow-up with GI, was improving after Bentyl until symptoms recurred today.  Having abdominal pain without change in stools fever or vomiting.  Patient has not yet provided stool samples.  No indication for need of repeat imaging today.  Scheduled for colonoscopy next month patient is requesting that colonoscopy date be moved up.  I feel it is reasonable to go ahead and start antibiotics today and have him follow back up with GI.  He is agreeable to this plan.  He appears appropriate for discharge home.  Return precautions were also discussed.           Final Clinical Impression(s) / ED Diagnoses Final diagnoses:  Generalized abdominal pain  History of colitis    Rx / DC Orders ED Discharge Orders     None         Pauline Aus, PA-C 09/21/23 1505    Bethann Berkshire, MD 09/24/23 1013

## 2023-09-21 NOTE — ED Triage Notes (Signed)
Pt presents with reoccurring abdominal pain, diagnosed with colitis, scheduled for colonoscopy next month, but to painful to wait that long, per pt.

## 2023-09-21 NOTE — Discharge Instructions (Addendum)
Continue to take your dicyclomine as directed.  Take the antibiotic as directed until finished.  Please call the GI's office on Monday to arrange follow-up appointment.

## 2023-09-21 NOTE — ED Notes (Signed)
Pt was given urine cup to provide sample.

## 2023-09-22 ENCOUNTER — Emergency Department (HOSPITAL_COMMUNITY)
Admission: EM | Admit: 2023-09-22 | Discharge: 2023-09-22 | Disposition: A | Discharge status: Home / Self Care | Payer: Medicaid Other

## 2023-09-22 ENCOUNTER — Other Ambulatory Visit: Payer: Self-pay

## 2023-09-22 ENCOUNTER — Encounter (HOSPITAL_COMMUNITY): Payer: Self-pay

## 2023-09-22 DIAGNOSIS — M5459 Other low back pain: Secondary | ICD-10-CM | POA: Diagnosis not present

## 2023-09-22 DIAGNOSIS — M549 Dorsalgia, unspecified: Secondary | ICD-10-CM | POA: Insufficient documentation

## 2023-09-22 DIAGNOSIS — M545 Low back pain, unspecified: Secondary | ICD-10-CM

## 2023-09-22 MED ORDER — METHOCARBAMOL 500 MG PO TABS: 500.0000 mg | ORAL_TABLET | Freq: Two times a day (BID) | ORAL | 0 refills | Status: DC

## 2023-09-22 MED ORDER — LIDOCAINE 4 % EX PTCH: 1.0000 | MEDICATED_PATCH | CUTANEOUS | 0 refills | Status: DC

## 2023-09-22 MED ORDER — KETOROLAC TROMETHAMINE 60 MG/2ML IM SOLN
15.0000 mg | Freq: Once | INTRAMUSCULAR | Status: AC
  Administered 2023-09-22: 15 mg via INTRAMUSCULAR
  Filled 2023-09-22: qty 2

## 2023-09-22 MED ORDER — LIDOCAINE 5 % EX PTCH
1.0000 | MEDICATED_PATCH | Freq: Once | CUTANEOUS | Status: DC
  Administered 2023-09-22: 1 via TRANSDERMAL
  Filled 2023-09-22: qty 1

## 2023-09-22 MED ORDER — PREDNISONE 10 MG PO TABS: 20.0000 mg | ORAL_TABLET | Freq: Every day | ORAL | 0 refills | Status: AC

## 2023-09-22 NOTE — Discharge Instructions (Addendum)
Continue take the antibiotics as prescribed to you.  May take over-the-counter Tylenol turning with Motrin for your back pain.  You may also use the lidocaine patch we are prescribing.  Also, we are giving you a muscle relaxer.  Please do not drive or operate heavy machinery while you are taking this medication.  We are also prescribing you steroids.  Please follow-up with your primary doctor.  Return immediately if develop fevers, chills, severe pain, bowel or bladder incontinence, weakness in your lower extremities, numbness in your genitals or any new or worsening symptoms that are concerning to you.

## 2023-09-22 NOTE — ED Provider Notes (Signed)
[place] EMERGENCY DEPARTMENT AT [facility] Provider Note   CSN: [id number] Arrival date & time: 09/22/23  4098     History  Chief Complaint  Patient presents with   Back Pain    [name] is a 42 y.o. male.  Presenting the emergency department for back pain.  Reports started yesterday while he was in the emergency department sitting while sitting in the waiting room.Marland Kitchen  Has not taking any medications for it.  Denies any weakness in his lower extremities.  No bowel or bladder numbness.  No fevers, no immunosuppressive medications, no history of IV drug use, no history of cancer.   Back Pain      Home Medications Prior to Admission medications   Medication Sig Start Date End Date Taking? Authorizing Provider  amoxicillin-clavulanate (AUGMENTIN) 875-125 MG tablet Take 1 tablet by mouth every 12 (twelve) hours. 09/21/23   Triplett, Tammy, PA-C  dicyclomine (BENTYL) 10 MG capsule Take 1 capsule (10 mg total) by mouth 2 (two) times daily as needed for spasms (abdominal pain). 09/10/23   Aida Raider, NP      Allergies    Bee venom    Review of Systems   Review of Systems  Musculoskeletal:  Positive for back pain.    Physical Exam Updated Vital Signs BP (!) 130/91   Pulse 66   Temp 98.2 F (36.8 C) (Oral)   Resp 16   Ht 5\' 11"  (1.803 m)   Wt 89.8 kg   SpO2 96%   BMI 27.62 kg/m  Physical Exam Vitals and nursing note reviewed.  Constitutional:      General: He is not in acute distress.    Appearance: He is not ill-appearing.  HENT:     Head: Normocephalic.     Nose: Nose normal.     Mouth/Throat:     Mouth: Mucous membranes are moist.  Cardiovascular:     Rate and Rhythm: Normal rate and regular rhythm.  Pulmonary:     Effort: Pulmonary effort is normal.  Abdominal:     General: Abdomen is flat. There is no distension.     Tenderness: There is no abdominal tenderness. There is no guarding or rebound.  Musculoskeletal:     Comments: 5 out  of 5 plantarflexion, 5 5 dorsiflexion.  5 out of 5 hip extension.  Normal sensation of lower extremities.  No midline spinal tenderness.  Neurological:     Mental Status: He is alert.     Gait: Gait normal.     ED Results / Procedures / Treatments   Labs (all labs ordered are listed, but only abnormal results are displayed) Labs Reviewed - No data to display  EKG None  Radiology No results found.  Procedures Procedures    Medications Ordered in ED Medications  lidocaine (LIDODERM) 5 % 1 patch (has no administration in time range)  ketorolac (TORADOL) injection 15 mg (has no administration in time range)    ED Course/ Medical Decision Making/ A&P Clinical Course as of 09/22/23 0940  Sun Sep 22, 2023  0809 CT abd 09/07/23 per chart review: "IMPRESSION: 1. Mild mural thickening and mucosal hyperenhancement extending from the level of the rectum proximally to the level of the mid transverse colon. Findings are suggestive of colitis, and given the contiguous nature of the findings, clinical correlation for signs and symptoms of potential ulcerative colitis is recommended. 2. No other potential acute findings are noted elsewhere in the abdomen or pelvis to  account for the patient's symptoms " [TY]  [id number] Per chart review, was seen in the emergency department yesterday for abdominal pain.  Repeat labs reassuring discharge with antibiotics for suspected colitis. [TY]    Clinical Course User Index [TY] Coral Spikes, DO                                 Medical Decision Making Well-appearing 42 year old male present emergency department for back pain.  Likely musculoskeletal.  Afebrile nontachycardic.  Physical exam without red flag findings for acute cord compression.  Being worked up for colitis possible Crohn disease.  Was discharged with antibiotics yesterday.  Given Toradol and lidocaine patch here.  Ability stable for discharge.  Follow-up PCP.  Risk Prescription drug  management.          Final Clinical Impression(s) / ED Diagnoses Final diagnoses:  None    Rx / DC Orders ED Discharge Orders     None         Coral Spikes, DO 09/22/23 0940

## 2023-09-22 NOTE — ED Triage Notes (Signed)
Pt c/o back pain that is causing him not to be able to stay at work. Pt was here yesterday and dx with colitis. Pt voiced some nausea but noted eating and drinking in waiting room. Pt also voiced some pain in thigh area of right leg.

## 2023-09-23 ENCOUNTER — Ambulatory Visit (INDEPENDENT_AMBULATORY_CARE_PROVIDER_SITE_OTHER): Payer: Medicaid Other | Admitting: Gastroenterology

## 2023-09-23 ENCOUNTER — Encounter: Payer: Self-pay | Admitting: Gastroenterology

## 2023-09-23 VITALS — BP 125/86 | HR 65 | Temp 98.0°F | Ht 71.0 in | Wt 190.2 lb

## 2023-09-23 DIAGNOSIS — K5900 Constipation, unspecified: Secondary | ICD-10-CM

## 2023-09-23 DIAGNOSIS — R109 Unspecified abdominal pain: Secondary | ICD-10-CM | POA: Diagnosis not present

## 2023-09-23 DIAGNOSIS — R110 Nausea: Secondary | ICD-10-CM | POA: Diagnosis not present

## 2023-09-23 DIAGNOSIS — K529 Noninfective gastroenteritis and colitis, unspecified: Secondary | ICD-10-CM

## 2023-09-23 DIAGNOSIS — R112 Nausea with vomiting, unspecified: Secondary | ICD-10-CM

## 2023-09-23 MED ORDER — PANTOPRAZOLE SODIUM 40 MG PO TBEC
40.0000 mg | DELAYED_RELEASE_TABLET | Freq: Every day | ORAL | 2 refills | Status: DC
Start: 2023-09-23 — End: 2023-11-04

## 2023-09-23 MED ORDER — DICYCLOMINE HCL 10 MG PO CAPS: 10.0000 mg | ORAL_CAPSULE | Freq: Three times a day (TID) | ORAL | 1 refills | Status: DC | PRN

## 2023-09-23 NOTE — Progress Notes (Unsigned)
GI Office Note    Referring Provider: No ref. provider found Primary Care Physician:  Pcp, No Primary Gastroenterologist: Dr. Tasia Catchings  Date:  09/23/2023  ID:  [name], DOB 09/06/1981, MRN [id number]   Chief Complaint   Chief Complaint  Patient presents with   Follow-up    Pt wants his procedure move up sooner   History of Present Illness  [name] is a 42 y.o. male with a history of asthma and ADHD presenting today for follow-up of colitis and abdominal pain.  ED visit 09/07/23 for abdominal pain.  Labs unremarkable other than mildly low calcium at 8.6 and total protein 6.1.  UA normal.  Normal CBC and lipase.  He was treated with Dilaudid, Zofran, and IV fluids   CT A/P 09/07/23: -No pancreatic or hepatobiliary abnormality -Spleen unremarkable -Mural thickening and mucosal hyperenhancement noted extending from the level of the rectum approximately to the level of the mid transverse colon concerning for colitis (advised to correlate for signs and symptoms of ulcerative colitis) -No other abnormality noted  Initial office visit 09/09/2023. Advised to check CRP, HSV, fecal calprotectin, and stool studies.  Given dicyclomine as needed for abdominal pain.  Potentially give antibiotics if stool studies positive.  Advised to schedule colonoscopy for 3-4 weeks with Dr. Tasia Catchings.  Given education on colitis and ulcerative colitis.  Provided work note for patient.  Follow-up 8 weeks  Previously ordered outpatient labs and stool studies not performed.  ED visit 10/5 and 10/6 for abdominal pain. *** Patient discharged with Augmentin and prednisone.  Lab Results  Component Value Date   WBC 6.1 09/21/2023   HGB 15.5 09/21/2023   HCT 47.6 09/21/2023   MCV 87.3 09/21/2023   PLT 259 09/21/2023      Latest Ref Rng & Units 09/21/2023    1:05 PM 09/07/2023    8:57 AM 09/03/2023   12:43 PM  Hepatic Function  Total Protein 6.5 - 8.1 g/dL 6.5  6.1  7.0   Albumin 3.5 - 5.0 g/dL 4.5   4.0  4.5   AST 15 - 41 U/L 16  17  18    ALT 0 - 44 U/L 27  26  29    Alk Phosphatase 38 - 126 U/L 101  88  95   Total Bilirubin 0.3 - 1.2 mg/dL 1.1  0.9  1.5    Today:  Having pain in his abdomen and then reports abdominal pain radiating to the back. At one time pain radiating to his right arm. Sometimes having ankle pain.   Start antibiotics yesterday and steroids. Back pain comes and goes but not as severe as it was.   Had BM yesterday. Staying away from sodas. Only drinking water and Gatorades. Has been trying to eat better as well. Mostly on days that he goes to work he gets nauseas and only had vomiting episode.   BM every 1-2 days then gets the urge to go. Sometimes he has to eat veggies and then that helps him go.   Did have a little alcohol Friday evening but not much and didn't feel bad.    Current Outpatient Medications  Medication Sig Dispense Refill   amoxicillin-clavulanate (AUGMENTIN) 875-125 MG tablet Take 1 tablet by mouth every 12 (twelve) hours. 14 tablet 0   dicyclomine (BENTYL) 10 MG capsule Take 1 capsule (10 mg total) by mouth 2 (two) times daily as needed for spasms (abdominal pain). 30 capsule 1   methocarbamol (ROBAXIN) 500 MG tablet  Take 1 tablet (500 mg total) by mouth 2 (two) times daily. 20 tablet 0   predniSONE (DELTASONE) 10 MG tablet Take 2 tablets (20 mg total) by mouth daily for 4 days. 8 tablet 0   lidocaine 4 % Place 1 patch onto the skin daily. (Patient not taking: Reported on 09/23/2023) 15 patch 0   No current facility-administered medications for this visit.    Past Medical History:  Diagnosis Date   ADHD (attention deficit hyperactivity disorder)    Asthma     Past Surgical History:  Procedure Laterality Date   head surgery (as a baby)      Family History  Problem Relation Age of Onset   Healthy Mother    Healthy Father    Cancer Maternal Uncle    Heart disease Maternal Uncle    Colon cancer Neg Hx    Colon polyps Neg Hx      Allergies as of 09/23/2023 - Review Complete 09/23/2023  Allergen Reaction Noted   Bee venom Anaphylaxis 08/31/2012    Social History   Socioeconomic History   Marital status: Single    Spouse name: Not on file   Number of children: Not on file   Years of education: Not on file   Highest education level: Not on file  Occupational History   Not on file  Tobacco Use   Smoking status: Every Day    Current packs/day: 0.50    Types: Cigarettes   Smokeless tobacco: Never  Vaping Use   Vaping status: Never Used  Substance and Sexual Activity   Alcohol use: Yes    Comment: occasional   Drug use: Yes    Types: Cocaine, Marijuana    Comment: previous cocaine but current marijauna last week   Sexual activity: Yes  Other Topics Concern   Not on file  Social History Narrative   Not on file   Social Determinants of Health   Financial Resource Strain: Not on file  Food Insecurity: Not on file  Transportation Needs: No Transportation Needs (06/03/2023)   PRAPARE - Administrator, Civil Service (Medical): No    Lack of Transportation (Non-Medical): No  Physical Activity: Not on file  Stress: Not on file  Social Connections: Not on file     Review of Systems   Gen: Denies fever, chills, anorexia. Denies fatigue, weakness, weight loss.  CV: Denies chest pain, palpitations, syncope, peripheral edema, and claudication. Resp: Denies dyspnea at rest, cough, wheezing, coughing up blood, and pleurisy. GI: See HPI Derm: Denies rash, itching, dry skin Psych: Denies depression, anxiety, memory loss, confusion. No homicidal or suicidal ideation.  Heme: Denies bruising, bleeding, and enlarged lymph nodes.   Physical Exam   BP 125/86   Pulse 65   Temp 98 F (36.7 C)   Ht 5\' 11"  (1.803 m)   Wt 190 lb 3.2 oz (86.3 kg)   BMI 26.53 kg/m   General:   Alert and oriented. No distress noted. Pleasant and cooperative.  Head:  Normocephalic and atraumatic. Eyes:   Conjuctiva clear without scleral icterus. Mouth:  Oral mucosa pink and moist. Good dentition. No lesions. Lungs:  Clear to auscultation bilaterally. No wheezes, rales, or rhonchi. No distress.  Heart:  S1, S2 present without murmurs appreciated.  Abdomen:  +BS, soft, non-tender and non-distended. No rebound or guarding. No HSM or masses noted. Rectal: *** Msk:  Symmetrical without gross deformities. Normal posture. Extremities:  Without edema. Neurologic:  Alert and  oriented  x4 Psych:  Alert and cooperative. Normal mood and affect.   Assessment  [name] is a 42 y.o. male with a history of *** presenting today with   Colitis, abdominal pain:   PLAN   *** Pantoprazole 40 mg once daily Start fiber supplement daily.  Dicyclomine 10 mg TID as needed Advised that if he gets his FMLA paperwork we can fill it out.  Continue antibiotics and steroids for now Check CRP, stool studies, iron panel, fecal calprotectin in 2 weeks.  Colonoscopy once schedule for November (first week if able)   Brooke Bonito, MSN, FNP-BC, AGACNP-BC [facility]

## 2023-09-23 NOTE — Patient Instructions (Addendum)
Please complete your labs 10/21 or after.  I am sending in pantoprazole for you to take 40 mg once daily in the mornings prior to breakfast.  You may need to talk with work about staying away from areas with strong smells to prevent nausea.  Continue dicyclomine 10 mg as needed.  I increased this for you to have up to 3 times a day versus twice a day.  I want you to get a fiber supplement over-the-counter such as Benefiber or Metamucil.  You can get the fiber powder or Gummies whenever you prefer.  Continue your Augmentin/amoxicillin for the whole 7 days.  Please also continue your full 4 days of prednisone.  Please talk to your nurse at work and HR and if they give you FMLA paperwork then we can fill that out.  As soon as we get our November schedule out for colonoscopy we will get you scheduled.  Take your methocarbamol/Robaxin as needed for your back pain.  Lets plan to follow-up in 8 weeks, sooner if needed.  It was a pleasure to see you today. I want to create trusting relationships with patients. If you receive a survey regarding your visit,  I greatly appreciate you taking time to fill this out on paper or through your MyChart. I value your feedback.  Brooke Bonito, MSN, FNP-BC, AGACNP-BC [facility]

## 2023-09-23 NOTE — H&P (View-Only) (Signed)
GI Office Note    Referring Provider: No ref. provider found Primary Care Physician:  Pcp, No Primary Gastroenterologist: Dr. Tasia Catchings  Date:  09/23/2023  ID:  [name], DOB Apr 27, 1981, MRN [id number]   Chief Complaint   Chief Complaint  Patient presents with   Follow-up    Pt wants his procedure move up sooner   History of Present Illness  [name] is a 42 y.o. male with a history of asthma and ADHD presenting today for follow-up of colitis and abdominal pain.  ED visit 09/07/23 for abdominal pain.  Labs unremarkable other than mildly low calcium at 8.6 and total protein 6.1.  UA normal.  Normal CBC and lipase.  He was treated with Dilaudid, Zofran, and IV fluids   CT A/P 09/07/23: -No pancreatic or hepatobiliary abnormality -Spleen unremarkable -Mural thickening and mucosal hyperenhancement noted extending from the level of the rectum approximately to the level of the mid transverse colon concerning for colitis (advised to correlate for signs and symptoms of ulcerative colitis) -No other abnormality noted  Initial office visit 09/09/2023. Advised to check CRP, HSV, fecal calprotectin, and stool studies.  Given dicyclomine as needed for abdominal pain.  Potentially give antibiotics if stool studies positive.  Advised to schedule colonoscopy for 3-4 weeks with Dr. Tasia Catchings.  Given education on colitis and ulcerative colitis.  Provided work note for patient.  Follow-up 8 weeks  Previously ordered outpatient labs and stool studies not performed.  ED visit 10/5 and 10/6 for abdominal pain.  Also complaining of back pain.  Received Toradol injection and lidocaine patch. Patient discharged with Augmentin and prednisone on 10/5 despite no evidence of fever just in case this is infectious process.  Lab Results  Component Value Date   WBC 6.1 09/21/2023   HGB 15.5 09/21/2023   HCT 47.6 09/21/2023   MCV 87.3 09/21/2023   PLT 259 09/21/2023      Latest Ref Rng & Units 09/21/2023     1:05 PM 09/07/2023    8:57 AM 09/03/2023   12:43 PM  Hepatic Function  Total Protein 6.5 - 8.1 g/dL 6.5  6.1  7.0   Albumin 3.5 - 5.0 g/dL 4.5  4.0  4.5   AST 15 - 41 U/L 16  17  18    ALT 0 - 44 U/L 27  26  29    Alk Phosphatase 38 - 126 U/L 101  88  95   Total Bilirubin 0.3 - 1.2 mg/dL 1.1  0.9  1.5    Today:  Having pain in his abdomen and then reports abdominal pain radiating to the back. At one time pain radiating to his right arm. Sometimes having ankle pain.   Start antibiotics yesterday and steroids. Back pain comes and goes but not as severe as it was.   Had BM yesterday. Staying away from sodas. Only drinking water and Gatorades. Has been trying to eat better as well. Mostly on days that he goes to work he gets nauseas and only had vomiting episode.   BM every 1-2 days then gets the urge to go. Sometimes he has to eat veggies and then that helps him go.   Did have a little alcohol Friday evening but not much and didn't feel bad.    Current Outpatient Medications  Medication Sig Dispense Refill   amoxicillin-clavulanate (AUGMENTIN) 875-125 MG tablet Take 1 tablet by mouth every 12 (twelve) hours. 14 tablet 0   dicyclomine (BENTYL) 10 MG capsule Take 1  capsule (10 mg total) by mouth 2 (two) times daily as needed for spasms (abdominal pain). 30 capsule 1   methocarbamol (ROBAXIN) 500 MG tablet Take 1 tablet (500 mg total) by mouth 2 (two) times daily. 20 tablet 0   predniSONE (DELTASONE) 10 MG tablet Take 2 tablets (20 mg total) by mouth daily for 4 days. 8 tablet 0   lidocaine 4 % Place 1 patch onto the skin daily. (Patient not taking: Reported on 09/23/2023) 15 patch 0   No current facility-administered medications for this visit.    Past Medical History:  Diagnosis Date   ADHD (attention deficit hyperactivity disorder)    Asthma     Past Surgical History:  Procedure Laterality Date   head surgery (as a baby)      Family History  Problem Relation Age of Onset    Healthy Mother    Healthy Father    Cancer Maternal Uncle    Heart disease Maternal Uncle    Colon cancer Neg Hx    Colon polyps Neg Hx     Allergies as of 09/23/2023 - Review Complete 09/23/2023  Allergen Reaction Noted   Bee venom Anaphylaxis 08/31/2012    Social History   Socioeconomic History   Marital status: Single    Spouse name: Not on file   Number of children: Not on file   Years of education: Not on file   Highest education level: Not on file  Occupational History   Not on file  Tobacco Use   Smoking status: Every Day    Current packs/day: 0.50    Types: Cigarettes   Smokeless tobacco: Never  Vaping Use   Vaping status: Never Used  Substance and Sexual Activity   Alcohol use: Yes    Comment: occasional   Drug use: Yes    Types: Cocaine, Marijuana    Comment: previous cocaine but current marijauna last week   Sexual activity: Yes  Other Topics Concern   Not on file  Social History Narrative   Not on file   Social Determinants of Health   Financial Resource Strain: Not on file  Food Insecurity: Not on file  Transportation Needs: No Transportation Needs (06/03/2023)   PRAPARE - Administrator, Civil Service (Medical): No    Lack of Transportation (Non-Medical): No  Physical Activity: Not on file  Stress: Not on file  Social Connections: Not on file   Review of Systems   Gen: Denies fever, chills, anorexia. Denies fatigue, weakness, weight loss.  CV: Denies chest pain, palpitations, syncope, peripheral edema, and claudication. Resp: Denies dyspnea at rest, cough, wheezing, coughing up blood, and pleurisy. GI: See HPI Derm: Denies rash, itching, dry skin Psych: Denies depression, anxiety, memory loss, confusion. No homicidal or suicidal ideation.  Heme: Denies bruising, bleeding, and enlarged lymph nodes.  Physical Exam   BP 125/86   Pulse 65   Temp 98 F (36.7 C)   Ht 5\' 11"  (1.803 m)   Wt 190 lb 3.2 oz (86.3 kg)   BMI 26.53  kg/m   General:   Alert and oriented. No distress noted. Pleasant and cooperative.  Head:  Normocephalic and atraumatic. Eyes:  Conjuctiva clear without scleral icterus. Mouth:  Oral mucosa pink and moist. Good dentition. No lesions. Lungs:  Clear to auscultation bilaterally. No wheezes, rales, or rhonchi. No distress.  Heart:  S1, S2 present without murmurs appreciated.  Abdomen:  +BS, soft, non-distended.  TTP to LLQ and LUQ.  No  rebound or guarding. No HSM or masses noted. Rectal: Deferred Msk:  Symmetrical without gross deformities. Normal posture. Extremities:  Without edema. Neurologic:  Alert and  oriented x4 Psych:  Alert and cooperative. Normal mood and affect.   Assessment  [name] is a 42 y.o. male with a history of asthma and ADHD  presenting today for ED follow-up of abdominal pain and ongoing colitis.  Colitis, abdominal pain: Multiple ED visits since 9/17 for abdominal pain.  CT 9/21 with extensive colitis extending from the rectum to the mid transverse colon it appears to be progressive in nature concerning for possible IBD.  Does have history of intermittent rectal bleeding but has attributed to history of hemorrhoids.  At time of last visit abdominal pain had improved and was not having any diarrhea.  He has had recurrence of abdominal pain and he was previously given Bentyl to help with symptoms.  This past weekend he began having severe refractory abdominal pain which prompted his ED visits.  No repeat imaging was performed and he was given Augmentin for treatment of potential infectious colitis.  Plans for colonoscopy at the end of this month/early November to reduce risk of perforation.  ED also gave him a short course of steroids.  Stool studies not recently performed although previously recommended.  Encouraged patient to complete CRP, fecal Cal, iron studies, and stool studies.  Since he has been on steroids and antibiotics advised to continue for now.  Continue  dicyclomine as needed will increase up to 3 times a day.  Discussed avoiding pungent smells to prevent ongoing nausea and abdominal pain.  Continue PPI and was advised to start fiber supplementation given some intermittent constipation.  Will provide work note.  Provided patient repeat lab slips today.   PLAN   Pantoprazole 40 mg once daily Start fiber supplement daily.  Dicyclomine 10 mg TID as needed Advised that if he gets his FMLA paperwork we can fill it out.  Continue antibiotics and steroids for now Check CRP, stool studies, iron panel, fecal calprotectin in 2 weeks.  Colonoscopy once schedule for November (first week if able)   Brooke Bonito, MSN, FNP-BC, AGACNP-BC [facility]

## 2023-09-27 NOTE — Telephone Encounter (Signed)
Attempted to call pt, vm not set up, unable to leave message.

## 2023-10-02 ENCOUNTER — Telehealth: Payer: Self-pay | Admitting: Internal Medicine

## 2023-10-02 NOTE — Telephone Encounter (Signed)
Called pt, LMOVM to schedule TCS with Dr. Tasia Catchings, ASA 2

## 2023-10-02 NOTE — Telephone Encounter (Signed)
Patient came into the office saying that he wanted to schedule his colonoscopy for Oct. 28 if the date was still available.

## 2023-10-03 NOTE — Telephone Encounter (Signed)
SPOKE WITH PT. He is scheduled for 11/4 at 2pm. Aware will leave sample/instructions for pick up at front desk.

## 2023-10-08 DIAGNOSIS — R1110 Vomiting, unspecified: Secondary | ICD-10-CM | POA: Diagnosis not present

## 2023-10-08 DIAGNOSIS — K529 Noninfective gastroenteritis and colitis, unspecified: Secondary | ICD-10-CM | POA: Diagnosis not present

## 2023-10-08 DIAGNOSIS — R109 Unspecified abdominal pain: Secondary | ICD-10-CM | POA: Diagnosis not present

## 2023-10-09 LAB — IRON,TIBC AND FERRITIN PANEL
Ferritin: 123 ng/mL (ref 30–400)
Iron Saturation: 29 % (ref 15–55)
Iron: 106 ug/dL (ref 38–169)
Total Iron Binding Capacity: 369 ug/dL (ref 250–450)
UIBC: 263 ug/dL (ref 111–343)

## 2023-10-09 LAB — C-REACTIVE PROTEIN: CRP: <1 mg/L (ref 0–10)

## 2023-10-09 LAB — HSV 1 ANTIBODY, IGG: HSV 1 Glycoprotein G Ab, IgG: REACTIVE — AB

## 2023-10-15 NOTE — Telephone Encounter (Signed)
LMOVM to return call. Pt is still on Schedule for 10/21/23. Needs to come by office to pick up prep.

## 2023-10-15 NOTE — Telephone Encounter (Signed)
Pt informed still on schedule for 10/21/23. Informed to come by office to pick up prep. States he will come by today.

## 2023-10-21 ENCOUNTER — Encounter (HOSPITAL_COMMUNITY)
Admission: RE | Disposition: A | Discharge status: Home / Self Care | Payer: Self-pay | Source: Home / Self Care | Attending: Gastroenterology

## 2023-10-21 ENCOUNTER — Ambulatory Visit (HOSPITAL_COMMUNITY)
Admission: RE | Admit: 2023-10-21 | Discharge: 2023-10-21 | Disposition: A | Discharge status: Home / Self Care | Payer: Medicaid Other | Attending: Gastroenterology | Admitting: Gastroenterology

## 2023-10-21 ENCOUNTER — Telehealth (INDEPENDENT_AMBULATORY_CARE_PROVIDER_SITE_OTHER): Payer: Self-pay | Admitting: *Deleted

## 2023-10-21 ENCOUNTER — Encounter (HOSPITAL_COMMUNITY): Payer: Self-pay

## 2023-10-21 ENCOUNTER — Ambulatory Visit (HOSPITAL_COMMUNITY): Payer: Medicaid Other | Admitting: Anesthesiology

## 2023-10-21 ENCOUNTER — Ambulatory Visit (HOSPITAL_BASED_OUTPATIENT_CLINIC_OR_DEPARTMENT_OTHER): Payer: Medicaid Other | Admitting: Anesthesiology

## 2023-10-21 ENCOUNTER — Other Ambulatory Visit: Payer: Self-pay

## 2023-10-21 ENCOUNTER — Encounter (INDEPENDENT_AMBULATORY_CARE_PROVIDER_SITE_OTHER): Payer: Self-pay | Admitting: *Deleted

## 2023-10-21 DIAGNOSIS — K529 Noninfective gastroenteritis and colitis, unspecified: Secondary | ICD-10-CM | POA: Diagnosis not present

## 2023-10-21 DIAGNOSIS — F17210 Nicotine dependence, cigarettes, uncomplicated: Secondary | ICD-10-CM | POA: Diagnosis not present

## 2023-10-21 DIAGNOSIS — K5900 Constipation, unspecified: Secondary | ICD-10-CM | POA: Diagnosis not present

## 2023-10-21 DIAGNOSIS — K644 Residual hemorrhoidal skin tags: Secondary | ICD-10-CM | POA: Diagnosis not present

## 2023-10-21 DIAGNOSIS — K625 Hemorrhage of anus and rectum: Secondary | ICD-10-CM | POA: Diagnosis not present

## 2023-10-21 DIAGNOSIS — R933 Abnormal findings on diagnostic imaging of other parts of digestive tract: Secondary | ICD-10-CM | POA: Insufficient documentation

## 2023-10-21 DIAGNOSIS — K921 Melena: Secondary | ICD-10-CM | POA: Diagnosis not present

## 2023-10-21 DIAGNOSIS — K648 Other hemorrhoids: Secondary | ICD-10-CM | POA: Diagnosis not present

## 2023-10-21 DIAGNOSIS — F17200 Nicotine dependence, unspecified, uncomplicated: Secondary | ICD-10-CM | POA: Diagnosis not present

## 2023-10-21 DIAGNOSIS — R109 Unspecified abdominal pain: Secondary | ICD-10-CM | POA: Diagnosis not present

## 2023-10-21 DIAGNOSIS — K649 Unspecified hemorrhoids: Secondary | ICD-10-CM | POA: Diagnosis not present

## 2023-10-21 DIAGNOSIS — K6389 Other specified diseases of intestine: Secondary | ICD-10-CM

## 2023-10-21 DIAGNOSIS — J45909 Unspecified asthma, uncomplicated: Secondary | ICD-10-CM | POA: Diagnosis not present

## 2023-10-21 HISTORY — PX: COLONOSCOPY WITH PROPOFOL: SHX5780

## 2023-10-21 HISTORY — PX: BIOPSY: SHX5522

## 2023-10-21 LAB — HM COLONOSCOPY

## 2023-10-21 SURGERY — COLONOSCOPY WITH PROPOFOL
Anesthesia: General

## 2023-10-21 MED ORDER — DEXMEDETOMIDINE HCL IN NACL 80 MCG/20ML IV SOLN
INTRAVENOUS | Status: DC | PRN
  Administered 2023-10-21: 8 ug via INTRAVENOUS

## 2023-10-21 MED ORDER — LACTATED RINGERS IV SOLN: INTRAVENOUS | Status: DC | PRN

## 2023-10-21 MED ORDER — LIDOCAINE HCL (CARDIAC) PF 100 MG/5ML IV SOSY
PREFILLED_SYRINGE | INTRAVENOUS | Status: DC | PRN
  Administered 2023-10-21: 50 mg via INTRAVENOUS

## 2023-10-21 MED ORDER — PROPOFOL 500 MG/50ML IV EMUL
INTRAVENOUS | Status: DC | PRN
  Administered 2023-10-21: 150 ug/kg/min via INTRAVENOUS

## 2023-10-21 MED ORDER — SODIUM CHLORIDE 0.9% FLUSH: 10.0000 mL | Freq: Two times a day (BID) | INTRAVENOUS | Status: DC

## 2023-10-21 MED ORDER — DEXMEDETOMIDINE HCL IN NACL 80 MCG/20ML IV SOLN
INTRAVENOUS | Status: AC
  Filled 2023-10-21: qty 20

## 2023-10-21 MED ORDER — PROPOFOL 10 MG/ML IV BOLUS
INTRAVENOUS | Status: DC | PRN
  Administered 2023-10-21: 200 mg via INTRAVENOUS
  Administered 2023-10-21: 20 mg via INTRAVENOUS

## 2023-10-21 NOTE — Telephone Encounter (Signed)
Per TCS op note - Repeat colonoscopy at next available appointment (within 3 months) because the bowel reparation was  poor

## 2023-10-21 NOTE — Anesthesia Procedure Notes (Signed)
Date/Time: 10/21/2023 1:18 PM  Performed by: Franco Nones, CRNAPre-anesthesia Checklist: Patient identified, Emergency Drugs available, Suction available, Timeout performed and Patient being monitored Patient Re-evaluated:Patient Re-evaluated prior to induction Oxygen Delivery Method: Nasal Cannula

## 2023-10-21 NOTE — Op Note (Signed)
[facility] Patient Name: [name] Procedure Date: 10/21/2023 12:00 PM MRN: [id number] Date of Birth: [birth year] Attending MD: Sanjuan Dame , MD, [id number] CSN: [id number] Age: 42 Admit Type: Outpatient Procedure:                Colonoscopy Indications:              Hematochezia, Abnormal CT of the GI tract Providers:                Sanjuan Dame, MD, Sheran Fava, Zena Amos Referring MD:              Medicines:                Monitored Anesthesia Care Complications:            No immediate complications. Estimated Blood Loss:     Estimated blood loss was minimal. Procedure:                Pre-Anesthesia Assessment:                           - Prior to the procedure, a History and Physical                            was performed, and patient medications and                            allergies were reviewed. The patient's tolerance of                            previous anesthesia was also reviewed. The risks                            and benefits of the procedure and the sedation                            options and risks were discussed with the patient.                            All questions were answered, and informed consent                            was obtained. Prior Anticoagulants: The patient has                            taken no anticoagulant or antiplatelet agents. ASA                            Grade Assessment: II - A patient with mild systemic                            disease. After reviewing the risks and benefits,  the patient was deemed in satisfactory condition to                            undergo the procedure.                           After obtaining informed consent, the colonoscope                            was passed under direct vision. Throughout the                            procedure, the patient's blood pressure, pulse, and                            oxygen  saturations were monitored continuously. The                            [id number] scope was introduced through the                            anus and advanced to the the cecum, identified by                            the appendiceal orifice. The colonoscopy was                            somewhat difficult due to poor bowel prep with                            stool present. Successful completion of the                            procedure was aided by lavage. The patient                            tolerated the procedure well. The quality of the                            bowel preparation was evaluated using the BBPS                            [facility] Bowel Preparation Scale) with scores of:                            Right Colon = 1 (portion of mucosa seen, but other                            areas not well seen due to staining, residual stool                            and/or opaque liquid), Transverse Colon = 2 (minor  amount of residual staining, small fragments of                            stool and/or opaque liquid, but mucosa seen well)                            and Left Colon = 2 (minor amount of residual                            staining, small fragments of stool and/or opaque                            liquid, but mucosa seen well). The total BBPS score                            equals 5. The quality of the bowel preparation was                            inadequate. The appendiceal orifice and the rectum                            were photographed. Scope In: 1:24:37 PM Scope Out: 1:37:11 PM Scope Withdrawal Time: 0 hours 7 minutes 29 seconds  Total Procedure Duration: 0 hours 12 minutes 34 seconds  Findings:      The perianal and digital rectal examinations were normal.      A large amount of stool was found in the entire colon, precluding       visualization. Lavage of the area was performed using a large amount of       tap  water, resulting in incomplete clearance with continued poor       visualization.      Non-bleeding external and internal hemorrhoids were found during       retroflexion. The hemorrhoids were small.      There is no endoscopic evidence of erythema, inflammation or ulcerations       in the rectum, in the descending colon and in the transverse colon.       Multiple biopsies were obtained in the rectum, in the sigmoid colon and       in the descending colon with cold forceps for histology. Impression:               - Preparation of the colon was inadequate. The CT                            demonstrated colitis of Descending and to rectum                            which was adeqauetly examined without any apparent                            colitis                           - Stool in the entire examined colon.                           -  Non-bleeding external and internal hemorrhoids.                           - Multiple biopsies were obtained in the rectum, in                            the sigmoid colon and in the descending colon. Moderate Sedation:      Per Anesthesia Care Recommendation:           - Patient has a contact number available for                            emergencies. The signs and symptoms of potential                            delayed complications were discussed with the                            patient. Return to normal activities tomorrow.                            Written discharge instructions were provided to the                            patient.                           - Resume previous diet.                           - Continue present medications.                           - Await pathology results.                           - Repeat colonoscopy at next available appointment                            (within 3 months) because the bowel preparation was                            poor.                           - Return to GI office as previously  scheduled. Procedure Code(s):        --- Professional ---                           [phone], Colonoscopy, flexible; with biopsy, single                            or multiple Diagnosis Code(s):        --- Professional ---                           X91.4, Other  hemorrhoids                           K92.1, Melena (includes Hematochezia)                           R93.3, Abnormal findings on diagnostic imaging of                            other parts of digestive tract CPT copyright 2022 [employer]. All rights reserved. The codes documented in this report are preliminary and upon coder review may  be revised to meet current compliance requirements. Sanjuan Dame, MD Sanjuan Dame, MD 10/21/2023 1:50:36 PM This report has been signed electronically. Number of Addenda: 0

## 2023-10-21 NOTE — Anesthesia Preprocedure Evaluation (Signed)
Anesthesia Evaluation  Patient identified by MRN, date of birth, ID band Patient awake    Reviewed: Allergy & Precautions, H&P , NPO status , Patient's Chart, lab work & pertinent test results, reviewed documented beta blocker date and time   Airway Mallampati: II  TM Distance: >3 FB Neck ROM: full    Dental no notable dental hx.    Pulmonary asthma , Current Smoker and Patient abstained from smoking.   Pulmonary exam normal breath sounds clear to auscultation       Cardiovascular Exercise Tolerance: Good negative cardio ROS  Rhythm:regular Rate:Normal     Neuro/Psych  PSYCHIATRIC DISORDERS      negative neurological ROS     GI/Hepatic negative GI ROS, Neg liver ROS,,,  Endo/Other  negative endocrine ROS    Renal/GU negative Renal ROS  negative genitourinary   Musculoskeletal   Abdominal   Peds  Hematology negative hematology ROS (+)   Anesthesia Other Findings   Reproductive/Obstetrics negative OB ROS                             Anesthesia Physical Anesthesia Plan  ASA: 2  Anesthesia Plan: General   Post-op Pain Management:    Induction:   PONV Risk Score and Plan: Propofol infusion  Airway Management Planned:   Additional Equipment:   Intra-op Plan:   Post-operative Plan:   Informed Consent: I have reviewed the patients History and Physical, chart, labs and discussed the procedure including the risks, benefits and alternatives for the proposed anesthesia with the patient or authorized representative who has indicated his/her understanding and acceptance.     Dental Advisory Given  Plan Discussed with: CRNA  Anesthesia Plan Comments:        Anesthesia Quick Evaluation

## 2023-10-21 NOTE — Discharge Instructions (Signed)

## 2023-10-21 NOTE — Transfer of Care (Signed)
Immediate Anesthesia Transfer of Care Note  Patient: [name]  Procedure(s) Performed: COLONOSCOPY WITH PROPOFOL BIOPSY  Patient Location: Endoscopy Unit  Anesthesia Type:General  Level of Consciousness: drowsy and patient cooperative  Airway & Oxygen Therapy: Patient Spontanous Breathing  Post-op Assessment: Report given to RN and Post -op Vital signs reviewed and stable  Post vital signs: Reviewed and stable  Last Vitals:  Vitals Value Taken Time  BP 96/53 10/21/23 1342  Temp 36.5 C 10/21/23 1342  Pulse 59 10/21/23 1342  Resp 19 10/21/23 1342  SpO2 97 % 10/21/23 1342    Last Pain:  Vitals:   10/21/23 1342  TempSrc: Oral  PainSc: 0-No pain      Patients Stated Pain Goal: 8 (10/21/23 1240)  Complications: No notable events documented.

## 2023-10-21 NOTE — Interval H&P Note (Signed)
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  The various methods of treatment have been discussed with the patient and family. After consideration of risks, benefits and other options for treatment, the patient has consented to  Procedure(s) with comments: COLONOSCOPY WITH PROPOFOL (N/A) - 200pm, asa 2 as a surgical intervention.  The patient's history has been reviewed, patient examined, no change in status, stable for surgery.  I have reviewed the patient's chart and labs.  Questions were answered to the patient's satisfaction.     Juanetta Beets Hillarie Harrigan

## 2023-10-22 LAB — SURGICAL PATHOLOGY

## 2023-10-22 NOTE — Telephone Encounter (Signed)
3 months will be Feb 25. Will put in folder

## 2023-10-23 NOTE — Progress Notes (Signed)
I reviewed the pathology results. [name], can you send her a letter with the findings as described below please?  Repeat colonoscopy in 2 months  Thanks,  [name], MD Gastroenterology and Hepatology [facility]  ---------------------------------------------------------------------------------------------  [facility] [address], [address], [place], [place] [address] Phone:  [phone]   10/23/23 [place], [place]   Dear [name],  I am writing to inform you that the biopsies taken during your recent endoscopic examination showed:  Normal left sided colon biopsies and negative for any evidence of inflammatory bowel disease.  This is a good news  Although as discussed earlier the right side was not pain and I recommend repeat colonoscopy with extended bowel prep (2-day prep) to be done in the next 2 months  Please call us at [phone] if you have persistent problems or have questions about your condition that have not been fully answered at this time.  Sincerely,  [name], MD Gastroenterology and Hepatology

## 2023-10-24 ENCOUNTER — Encounter (HOSPITAL_COMMUNITY): Payer: Self-pay | Admitting: Gastroenterology

## 2023-10-24 ENCOUNTER — Encounter (INDEPENDENT_AMBULATORY_CARE_PROVIDER_SITE_OTHER): Payer: Self-pay | Admitting: *Deleted

## 2023-10-25 NOTE — Anesthesia Postprocedure Evaluation (Signed)
Anesthesia Post Note  Patient: [name]  Procedure(s) Performed: COLONOSCOPY WITH PROPOFOL BIOPSY  Patient location during evaluation: Phase II Anesthesia Type: General Level of consciousness: awake Pain management: pain level controlled Vital Signs Assessment: post-procedure vital signs reviewed and stable Respiratory status: spontaneous breathing and respiratory function stable Cardiovascular status: blood pressure returned to baseline and stable Postop Assessment: no headache and no apparent nausea or vomiting Anesthetic complications: no Comments: Late entry   No notable events documented.   Last Vitals:  Vitals:   10/21/23 1240 10/21/23 1342  BP: (!) 151/94 (!) 96/53  Pulse: 62 (!) 59  Resp: 16 19  Temp: 36.7 C 36.5 C  SpO2: 100% 97%    Last Pain:  Vitals:   10/21/23 1342  TempSrc: Oral  PainSc: 0-No pain                 [name]

## 2023-11-04 ENCOUNTER — Encounter: Payer: Self-pay | Admitting: Gastroenterology

## 2023-11-04 ENCOUNTER — Ambulatory Visit (INDEPENDENT_AMBULATORY_CARE_PROVIDER_SITE_OTHER): Payer: Medicaid Other | Admitting: Gastroenterology

## 2023-11-04 VITALS — BP 145/96 | HR 65 | Temp 97.8°F | Ht 71.0 in | Wt 189.8 lb

## 2023-11-04 DIAGNOSIS — K219 Gastro-esophageal reflux disease without esophagitis: Secondary | ICD-10-CM

## 2023-11-04 DIAGNOSIS — K5900 Constipation, unspecified: Secondary | ICD-10-CM | POA: Diagnosis not present

## 2023-11-04 DIAGNOSIS — K529 Noninfective gastroenteritis and colitis, unspecified: Secondary | ICD-10-CM

## 2023-11-04 DIAGNOSIS — R112 Nausea with vomiting, unspecified: Secondary | ICD-10-CM | POA: Diagnosis not present

## 2023-11-04 DIAGNOSIS — K6289 Other specified diseases of anus and rectum: Secondary | ICD-10-CM

## 2023-11-04 DIAGNOSIS — L290 Pruritus ani: Secondary | ICD-10-CM | POA: Diagnosis not present

## 2023-11-04 NOTE — Patient Instructions (Addendum)
For reflux: You may take Tums as needed, follow directions on the bottle. Follow a GERD diet:  Avoid fried, fatty, greasy, spicy, citrus foods. Avoid caffeine and carbonated beverages. Avoid chocolate. Try eating 4-6 small meals a day rather than 3 large meals. Do not eat within 3 hours of laying down. Prop head of bed up on wood or bricks to create a 6 inch incline.  For constipation: Continue high-fiber diet Increase MiraLAX to 17 g (1 capful) twice daily in 8 ounces of water. Other good treatment options for constipation include warm prune juice or warm apple juice.  You can also eat prunes or also take a daily magnesium oxide supplement.  For rectal pain and itching: I am sending in Anusol for you to apply this internally twice a day for 1 week then you can use it as needed. For occasional rectal pain you can also obtain some over-the-counter rectal lidocaine to apply with your Anusol if needed. Avoid straining with bowel movements Limit toilet time to less than 5 minutes each visit. Please look over the hemorrhoid banding pamphlet, if this is something you would like to proceed with then please let me know.  I have attached some information to your paperwork today regarding rectal pain, hemorrhoids, and constipation.  This list potential causes as well as good resources to help with management and further clarify some of the treatment options we are currently pursuing.  We will plan to follow-up in 2 months, sooner if needed.  It was a pleasure to see you today. I want to create trusting relationships with patients. If you receive a survey regarding your visit,  I greatly appreciate you taking time to fill this out on paper or through your MyChart. I value your feedback.  Brooke Bonito, MSN, FNP-BC, AGACNP-BC [facility]

## 2023-11-04 NOTE — Progress Notes (Signed)
GI Office Note    Referring Provider: No ref. provider found Primary Care Physician:  Pcp, No Primary Gastroenterologist: [name], MD  Date:  11/04/2023  ID:  [name], DOB 1981-01-13, MRN [id number]  Chief Complaint   Chief Complaint  Patient presents with   Follow-up    Patient here today for a follow up on colitis. He says he has not really had a good bm since his procedure 10/21/2023. He says he has pain around his rectal area and he has some itching. Denies any dark or bloody stools.    History of Present Illness  [name] is a 42 y.o. male with a history of asthma, ADHD, and colitis of unknown etiology presenting today for follow-up with complaint of rectal pain/itching and mild constipation.  ED visit 09/07/23 for abdominal pain.  Labs unremarkable other than mildly low calcium at 8.6 and total protein 6.1.  UA normal.  Normal CBC and lipase.  He was treated with Dilaudid, Zofran, and IV fluids   CT A/P 09/07/23: -No pancreatic or hepatobiliary abnormality -Spleen unremarkable -Mural thickening and mucosal hyperenhancement noted extending from the level of the rectum approximately to the level of the mid transverse colon concerning for colitis (advised to correlate for signs and symptoms of ulcerative colitis) -No other abnormality noted   Initial office visit 09/09/2023.  Denied any nausea or vomiting but did report urinary urgency but not with bowel movements.  Sometimes has a bowel movement twice a day, sometimes skips a day.  Denied any incontinence.  Abdominal pain worsens with eating, usually in the mornings.  Reportedly very active in the gym but reported weight loss recently.  No fevers.  Had had some rectal bleeding a few months prior and does report a history of hemorrhoids. Advised to check CRP, HSV, fecal calprotectin, and stool studies.  Given dicyclomine as needed for abdominal pain.  Potentially give antibiotics if stool studies positive.   Advised to schedule colonoscopy for 3-4 weeks with Dr. Tasia Catchings.  Given education on colitis and ulcerative colitis.  Provided work note for patient.  Follow-up 8 weeks  ED visit 10/5 and 10/6 for abdominal pain.  Also complaining of back pain.  Received Toradol injection and lidocaine patch. Patient discharged with Augmentin and prednisone on 10/5 despite no evidence of fever just in case this is infectious process.  Labs with normal LFTs.  Last office visit 09/23/2023.  Reportedly having pain in his lower abdomen and sometimes radiating to his back.  At 1 time reported having pain to radiate to his right arm.  Also had some ankle pain.  Had started antibiotics and steroids the day prior, back pain coming and going.  Has been staying away from sodas, only drinking water and Gatorade and trying to eat better.  Having nausea on workdays and only had 1 episode of vomiting.  Usually having a bowel movement every 1-2 days.  Sometimes eating veggies helps him go to the bathroom.  Had not had much alcohol use recently.  Advise need to schedule colonoscopy to further evaluate CT findings.  Advised to check CRP, fecal calprotectin, iron panel, and stool studies.  Advised to continue dicyclomine 3 times daily as needed and start a daily fiber supplement.  Continue pantoprazole.  Advised him that we would fill out FMLA paperwork.  Colonoscopy 10/21/23: - Preparation of the colon was inadequate.  - The CT demonstrated colitis of Descending and to rectum which was adeqauetly examined without any apparent  colitis  - Stool in the entire examined colon.  - Non- bleeding external and internal hemorrhoids.  - Multiple biopsies were obtained in the rectum, in the sigmoid colon and in the descending colon. - Path: Colonic mucosa with focal lamina propria edema and mild reactive/reparative change, nonspecific. Negative for activity, chronicity, viral cytopathic effect, increased  intraepithelial lymphocytes, basement membrane  thickening and dysplasia.  - Repeat colonoscopy at next available appointment (within 3 months) because the bowel preparation was poor.    Today:  Trying to eat fiber salads etc and avoiding soft drinks etc.   When he does go to the bathroom his rectum hurts a for about 5 minutes. Sometimes stools are hard. No more small ball of stool. Still itching some and some pain. When he eats green vegetables he wants to go easier.   No more abdominal pain. Not taking the dicyclomine.  Stopped taking the pantoprazole. No acid reflux symptoms on a regular basis. If he eats tacos he has a mild flare. Some mild decreased appetite.   Wt Readings from Last 3 Encounters:  11/04/23 189 lb 12.8 oz (86.1 kg)  09/23/23 190 lb 3.2 oz (86.3 kg)  09/22/23 198 lb (89.8 kg)    Current Outpatient Medications  Medication Sig Dispense Refill   dicyclomine (BENTYL) 10 MG capsule Take 1 capsule (10 mg total) by mouth 3 (three) times daily as needed for spasms (abdominal pain). 30 capsule 1   lidocaine 4 % Place 1 patch onto the skin daily. 15 patch 0   methocarbamol (ROBAXIN) 500 MG tablet Take 1 tablet (500 mg total) by mouth 2 (two) times daily. 20 tablet 0   pantoprazole (PROTONIX) 40 MG tablet Take 1 tablet (40 mg total) by mouth daily. (Patient not taking: Reported on 11/04/2023) 30 tablet 2   No current facility-administered medications for this visit.    Past Medical History:  Diagnosis Date   ADHD (attention deficit hyperactivity disorder)    Asthma     Past Surgical History:  Procedure Laterality Date   BIOPSY  10/21/2023   Procedure: BIOPSY;  Surgeon: Franky Macho, MD;  Location: [facility];  Service: Endoscopy;;   COLONOSCOPY WITH PROPOFOL N/A 10/21/2023   Procedure: COLONOSCOPY WITH PROPOFOL;  Surgeon: Franky Macho, MD;  Location: [facility];  Service: Endoscopy;  Laterality: N/A;  200pm, asa 2   head surgery (as a baby)      Family History  Problem Relation Age of Onset    Healthy Mother    Healthy Father    Cancer Maternal Uncle    Heart disease Maternal Uncle    Colon cancer Neg Hx    Colon polyps Neg Hx     Allergies as of 11/04/2023 - Review Complete 11/04/2023  Allergen Reaction Noted   Bee venom Anaphylaxis 08/31/2012    Social History   Socioeconomic History   Marital status: Single    Spouse name: Not on file   Number of children: Not on file   Years of education: Not on file   Highest education level: Not on file  Occupational History   Not on file  Tobacco Use   Smoking status: Every Day    Current packs/day: 0.50    Types: Cigarettes   Smokeless tobacco: Never  Vaping Use   Vaping status: Never Used  Substance and Sexual Activity   Alcohol use: Yes    Comment: occasional   Drug use: Not Currently    Types: Cocaine, Marijuana  Comment: previous cocaine but current marijauna last week   Sexual activity: Yes  Other Topics Concern   Not on file  Social History Narrative   Not on file   Social Determinants of Health   Financial Resource Strain: Not on file  Food Insecurity: Not on file  Transportation Needs: No Transportation Needs (06/03/2023)   PRAPARE - Administrator, Civil Service (Medical): No    Lack of Transportation (Non-Medical): No  Physical Activity: Not on file  Stress: Not on file  Social Connections: Not on file     Review of Systems   Gen: Denies fever, chills, anorexia. Denies fatigue, weakness, weight loss.  CV: Denies chest pain, palpitations, syncope, peripheral edema, and claudication. Resp: Denies dyspnea at rest, cough, wheezing, coughing up blood, and pleurisy. GI: See HPI Derm: Denies rash, itching, dry skin Psych: Denies depression, anxiety, memory loss, confusion. No homicidal or suicidal ideation.  Heme: Denies bruising, bleeding, and enlarged lymph nodes.  Physical Exam   BP (!) 145/96 (BP Location: Left Arm, Patient Position: Sitting, Cuff Size: Normal)   Pulse 65    Temp 97.8 F (36.6 C) (Temporal)   Ht 5\' 11"  (1.803 m)   Wt 189 lb 12.8 oz (86.1 kg)   BMI 26.47 kg/m   General:   Alert and oriented. No distress noted. Pleasant and cooperative.  Head:  Normocephalic and atraumatic. Eyes:  Conjuctiva clear without scleral icterus. Mouth:  Oral mucosa pink and moist. Good dentition. No lesions. Lungs:  Clear to auscultation bilaterally. No wheezes, rales, or rhonchi. No distress.  Heart:  S1, S2 present without murmurs appreciated.  Abdomen:  +BS, soft, non-tender and non-distended. No rebound or guarding. No HSM or masses noted. Rectal: deferred Msk:  Symmetrical without gross deformities. Normal posture. Extremities:  Without edema. Neurologic:  Alert and  oriented x4 Psych:  Alert and cooperative. Normal mood and affect.  Assessment  [name] is a 42 y.o. male with a history of ADHD, asthma, and fairly recent colitis presenting today for follow-up post colonoscopy with complaint of constipation and rectal pain/itching.  Colitis, abdominal pain: Multiple prior ED visits for abdominal pain, evidence of extensive colitis in September of this year from the rectum to the transverse colon which was concerning for IBD.  Underwent recent colonoscopy with the send evaluation of left-side of the colon and biopsies negative for IBD.  Given poor prep, there was an adequate evaluation of the right side of the colon.  Was treated with Augmentin and steroids in early October and had complete resolution of abdominal pain and no evidence of colitis on colonoscopy.  Has not been needing dicyclomine since treatment with Augmentin and steroids.  Etiology unclear at this time however likely bacterial or viral in nature given negative biopsies and no prior medications to potentially cause of colitis.  Ischemia possible but also less likely given colonoscopy findings.  Despite resolution of abdominal pain, I continue to recommend repeating colonoscopy for evaluation of  right side of his colon to completely rule out IBD.  Pruritus ani, rectal pain: Evidence of internal and external hemorrhoids on recent colonoscopy 10/21/23.  Has some pain after bowel movements and some occasional itching.  Pain could be secondary to large/hard caliber stools and straining.  No evidence of anal fissure on colonoscopy and has not had any melena or BRBPR.  Suspect symptoms are secondary to hemorrhoids therefore we will treat accordingly with Anusol and lidocaine as needed.  Symptoms likely exacerbated by  mild constipation.  Constipation: Continues to have some mild issues with constipation since colonoscopy.  He did have a somewhat poor prep with colonoscopy therefore this will need to be augmented in the future, plans to repeat in 2-3 months.  He has been taking MiraLAX daily and eating a high-fiber diet.  Advised him to increase his MiraLAX to twice daily and continue high-fiber diet.  We discussed other over-the-counter regimens including magnesium oxide supplementation and other dietary changes including warm prune juice, eating prunes, or warm apple juice.  GERD, N/V: Previously having frequent symptoms but no longer taking pantoprazole.  He has only had rare breakthrough symptoms without medication.  Advise he could use Tums as needed.  GERD diet reinforced.  PLAN   On recall for repeat colonoscopy in 2-3 months with 2 day prep. Start miralax twice daily GERD diet, tums as needed.  Anusol 2.5% BID for 1 week then as needed. Advised rectal lidocaine as needed GERD diet, Tums as needed. Discussed over-the-counter regimens including magnesium oxide and dietary recommendations, see AVS Repeat colonoscopy in 2-3 months per Dr. Tasia Catchings Follow up in 2 months.     Brooke Bonito, MSN, FNP-BC, AGACNP-BC [facility]

## 2023-11-09 ENCOUNTER — Ambulatory Visit
Admission: EM | Admit: 2023-11-09 | Discharge: 2023-11-09 | Disposition: A | Discharge status: Home / Self Care | Payer: Medicaid Other | Attending: Nurse Practitioner | Admitting: Nurse Practitioner

## 2023-11-09 ENCOUNTER — Encounter: Payer: Self-pay | Admitting: Emergency Medicine

## 2023-11-09 DIAGNOSIS — J069 Acute upper respiratory infection, unspecified: Secondary | ICD-10-CM | POA: Diagnosis not present

## 2023-11-09 MED ORDER — PSEUDOEPH-BROMPHEN-DM 30-2-10 MG/5ML PO SYRP: 5.0000 mL | ORAL_SOLUTION | Freq: Four times a day (QID) | ORAL | 0 refills | Status: DC | PRN

## 2023-11-09 MED ORDER — CETIRIZINE HCL 10 MG PO TABS: 10.0000 mg | ORAL_TABLET | Freq: Every day | ORAL | 0 refills | Status: DC

## 2023-11-09 MED ORDER — FLUTICASONE PROPIONATE 50 MCG/ACT NA SUSP: 2.0000 | Freq: Every day | NASAL | 0 refills | Status: DC

## 2023-11-09 NOTE — Discharge Instructions (Signed)
Take medication as prescribed. Increase fluids and allow for plenty of rest. May take over-the-counter Tylenol or ibuprofen as needed for pain, fever, or general discomfort. Warm salt water gargles as needed for throat pain or discomfort. Normal saline nasal spray as needed for nasal congestion and runny nose. Recommend using a humidifier in the bedroom at nighttime during sleep or sleeping elevated on pillows while cough symptoms persist. If symptoms do not improve over the next 7 to 10 days, you may follow-up in this clinic or with your primary care physician for further evaluation. Follow-up as needed.

## 2023-11-09 NOTE — ED Triage Notes (Signed)
Head congestion since Thursday.  Nausea this morning.  States had diarrhea on Thursday but that has cleared up.

## 2023-11-09 NOTE — ED Provider Notes (Signed)
[facility]    CSN: [id number] Arrival date & time: 11/09/23  1237      History   Chief Complaint Chief Complaint  Patient presents with   head congestion    HPI [name] is a 42 y.o. male.   The history is provided by the patient.   Patient presents for complaints of nasal congestion, head congestion, and a mild cough.  Patient states that he experienced nausea this morning, also states that he had diarrhea approximately 2 days ago when his symptoms started but that has since improved.  He denies fever, chills, headache, ear pain, ear drainage, wheezing, difficulty breathing, chest pain, abdominal pain, nausea, vomiting, diarrhea, or rash.  Patient reports he has not taken any medication for his symptoms.  Patient declines COVID testing today. Past Medical History:  Diagnosis Date   ADHD (attention deficit hyperactivity disorder)    Asthma     Patient Active Problem List   Diagnosis Date Noted   Noninfectious gastroenteritis 10/21/2023   Adjustment disorder with mixed anxiety and depressed mood 04/06/2016   Attention deficit hyperactivity disorder (ADHD) 04/06/2016   Suicide ideation 04/04/2016    Past Surgical History:  Procedure Laterality Date   BIOPSY  10/21/2023   Procedure: BIOPSY;  Surgeon: Franky Macho, MD;  [facility]: [facility];  Service: Endoscopy;;   COLONOSCOPY WITH PROPOFOL N/A 10/21/2023   Procedure: COLONOSCOPY WITH PROPOFOL;  Surgeon: Franky Macho, MD;  Location: [facility];  Service: Endoscopy;  Laterality: N/A;  200pm, asa 2   head surgery (as a baby)         Home Medications    Prior to Admission medications   Medication Sig Start Date End Date Taking? Authorizing Provider  brompheniramine-pseudoephedrine-DM 30-2-10 MG/5ML syrup Take 5 mLs by mouth 4 (four) times daily as needed. 11/09/23  Yes Leath-Warren, Sadie Haber, NP  cetirizine (ZYRTEC) 10 MG tablet Take 1 tablet (10 mg total) by mouth daily. 11/09/23  Yes  Leath-Warren, Sadie Haber, NP  fluticasone (FLONASE) 50 MCG/ACT nasal spray Place 2 sprays into both nostrils daily. 11/09/23  Yes Leath-Warren, Sadie Haber, NP  dicyclomine (BENTYL) 10 MG capsule Take 1 capsule (10 mg total) by mouth 3 (three) times daily as needed for spasms (abdominal pain). 09/23/23   Aida Raider, NP  lidocaine 4 % Place 1 patch onto the skin daily. 09/22/23   Coral Spikes, DO  methocarbamol (ROBAXIN) 500 MG tablet Take 1 tablet (500 mg total) by mouth 2 (two) times daily. 09/22/23   Coral Spikes, DO    Family History Family History  Problem Relation Age of Onset   Healthy Mother    Healthy Father    Cancer Maternal Uncle    Heart disease Maternal Uncle    Colon cancer Neg Hx    Colon polyps Neg Hx     Social History Social History   Tobacco Use   Smoking status: Every Day    Current packs/day: 0.50    Types: Cigarettes   Smokeless tobacco: Never  Vaping Use   Vaping status: Never Used  Substance Use Topics   Alcohol use: Yes    Comment: occasional   Drug use: Not Currently    Types: Cocaine, Marijuana    Comment: previous cocaine but current marijauna last week     Allergies   Bee venom   Review of Systems Review of Systems Per HPI  Physical Exam Triage Vital Signs ED Triage Vitals  Encounter  Vitals Group     BP 11/09/23 1308 133/74     Systolic BP Percentile --      Diastolic BP Percentile --      Pulse Rate 11/09/23 1308 85     Resp 11/09/23 1308 18     Temp 11/09/23 1308 97.9 F (36.6 C)     Temp Source 11/09/23 1308 Oral     SpO2 11/09/23 1308 96 %     Weight --      Height --      Head Circumference --      Peak Flow --      Pain Score 11/09/23 1310 0     Pain Loc --      Pain Education --      Exclude from Growth Chart --    No data found.  Updated Vital Signs BP 133/74 (BP Location: Right Arm)   Pulse 85   Temp 97.9 F (36.6 C) (Oral)   Resp 18   SpO2 96%   Visual Acuity Right Eye Distance:   Left Eye  Distance:   Bilateral Distance:    Right Eye Near:   Left Eye Near:    Bilateral Near:     Physical Exam Vitals and nursing note reviewed.  Constitutional:      General: He is not in acute distress.    Appearance: Normal appearance.  HENT:     Head: Normocephalic.     Right Ear: Tympanic membrane, ear canal and external ear normal.     Left Ear: Tympanic membrane, ear canal and external ear normal.     Nose: Congestion present.     Right Turbinates: Enlarged and swollen.     Left Turbinates: Enlarged and swollen.     Right Sinus: No maxillary sinus tenderness or frontal sinus tenderness.     Left Sinus: No maxillary sinus tenderness or frontal sinus tenderness.     Mouth/Throat:     Lips: Pink.     Mouth: Mucous membranes are moist.     Pharynx: Oropharynx is clear. Uvula midline. Postnasal drip present.     Comments: Cobblestoning present to posterior oropharynx  Eyes:     Extraocular Movements: Extraocular movements intact.     Conjunctiva/sclera: Conjunctivae normal.     Pupils: Pupils are equal, round, and reactive to light.  Cardiovascular:     Rate and Rhythm: Normal rate and regular rhythm.     Pulses: Normal pulses.     Heart sounds: Normal heart sounds.  Pulmonary:     Effort: Pulmonary effort is normal. No respiratory distress.     Breath sounds: Normal breath sounds. No stridor. No wheezing, rhonchi or rales.  Abdominal:     General: Bowel sounds are normal.     Palpations: Abdomen is soft.     Tenderness: There is no abdominal tenderness.  Musculoskeletal:     Cervical back: Normal range of motion.  Lymphadenopathy:     Cervical: No cervical adenopathy.  Skin:    General: Skin is warm and dry.  Neurological:     General: No focal deficit present.     Mental Status: He is alert and oriented to person, place, and time.  Psychiatric:        Mood and Affect: Mood normal.        Behavior: Behavior normal.      UC Treatments / Results  Labs (all labs  ordered are listed, but only abnormal results are displayed) Labs Reviewed -  No data to display  EKG   Radiology No results found.  Procedures Procedures (including critical care time)  Medications Ordered in UC Medications - No data to display  Initial Impression / Assessment and Plan / UC Course  I have reviewed the triage vital signs and the nursing notes.  Pertinent labs & imaging results that were available during my care of the patient were reviewed by me and considered in my medical decision making (see chart for details).  Symptoms consistent with a viral upper respiratory infection.  Patient declines viral testing.  Will treat symptomatically with fluticasone 50 mcg nasal spray for nasal congestion and head congestion, and Bromfed-DM for the cough, and cetirizine for postnasal drainage and congestion.  Supportive care recommendations were provided and discussed with the patient to include fluids, rest, over-the-counter analgesics, normal saline nasal spray.  Discussed indications with the patient of when follow-up be necessary.  Patient was in agreement with this plan of care and verbalized understanding.  All questions were answered.  Patient stable for discharge.  Work note was provided.   Final Clinical Impressions(s) / UC Diagnoses   Final diagnoses:  Viral upper respiratory tract infection with cough     Discharge Instructions      Take medication as prescribed. Increase fluids and allow for plenty of rest. May take over-the-counter Tylenol or ibuprofen as needed for pain, fever, or general discomfort. Warm salt water gargles as needed for throat pain or discomfort. Normal saline nasal spray as needed for nasal congestion and runny nose. Recommend using a humidifier in the bedroom at nighttime during sleep or sleeping elevated on pillows while cough symptoms persist. If symptoms do not improve over the next 7 to 10 days, you may follow-up in this clinic or with  your primary care physician for further evaluation. Follow-up as needed.     ED Prescriptions     Medication Sig Dispense Auth. Provider   brompheniramine-pseudoephedrine-DM 30-2-10 MG/5ML syrup Take 5 mLs by mouth 4 (four) times daily as needed. 140 mL Leath-Warren, Sadie Haber, NP   fluticasone (FLONASE) 50 MCG/ACT nasal spray Place 2 sprays into both nostrils daily. 16 g Leath-Warren, Sadie Haber, NP   cetirizine (ZYRTEC) 10 MG tablet Take 1 tablet (10 mg total) by mouth daily. 30 tablet Leath-Warren, Sadie Haber, NP      PDMP not reviewed this encounter.   Abran Cantor, NP 11/09/23 1333

## 2023-12-04 ENCOUNTER — Encounter (INDEPENDENT_AMBULATORY_CARE_PROVIDER_SITE_OTHER): Payer: Self-pay | Admitting: *Deleted

## 2023-12-20 ENCOUNTER — Other Ambulatory Visit: Payer: Self-pay | Admitting: Gastroenterology

## 2023-12-23 ENCOUNTER — Ambulatory Visit
Admission: EM | Admit: 2023-12-23 | Discharge: 2023-12-23 | Disposition: A | Discharge status: Home / Self Care | Payer: Medicaid Other | Attending: Family Medicine | Admitting: Family Medicine

## 2023-12-23 DIAGNOSIS — J3089 Other allergic rhinitis: Secondary | ICD-10-CM | POA: Diagnosis not present

## 2023-12-23 DIAGNOSIS — J0100 Acute maxillary sinusitis, unspecified: Secondary | ICD-10-CM | POA: Diagnosis not present

## 2023-12-23 MED ORDER — CETIRIZINE HCL 10 MG PO TABS: 10.0000 mg | ORAL_TABLET | Freq: Every day | ORAL | 2 refills | Status: DC

## 2023-12-23 MED ORDER — AMOXICILLIN-POT CLAVULANATE 875-125 MG PO TABS: 1.0000 | ORAL_TABLET | Freq: Two times a day (BID) | ORAL | 0 refills | Status: DC

## 2023-12-23 MED ORDER — AZELASTINE HCL 0.1 % NA SOLN: 1.0000 | Freq: Two times a day (BID) | NASAL | 12 refills | Status: DC

## 2023-12-23 NOTE — ED Provider Notes (Signed)
 [facility]    CSN: [id number] Arrival date & time: 12/23/23  0856      History   Chief Complaint No chief complaint on file.   HPI [name] is a 43 y.o. male.   Patient presenting today with 2-week history of cough, congestion, sinus pressure, headache, nausea has progressively worsened over time.  Denies fever, chills, chest pain, shortness of breath, abdominal pain, vomiting, diarrhea.  So far trying over-the-counter nasal sprays and NyQuil with no relief.  History of seasonal allergies not currently on antihistamines.    Past Medical History:  Diagnosis Date   ADHD (attention deficit hyperactivity disorder)    Asthma     Patient Active Problem List   Diagnosis Date Noted   Noninfectious gastroenteritis 10/21/2023   Adjustment disorder with mixed anxiety and depressed mood 04/06/2016   Attention deficit hyperactivity disorder (ADHD) 04/06/2016   Suicide ideation 04/04/2016    Past Surgical History:  Procedure Laterality Date   BIOPSY  10/21/2023   Procedure: BIOPSY;  Surgeon: Cinderella Deatrice FALCON, MD;  Location: [facility];  Service: Endoscopy;;   COLONOSCOPY WITH PROPOFOL  N/A 10/21/2023   Procedure: COLONOSCOPY WITH PROPOFOL ;  Surgeon: Cinderella Deatrice FALCON, MD;  Location: [facility];  Service: Endoscopy;  Laterality: N/A;  200pm, asa 2   head surgery (as a baby)         Home Medications    Prior to Admission medications   Medication Sig Start Date End Date Taking? Authorizing Provider  amoxicillin -clavulanate (AUGMENTIN ) 875-125 MG tablet Take 1 tablet by mouth every 12 (twelve) hours. 12/23/23  Yes Stuart Vernell Norris, PA-C  azelastine  (ASTELIN ) 0.1 % nasal spray Place 1 spray into both nostrils 2 (two) times daily. Use in each nostril as directed 12/23/23  Yes Stuart Vernell Norris, PA-C  cetirizine  (ZYRTEC  ALLERGY) 10 MG tablet Take 1 tablet (10 mg total) by mouth daily. 12/23/23  Yes Stuart Vernell Norris, PA-C   brompheniramine-pseudoephedrine -DM 30-2-10 MG/5ML syrup Take 5 mLs by mouth 4 (four) times daily as needed. 11/09/23   Leath-Warren, Etta PARAS, NP  cetirizine  (ZYRTEC ) 10 MG tablet Take 1 tablet (10 mg total) by mouth daily. 11/09/23   Leath-Warren, Etta PARAS, NP  dicyclomine  (BENTYL ) 10 MG capsule Take 1 capsule (10 mg total) by mouth 3 (three) times daily as needed for spasms (abdominal pain). 09/23/23   Kennedy Charmaine CROME, NP  fluticasone  (FLONASE ) 50 MCG/ACT nasal spray Place 2 sprays into both nostrils daily. 11/09/23   Leath-Warren, Etta PARAS, NP  lidocaine  4 % Place 1 patch onto the skin daily. 09/22/23   Neysa Caron PARAS, DO  methocarbamol  (ROBAXIN ) 500 MG tablet Take 1 tablet (500 mg total) by mouth 2 (two) times daily. 09/22/23   Neysa Caron PARAS, DO  pantoprazole  (PROTONIX ) 40 MG tablet TAKE 1 TABLET(40 MG) BY MOUTH DAILY 12/20/23   Kennedy Charmaine CROME, NP    Family History Family History  Problem Relation Age of Onset   Healthy Mother    Healthy Father    Cancer Maternal Uncle    Heart disease Maternal Uncle    Colon cancer Neg Hx    Colon polyps Neg Hx     Social History Social History   Tobacco Use   Smoking status: Every Day    Current packs/day: 0.50    Types: Cigarettes   Smokeless tobacco: Never  Vaping Use   Vaping status: Never Used  Substance Use Topics   Alcohol use: Yes    Comment:  occasional   Drug use: Not Currently    Types: Cocaine, Marijuana    Comment: previous cocaine but current marijauna last week     Allergies   Bee venom   Review of Systems Review of Systems Per HPI  Physical Exam Triage Vital Signs ED Triage Vitals  Encounter Vitals Group     BP 12/23/23 0920 136/78     Systolic BP Percentile --      Diastolic BP Percentile --      Pulse Rate 12/23/23 0920 74     Resp 12/23/23 0920 19     Temp 12/23/23 0920 98.5 F (36.9 C)     Temp Source 12/23/23 0920 Oral     SpO2 12/23/23 0920 96 %     Weight --      Height --      Head  Circumference --      Peak Flow --      Pain Score 12/23/23 0923 0     Pain Loc --      Pain Education --      Exclude from Growth Chart --    No data found.  Updated Vital Signs BP 136/78 (BP Location: Right Arm)   Pulse 74   Temp 98.5 F (36.9 C) (Oral)   Resp 19   SpO2 96%   Visual Acuity Right Eye Distance:   Left Eye Distance:   Bilateral Distance:    Right Eye Near:   Left Eye Near:    Bilateral Near:     Physical Exam Vitals and nursing note reviewed.  Constitutional:      Appearance: He is well-developed.  HENT:     Head: Atraumatic.     Right Ear: External ear normal.     Left Ear: External ear normal.     Nose: Congestion present.     Mouth/Throat:     Pharynx: Posterior oropharyngeal erythema present. No oropharyngeal exudate.  Eyes:     Conjunctiva/sclera: Conjunctivae normal.     Pupils: Pupils are equal, round, and reactive to light.  Cardiovascular:     Rate and Rhythm: Normal rate and regular rhythm.  Pulmonary:     Effort: Pulmonary effort is normal. No respiratory distress.     Breath sounds: No wheezing or rales.  Musculoskeletal:        General: Normal range of motion.     Cervical back: Normal range of motion and neck supple.  Lymphadenopathy:     Cervical: No cervical adenopathy.  Skin:    General: Skin is warm and dry.  Neurological:     Mental Status: He is alert and oriented to person, place, and time.  Psychiatric:        Behavior: Behavior normal.    UC Treatments / Results  Labs (all labs ordered are listed, but only abnormal results are displayed) Labs Reviewed - No data to display  EKG  Radiology No results found.  Procedures Procedures (including critical care time)  Medications Ordered in UC Medications - No data to display  Initial Impression / Assessment and Plan / UC Course  I have reviewed the triage vital signs and the nursing notes.  Pertinent labs & imaging results that were available during my care of  the patient were reviewed by me and considered in my medical decision making (see chart for details).     Given duration worsening course, will treat with Augmentin  and start good allergy regimen with Zyrtec  and Astelin  daily.  Discussed  sinus rinses, supportive over-the-counter medications and home care additionally.  Work note given.  Final Clinical Impressions(s) / UC Diagnoses   Final diagnoses:  Seasonal allergic rhinitis due to other allergic trigger  Acute non-recurrent maxillary sinusitis     Discharge Instructions      Take your nasal spray and allergy medication daily consistently, you may also do DayQuil and NyQuil as needed for breakthrough symptoms and sinus rinses, humidifiers.  I have sent in an antibiotic for a sinus infection that has likely been caused by uncontrolled allergies.    ED Prescriptions     Medication Sig Dispense Auth. Provider   amoxicillin -clavulanate (AUGMENTIN ) 875-125 MG tablet Take 1 tablet by mouth every 12 (twelve) hours. 14 tablet Stuart Vernell Norris, PA-C   cetirizine  (ZYRTEC  ALLERGY) 10 MG tablet Take 1 tablet (10 mg total) by mouth daily. 30 tablet Stuart Vernell Norris, PA-C   azelastine  (ASTELIN ) 0.1 % nasal spray Place 1 spray into both nostrils 2 (two) times daily. Use in each nostril as directed 30 mL Stuart Vernell Norris, PA-C      PDMP not reviewed this encounter.   Stuart Vernell [place], [place] 12/23/23 [phone]

## 2023-12-23 NOTE — ED Triage Notes (Addendum)
 Pt reports, cough, chest congestion, nasal congestion, sinus pressure, headache, nausea. X 2 weeks pt states he also feels fatigues and sluggish.

## 2023-12-23 NOTE — Discharge Instructions (Signed)
 Take your nasal spray and allergy medication daily consistently, you may also do DayQuil and NyQuil as needed for breakthrough symptoms and sinus rinses, humidifiers.  I have sent in an antibiotic for a sinus infection that has likely been caused by uncontrolled allergies.

## 2024-01-02 ENCOUNTER — Encounter: Payer: Self-pay | Admitting: Gastroenterology

## 2024-01-02 ENCOUNTER — Ambulatory Visit: Payer: Medicaid Other | Admitting: Gastroenterology

## 2024-01-02 NOTE — Progress Notes (Deleted)
GI Office Note    Referring Provider: No ref. provider found Primary Care Physician:  Pcp, No Primary Gastroenterologist: [name], MD  Date:  01/02/2024  ID:  [name], DOB December 14, 1981, MRN [id number]   Chief Complaint   No chief complaint on file.   History of Present Illness  [name] is a 43 y.o. male with a history of *** presenting today with complaint of   ED visit 09/07/23 for abdominal pain.  Labs unremarkable other than mildly low calcium at 8.6 and total protein 6.1.  UA normal.  Normal CBC and lipase.  He was treated with Dilaudid, Zofran, and IV fluids   CT A/P 09/07/23: -No pancreatic or hepatobiliary abnormality -Spleen unremarkable -Mural thickening and mucosal hyperenhancement noted extending from the level of the rectum approximately to the level of the mid transverse colon concerning for colitis (advised to correlate for signs and symptoms of ulcerative colitis) -No other abnormality noted   Initial office visit 09/09/2023.  Denied any nausea or vomiting but did report urinary urgency but not with bowel movements.  Sometimes has a bowel movement twice a day, sometimes skips a day.  Denied any incontinence.  Abdominal pain worsens with eating, usually in the mornings.  Reportedly very active in the gym but reported weight loss recently.  No fevers.  Had had some rectal bleeding a few months prior and does report a history of hemorrhoids. Advised to check CRP, HSV, fecal calprotectin, and stool studies.  Given dicyclomine as needed for abdominal pain.  Potentially give antibiotics if stool studies positive.  Advised to schedule colonoscopy for 3-4 weeks with Dr. Tasia Catchings.  Given education on colitis and ulcerative colitis.  Provided work note for patient.  Follow-up 8 weeks   ED visit 10/5 and 10/6 for abdominal pain.  Also complaining of back pain.  Received Toradol injection and lidocaine patch. Patient discharged with Augmentin and prednisone on 10/5  despite no evidence of fever just in case this is infectious process.  Labs with normal LFTs.   OV 09/23/2023.  Reportedly having pain in his lower abdomen and sometimes radiating to his back.  At 1 time reported having pain to radiate to his right arm.  Also had some ankle pain.  Had started antibiotics and steroids the day prior, back pain coming and going.  Has been staying away from sodas, only drinking water and Gatorade and trying to eat better.  Having nausea on workdays and only had 1 episode of vomiting.  Usually having a bowel movement every 1-2 days.  Sometimes eating veggies helps him go to the bathroom.  Had not had much alcohol use recently.  Advise need to schedule colonoscopy to further evaluate CT findings.  Advised to check CRP, fecal calprotectin, iron panel, and stool studies.  Advised to continue dicyclomine 3 times daily as needed and start a daily fiber supplement.  Continue pantoprazole.  Advised him that we would fill out FMLA paperwork.   Colonoscopy 10/21/23: - Preparation of the colon was inadequate.  - The CT demonstrated colitis of Descending and to rectum which was adeqauetly examined without any apparent colitis  - Stool in the entire examined colon.  - Non- bleeding external and internal hemorrhoids.  - Multiple biopsies were obtained in the rectum, in the sigmoid colon and in the descending colon. - Path: Colonic mucosa with focal lamina propria edema and mild reactive/reparative change, nonspecific. Negative for activity, chronicity, viral cytopathic effect, increased  intraepithelial lymphocytes, basement  membrane thickening and dysplasia.  - Repeat colonoscopy at next available appointment (within 3 months) because the bowel preparation was poor.  Last office visit 11/04/23.  Reported trying to eat extra fiber, solids, and avoiding soft drinks.  He reports rectal pain for about 5 minutes when going to the bathroom and sometimes stools are hard but at times feels though  this is a small ball of stool.  Also having some itching and some pain but is easier to go when he eats green vegetables.  Not taking the dicyclomine and no longer having abdominal pain.  Had recently stopped taking pantoprazole as he denied any regular acid reflux symptoms but will have a mild flare if eating tacos.  Has had some mild decrease in appetite.   Today:    Wt Readings from Last 3 Encounters:  11/04/23 189 lb 12.8 oz (86.1 kg)  09/23/23 190 lb 3.2 oz (86.3 kg)  09/22/23 198 lb (89.8 kg)    Current Outpatient Medications  Medication Sig Dispense Refill   amoxicillin-clavulanate (AUGMENTIN) 875-125 MG tablet Take 1 tablet by mouth every 12 (twelve) hours. 14 tablet 0   azelastine (ASTELIN) 0.1 % nasal spray Place 1 spray into both nostrils 2 (two) times daily. Use in each nostril as directed 30 mL 12   brompheniramine-pseudoephedrine-DM 30-2-10 MG/5ML syrup Take 5 mLs by mouth 4 (four) times daily as needed. 140 mL 0   cetirizine (ZYRTEC ALLERGY) 10 MG tablet Take 1 tablet (10 mg total) by mouth daily. 30 tablet 2   cetirizine (ZYRTEC) 10 MG tablet Take 1 tablet (10 mg total) by mouth daily. 30 tablet 0   dicyclomine (BENTYL) 10 MG capsule Take 1 capsule (10 mg total) by mouth 3 (three) times daily as needed for spasms (abdominal pain). 30 capsule 1   fluticasone (FLONASE) 50 MCG/ACT nasal spray Place 2 sprays into both nostrils daily. 16 g 0   lidocaine 4 % Place 1 patch onto the skin daily. 15 patch 0   methocarbamol (ROBAXIN) 500 MG tablet Take 1 tablet (500 mg total) by mouth 2 (two) times daily. 20 tablet 0   pantoprazole (PROTONIX) 40 MG tablet TAKE 1 TABLET(40 MG) BY MOUTH DAILY 30 tablet 2   No current facility-administered medications for this visit.    Past Medical History:  Diagnosis Date   ADHD (attention deficit hyperactivity disorder)    Asthma     Past Surgical History:  Procedure Laterality Date   BIOPSY  10/21/2023   Procedure: BIOPSY;  Surgeon: Franky Macho, MD;  Location: [facility];  Service: Endoscopy;;   COLONOSCOPY WITH PROPOFOL N/A 10/21/2023   Procedure: COLONOSCOPY WITH PROPOFOL;  Surgeon: Franky Macho, MD;  Location: [facility];  Service: Endoscopy;  Laterality: N/A;  200pm, asa 2   head surgery (as a baby)      Family History  Problem Relation Age of Onset   Healthy Mother    Healthy Father    Cancer Maternal Uncle    Heart disease Maternal Uncle    Colon cancer Neg Hx    Colon polyps Neg Hx     Allergies as of 01/02/2024 - Review Complete 12/23/2023  Allergen Reaction Noted   Bee venom Anaphylaxis 08/31/2012    Social History   Socioeconomic History   Marital status: Single    Spouse name: Not on file   Number of children: Not on file   Years of education: Not on file   Highest education level: Not  on file  Occupational History   Not on file  Tobacco Use   Smoking status: Every Day    Current packs/day: 0.50    Types: Cigarettes   Smokeless tobacco: Never  Vaping Use   Vaping status: Never Used  Substance and Sexual Activity   Alcohol use: Yes    Comment: occasional   Drug use: Not Currently    Types: Cocaine, Marijuana    Comment: previous cocaine but current marijauna last week   Sexual activity: Yes  Other Topics Concern   Not on file  Social History Narrative   Not on file   Social Drivers of Health   Financial Resource Strain: Not on file  Food Insecurity: Not on file  Transportation Needs: No Transportation Needs (06/03/2023)   PRAPARE - Administrator, Civil Service (Medical): No    Lack of Transportation (Non-Medical): No  Physical Activity: Not on file  Stress: Not on file  Social Connections: Not on file     Review of Systems   Gen: Denies fever, chills, anorexia. Denies fatigue, weakness, weight loss.  CV: Denies chest pain, palpitations, syncope, peripheral edema, and claudication. Resp: Denies dyspnea at rest, cough, wheezing, coughing up blood,  and pleurisy. GI: See HPI Derm: Denies rash, itching, dry skin Psych: Denies depression, anxiety, memory loss, confusion. No homicidal or suicidal ideation.  Heme: Denies bruising, bleeding, and enlarged lymph nodes.  Physical Exam   There were no vitals taken for this visit.  General:   Alert and oriented. No distress noted. Pleasant and cooperative.  Head:  Normocephalic and atraumatic. Eyes:  Conjuctiva clear without scleral icterus. Mouth:  Oral mucosa pink and moist. Good dentition. No lesions. Lungs:  Clear to auscultation bilaterally. No wheezes, rales, or rhonchi. No distress.  Heart:  S1, S2 present without murmurs appreciated.  Abdomen:  +BS, soft, non-tender and non-distended. No rebound or guarding. No HSM or masses noted. Rectal: *** Msk:  Symmetrical without gross deformities. Normal posture. Extremities:  Without edema. Neurologic:  Alert and  oriented x4 Psych:  Alert and cooperative. Normal mood and affect.  Assessment  [name] is a 43 y.o. male with a history of  ADHD, asthma, and fairly recent colitis  presenting today with   History of colitis, abdominal pain:   Pruritus ani, rectal pain:   Constipation:   GERD, N/V:   PLAN   *** Proceed with colonoscopy with propofol by Dr. Tasia Catchings in near future: the risks, benefits, and alternatives have been discussed with the patient in detail. The patient states understanding and desires to proceed. ASA ***2 Linzess 145 mcg daily for 4 days prior (provide sample) GERD diet, tums as needed Anusol as needed? Miralax? Mag oxide?    Brooke Bonito, MSN, FNP-BC, AGACNP-BC [facility]

## 2024-01-03 ENCOUNTER — Encounter (HOSPITAL_COMMUNITY): Payer: Self-pay | Admitting: *Deleted

## 2024-01-03 ENCOUNTER — Other Ambulatory Visit: Payer: Self-pay

## 2024-01-03 ENCOUNTER — Emergency Department (HOSPITAL_COMMUNITY)
Admission: EM | Admit: 2024-01-03 | Discharge: 2024-01-03 | Disposition: A | Discharge status: Home / Self Care | Payer: Medicaid Other | Attending: Emergency Medicine | Admitting: Emergency Medicine

## 2024-01-03 DIAGNOSIS — B338 Other specified viral diseases: Secondary | ICD-10-CM

## 2024-01-03 DIAGNOSIS — Z1152 Encounter for screening for COVID-19: Secondary | ICD-10-CM | POA: Diagnosis not present

## 2024-01-03 DIAGNOSIS — B974 Respiratory syncytial virus as the cause of diseases classified elsewhere: Secondary | ICD-10-CM | POA: Insufficient documentation

## 2024-01-03 DIAGNOSIS — R059 Cough, unspecified: Secondary | ICD-10-CM | POA: Diagnosis not present

## 2024-01-03 LAB — RESP PANEL BY RT-PCR (RSV, FLU A&B, COVID)  RVPGX2
Influenza A by PCR: NEGATIVE
Influenza B by PCR: NEGATIVE
Resp Syncytial Virus by PCR: POSITIVE — AB
SARS Coronavirus 2 by RT PCR: NEGATIVE

## 2024-01-03 MED ORDER — GUAIFENESIN-CODEINE 100-10 MG/5ML PO SOLN: 10.0000 mL | Freq: Four times a day (QID) | ORAL | 0 refills | Status: DC | PRN

## 2024-01-03 MED ORDER — ALBUTEROL SULFATE HFA 108 (90 BASE) MCG/ACT IN AERS
2.0000 | INHALATION_SPRAY | RESPIRATORY_TRACT | Status: DC | PRN
  Administered 2024-01-03: 2 via RESPIRATORY_TRACT
  Filled 2024-01-03: qty 6.7

## 2024-01-03 MED ORDER — AEROCHAMBER PLUS FLO-VU MEDIUM MISC
1.0000 | Freq: Once | Status: AC
  Administered 2024-01-03: 1
  Filled 2024-01-03: qty 1

## 2024-01-03 NOTE — Discharge Instructions (Signed)
You may take 2 puffs of the inhaler given every 4 hours if needed for cough and wheezing.  You may also take the cough medicine prescribed - this will make you drowsy - do not drive within 4 hours of taking this medicine.  Rest and make sure you are drinking plenty of fluids.  You do not need the antibiotic you were prescribed  last week - I don't recommend filling it at this time.

## 2024-01-03 NOTE — ED Triage Notes (Addendum)
Pt c/o cough, cold chills, sore throat, runny nose, nausea, bowel incontinence when coughing that started this past week. Pt diagnosed with an upper resp infection by Urgent Care and was prescribed an antibiotic, but hasn't been able to pick it up because he doesn't have money.

## 2024-01-03 NOTE — ED Notes (Signed)
Pt states he was around his cousin last week that has covid

## 2024-01-04 NOTE — ED Provider Notes (Signed)
EMERGENCY DEPARTMENT AT [facility] Provider Note   CSN: [id number] Arrival date & time: 01/03/24  1325     History  Chief Complaint  Patient presents with   Cough    [name] is a 43 y.o. male with no significant past medical history presenting with URI type symptoms including cough which is occasionally productive of yellow sputum, cold chills without documented fevers, scratchy throat, runny nose nausea and has noted several episodes of stool leakage with near uncontrolled coughing but denies gross diarrheal symptoms. He has noted intermittent wheezing,  especially when exerting like trying to ride his bike to work.   He was seen at urgent care for the same complaint on January 6 at which time he was diagnosed with sinusitis, he was placed on Augmentin but was unable to afford this antibiotic.  He currently denies any facial or sinus pain, his nasal secretions have been clear and watery.  He denies abdominal pain, shortness of breath, chest pain.  The history is provided by the patient.       Home Medications Prior to Admission medications   Medication Sig Start Date End Date Taking? Authorizing Provider  amoxicillin-clavulanate (AUGMENTIN) 875-125 MG tablet Take 1 tablet by mouth every 12 (twelve) hours. 12/23/23   Particia Nearing, PA-C  azelastine (ASTELIN) 0.1 % nasal spray Place 1 spray into both nostrils 2 (two) times daily. Use in each nostril as directed 12/23/23   Particia Nearing, PA-C  cetirizine (ZYRTEC ALLERGY) 10 MG tablet Take 1 tablet (10 mg total) by mouth daily. 12/23/23   Particia Nearing, PA-C  cetirizine (ZYRTEC) 10 MG tablet Take 1 tablet (10 mg total) by mouth daily. 11/09/23   Leath-Warren, Sadie Haber, NP  dicyclomine (BENTYL) 10 MG capsule Take 1 capsule (10 mg total) by mouth 3 (three) times daily as needed for spasms (abdominal pain). 09/23/23   Aida Raider, NP  fluticasone (FLONASE) 50 MCG/ACT nasal spray Place 2  sprays into both nostrils daily. 11/09/23   Leath-Warren, Sadie Haber, NP  guaiFENesin-codeine 100-10 MG/5ML syrup Take 10 mLs by mouth every 6 (six) hours as needed for cough. 01/03/24   Burgess Amor, PA-C  lidocaine 4 % Place 1 patch onto the skin daily. 09/22/23   Coral Spikes, DO  methocarbamol (ROBAXIN) 500 MG tablet Take 1 tablet (500 mg total) by mouth 2 (two) times daily. 09/22/23   Coral Spikes, DO  pantoprazole (PROTONIX) 40 MG tablet TAKE 1 TABLET(40 MG) BY MOUTH DAILY 12/20/23   Aida Raider, NP      Allergies    Bee venom    Review of Systems   Review of Systems  Constitutional:  Positive for chills. Negative for fever.  HENT:  Positive for congestion, rhinorrhea and sore throat. Negative for nosebleeds, sinus pressure and sinus pain.   Eyes: Negative.   Respiratory:  Positive for cough and wheezing. Negative for chest tightness and shortness of breath.   Cardiovascular:  Negative for chest pain.  Gastrointestinal:  Positive for nausea. Negative for abdominal pain and vomiting.  Genitourinary: Negative.   Musculoskeletal:  Negative for arthralgias, joint swelling and neck pain.  Skin: Negative.  Negative for rash and wound.  Neurological:  Negative for dizziness, weakness, light-headedness, numbness and headaches.  Psychiatric/Behavioral: Negative.    All other systems reviewed and are negative.   Physical Exam Updated Vital Signs BP 128/79 (BP Location: Right Arm)   Pulse 86   Temp 98.5  F (36.9 C) (Oral)   Resp 20   Ht 5\' 10"  (1.778 m)   Wt 85.7 kg   SpO2 96%   BMI 27.12 kg/m  Physical Exam Constitutional:      Appearance: He is well-developed.  HENT:     Head: Normocephalic and atraumatic.     Right Ear: Tympanic membrane and ear canal normal.     Left Ear: Tympanic membrane and ear canal normal.     Nose: Mucosal edema and rhinorrhea present.     Comments: No sinus tenderness.    Mouth/Throat:     Mouth: Mucous membranes are moist.     Pharynx:  Oropharynx is clear. Uvula midline. No oropharyngeal exudate or posterior oropharyngeal erythema.     Tonsils: No tonsillar abscesses.  Eyes:     Conjunctiva/sclera: Conjunctivae normal.  Cardiovascular:     Rate and Rhythm: Normal rate.     Heart sounds: Normal heart sounds.  Pulmonary:     Effort: Pulmonary effort is normal. No respiratory distress.     Breath sounds: No wheezing or rales.     Comments: No wheezing currently appreciated on exam.  He does have a frequent dry sounding cough. Abdominal:     Palpations: Abdomen is soft.     Tenderness: There is no abdominal tenderness.  Musculoskeletal:        General: Normal range of motion.  Skin:    General: Skin is warm and dry.     Findings: No rash.  Neurological:     Mental Status: He is alert and oriented to person, place, and time.     ED Results / Procedures / Treatments   Labs (all labs ordered are listed, but only abnormal results are displayed) Labs Reviewed  RESP PANEL BY RT-PCR (RSV, FLU A&B, COVID)  RVPGX2 - Abnormal; Notable for the following components:      Result Value   Resp Syncytial Virus by PCR POSITIVE (*)    All other components within normal limits    EKG None  Radiology No results found.  Procedures Procedures    Medications Ordered in ED Medications  AeroChamber Plus Flo-Vu Medium MISC 1 each (1 each Other Given 01/03/24 1822)    ED Course/ Medical Decision Making/ A&P                                 Medical Decision Making Patient with URI type symptoms along with intermittent wheezing which appears to be more exertional in presentation, his exam is reassuring here, especially his lung exam, lungs sound clear.  Given his history of wheezing he was given albuterol MDI however for as needed use, this may also help with his cough symptom.  He was also prescribed Robitussin AC, we discussed pros and cons of the Augmentin, this point his symptoms do not sound like a bacterial sinusitis, he  is positive for RSV which would explain today's symptoms.  Discussed other home treatments, rest, Tylenol or Motrin.  Parent follow-up anticipated.  Amount and/or Complexity of Data Reviewed Labs: ordered.    Details: Respiratory panel reviewed, positive for RSV Radiology:     Details: Chest x-ray not indicated, lungs are clear without concern for pneumonia.  Risk OTC drugs. Prescription drug management.           Final Clinical Impression(s) / ED Diagnoses Final diagnoses:  RSV (respiratory syncytial virus infection)    Rx / DC Orders  ED Discharge Orders          Ordered    guaiFENesin-codeine 100-10 MG/5ML syrup  Every 6 hours PRN,   Status:  Discontinued        01/03/24 1744    guaiFENesin-codeine 100-10 MG/5ML syrup  Every 6 hours PRN,   Status:  Discontinued        01/03/24 1810    guaiFENesin-codeine 100-10 MG/5ML syrup  Every 6 hours PRN        01/03/24 1814              Burgess Amor, PA-C 01/04/24 0910    Anders Simmonds T, DO 01/05/24 [phone]

## 2024-01-24 DIAGNOSIS — Z20822 Contact with and (suspected) exposure to covid-19: Secondary | ICD-10-CM | POA: Diagnosis not present

## 2024-01-24 DIAGNOSIS — J1089 Influenza due to other identified influenza virus with other manifestations: Secondary | ICD-10-CM | POA: Diagnosis not present

## 2024-02-26 DIAGNOSIS — Z6825 Body mass index (BMI) 25.0-25.9, adult: Secondary | ICD-10-CM | POA: Diagnosis not present

## 2024-02-26 DIAGNOSIS — R030 Elevated blood-pressure reading, without diagnosis of hypertension: Secondary | ICD-10-CM | POA: Diagnosis not present

## 2024-02-26 DIAGNOSIS — E663 Overweight: Secondary | ICD-10-CM | POA: Diagnosis not present

## 2024-02-26 DIAGNOSIS — R0981 Nasal congestion: Secondary | ICD-10-CM | POA: Diagnosis not present

## 2024-03-29 DIAGNOSIS — Z720 Tobacco use: Secondary | ICD-10-CM | POA: Diagnosis not present

## 2024-03-29 DIAGNOSIS — J439 Emphysema, unspecified: Secondary | ICD-10-CM | POA: Diagnosis not present

## 2024-03-29 DIAGNOSIS — F17210 Nicotine dependence, cigarettes, uncomplicated: Secondary | ICD-10-CM | POA: Diagnosis not present

## 2024-03-29 DIAGNOSIS — Z716 Tobacco abuse counseling: Secondary | ICD-10-CM | POA: Diagnosis not present

## 2024-03-29 DIAGNOSIS — R053 Chronic cough: Secondary | ICD-10-CM | POA: Diagnosis not present

## 2024-03-29 DIAGNOSIS — R918 Other nonspecific abnormal finding of lung field: Secondary | ICD-10-CM | POA: Diagnosis not present

## 2024-03-29 DIAGNOSIS — R059 Cough, unspecified: Secondary | ICD-10-CM | POA: Diagnosis not present

## 2024-04-03 DIAGNOSIS — L501 Idiopathic urticaria: Secondary | ICD-10-CM | POA: Diagnosis not present

## 2024-04-03 DIAGNOSIS — Z91030 Bee allergy status: Secondary | ICD-10-CM | POA: Diagnosis not present

## 2024-04-07 DIAGNOSIS — F430 Acute stress reaction: Secondary | ICD-10-CM | POA: Diagnosis not present

## 2024-04-07 DIAGNOSIS — R059 Cough, unspecified: Secondary | ICD-10-CM | POA: Diagnosis not present

## 2024-04-07 DIAGNOSIS — Z5900 Homelessness unspecified: Secondary | ICD-10-CM | POA: Diagnosis not present

## 2024-04-07 DIAGNOSIS — J439 Emphysema, unspecified: Secondary | ICD-10-CM | POA: Diagnosis not present

## 2024-04-17 ENCOUNTER — Other Ambulatory Visit (HOSPITAL_COMMUNITY)
Admission: EM | Admit: 2024-04-17 | Discharge: 2024-04-22 | Disposition: A | Discharge status: Home / Self Care | Attending: Psychiatry | Admitting: Psychiatry

## 2024-04-17 ENCOUNTER — Ambulatory Visit (HOSPITAL_COMMUNITY)
Admission: EM | Admit: 2024-04-17 | Discharge: 2024-04-17 | Attending: Nurse Practitioner | Admitting: Nurse Practitioner

## 2024-04-17 ENCOUNTER — Ambulatory Visit (HOSPITAL_COMMUNITY): Admission: EM | Admit: 2024-04-17 | Discharge: 2024-04-17 | Disposition: A | Discharge status: Home / Self Care

## 2024-04-17 DIAGNOSIS — Z9151 Personal history of suicidal behavior: Secondary | ICD-10-CM | POA: Insufficient documentation

## 2024-04-17 DIAGNOSIS — F17210 Nicotine dependence, cigarettes, uncomplicated: Secondary | ICD-10-CM | POA: Diagnosis not present

## 2024-04-17 DIAGNOSIS — Z8659 Personal history of other mental and behavioral disorders: Secondary | ICD-10-CM

## 2024-04-17 DIAGNOSIS — F1491 Cocaine use, unspecified, in remission: Secondary | ICD-10-CM | POA: Diagnosis not present

## 2024-04-17 DIAGNOSIS — F1090 Alcohol use, unspecified, uncomplicated: Secondary | ICD-10-CM | POA: Diagnosis present

## 2024-04-17 DIAGNOSIS — Z5900 Homelessness unspecified: Secondary | ICD-10-CM | POA: Insufficient documentation

## 2024-04-17 DIAGNOSIS — F17200 Nicotine dependence, unspecified, uncomplicated: Secondary | ICD-10-CM | POA: Diagnosis present

## 2024-04-17 DIAGNOSIS — F1020 Alcohol dependence, uncomplicated: Secondary | ICD-10-CM | POA: Insufficient documentation

## 2024-04-17 DIAGNOSIS — F325 Major depressive disorder, single episode, in full remission: Secondary | ICD-10-CM | POA: Insufficient documentation

## 2024-04-17 DIAGNOSIS — F1994 Other psychoactive substance use, unspecified with psychoactive substance-induced mood disorder: Secondary | ICD-10-CM | POA: Diagnosis present

## 2024-04-17 LAB — POCT URINE DRUG SCREEN - MANUAL ENTRY (I-SCREEN)
POC Amphetamine UR: NOT DETECTED
POC Buprenorphine (BUP): NOT DETECTED
POC Cocaine UR: NOT DETECTED
POC Marijuana UR: NOT DETECTED
POC Methadone UR: NOT DETECTED
POC Methamphetamine UR: NOT DETECTED
POC Morphine: NOT DETECTED
POC Oxazepam (BZO): NOT DETECTED
POC Oxycodone UR: NOT DETECTED
POC Secobarbital (BAR): NOT DETECTED

## 2024-04-17 LAB — CBC WITH DIFFERENTIAL/PLATELET
Abs Immature Granulocytes: 0.05 10*3/uL (ref 0.00–0.07)
Basophils Absolute: 0.1 10*3/uL (ref 0.0–0.1)
Basophils Relative: 1 %
Eosinophils Absolute: 0.2 10*3/uL (ref 0.0–0.5)
Eosinophils Relative: 2 %
HCT: 47.6 % (ref 39.0–52.0)
Hemoglobin: 16.4 g/dL (ref 13.0–17.0)
Immature Granulocytes: 1 %
Lymphocytes Relative: 31 %
Lymphs Abs: 2.5 10*3/uL (ref 0.7–4.0)
MCH: 29.2 pg (ref 26.0–34.0)
MCHC: 34.5 g/dL (ref 30.0–36.0)
MCV: 84.8 fL (ref 80.0–100.0)
Monocytes Absolute: 0.7 10*3/uL (ref 0.1–1.0)
Monocytes Relative: 8 %
Neutro Abs: 4.6 10*3/uL (ref 1.7–7.7)
Neutrophils Relative %: 57 %
Platelets: 241 10*3/uL (ref 150–400)
RBC: 5.61 MIL/uL (ref 4.22–5.81)
RDW: 13.1 % (ref 11.5–15.5)
WBC: 8 10*3/uL (ref 4.0–10.5)
nRBC: 0 % (ref 0.0–0.2)

## 2024-04-17 LAB — COMPREHENSIVE METABOLIC PANEL WITH GFR
ALT: 29 U/L (ref 0–44)
AST: 16 U/L (ref 15–41)
Albumin: 4.3 g/dL (ref 3.5–5.0)
Alkaline Phosphatase: 91 U/L (ref 38–126)
Anion gap: 12 (ref 5–15)
BUN: 22 mg/dL — ABNORMAL HIGH (ref 6–20)
CO2: 25 mmol/L (ref 22–32)
Calcium: 9.3 mg/dL (ref 8.9–10.3)
Chloride: 105 mmol/L (ref 98–111)
Creatinine, Ser: 0.82 mg/dL (ref 0.61–1.24)
GFR, Estimated: >60 mL/min (ref >60)
Glucose, Bld: 82 mg/dL (ref 70–99)
Potassium: 3.7 mmol/L (ref 3.5–5.1)
Sodium: 142 mmol/L (ref 135–145)
Total Bilirubin: 1.2 mg/dL (ref 0.0–1.2)
Total Protein: 6.3 g/dL — ABNORMAL LOW (ref 6.5–8.1)

## 2024-04-17 LAB — ETHANOL: Alcohol, Ethyl (B): <15 mg/dL (ref <15)

## 2024-04-17 MED ORDER — HALOPERIDOL LACTATE 5 MG/ML IJ SOLN: 10.0000 mg | Freq: Three times a day (TID) | INTRAMUSCULAR | Status: DC | PRN

## 2024-04-17 MED ORDER — MAGNESIUM HYDROXIDE 400 MG/5ML PO SUSP: 30.0000 mL | Freq: Every day | ORAL | Status: DC | PRN

## 2024-04-17 MED ORDER — ADULT MULTIVITAMIN W/MINERALS CH
1.0000 | ORAL_TABLET | Freq: Every day | ORAL | Status: DC
  Administered 2024-04-18 – 2024-04-22 (×5): 1 via ORAL
  Filled 2024-04-17 (×5): qty 1

## 2024-04-17 MED ORDER — TRAZODONE HCL 50 MG PO TABS
50.0000 mg | ORAL_TABLET | Freq: Every evening | ORAL | Status: DC | PRN
  Filled 2024-04-17 (×3): qty 1

## 2024-04-17 MED ORDER — DIPHENHYDRAMINE HCL 50 MG PO CAPS: 50.0000 mg | ORAL_CAPSULE | Freq: Three times a day (TID) | ORAL | Status: DC | PRN

## 2024-04-17 MED ORDER — LORAZEPAM 1 MG PO TABS: 1.0000 mg | ORAL_TABLET | Freq: Three times a day (TID) | ORAL | Status: DC

## 2024-04-17 MED ORDER — ONDANSETRON 4 MG PO TBDP: 4.0000 mg | ORAL_TABLET | Freq: Four times a day (QID) | ORAL | Status: AC | PRN

## 2024-04-17 MED ORDER — LORAZEPAM 1 MG PO TABS: 1.0000 mg | ORAL_TABLET | Freq: Two times a day (BID) | ORAL | Status: DC

## 2024-04-17 MED ORDER — LORAZEPAM 1 MG PO TABS
1.0000 mg | ORAL_TABLET | Freq: Four times a day (QID) | ORAL | Status: DC
  Administered 2024-04-17: 1 mg via ORAL
  Filled 2024-04-17 (×2): qty 1

## 2024-04-17 MED ORDER — LOPERAMIDE HCL 2 MG PO CAPS: 2.0000 mg | ORAL_CAPSULE | ORAL | Status: AC | PRN

## 2024-04-17 MED ORDER — LORAZEPAM 1 MG PO TABS: 1.0000 mg | ORAL_TABLET | Freq: Four times a day (QID) | ORAL | Status: AC | PRN

## 2024-04-17 MED ORDER — DIPHENHYDRAMINE HCL 50 MG/ML IJ SOLN
50.0000 mg | Freq: Three times a day (TID) | INTRAMUSCULAR | Status: DC | PRN
  Filled 2024-04-17: qty 1

## 2024-04-17 MED ORDER — DIPHENHYDRAMINE HCL 50 MG/ML IJ SOLN: 50.0000 mg | Freq: Three times a day (TID) | INTRAMUSCULAR | Status: DC | PRN

## 2024-04-17 MED ORDER — ALUM & MAG HYDROXIDE-SIMETH 200-200-20 MG/5ML PO SUSP: 30.0000 mL | ORAL | Status: DC | PRN

## 2024-04-17 MED ORDER — THIAMINE MONONITRATE 100 MG PO TABS
100.0000 mg | ORAL_TABLET | Freq: Every day | ORAL | Status: DC
  Administered 2024-04-18: 100 mg via ORAL
  Filled 2024-04-17: qty 1

## 2024-04-17 MED ORDER — THIAMINE HCL 100 MG/ML IJ SOLN
100.0000 mg | Freq: Once | INTRAMUSCULAR | Status: AC
  Administered 2024-04-17: 100 mg via INTRAMUSCULAR
  Filled 2024-04-17: qty 2

## 2024-04-17 MED ORDER — ACETAMINOPHEN 325 MG PO TABS: 650.0000 mg | ORAL_TABLET | Freq: Four times a day (QID) | ORAL | Status: DC | PRN

## 2024-04-17 MED ORDER — HALOPERIDOL 5 MG PO TABS: 5.0000 mg | ORAL_TABLET | Freq: Three times a day (TID) | ORAL | Status: DC | PRN

## 2024-04-17 MED ORDER — LORAZEPAM 2 MG/ML IJ SOLN: 2.0000 mg | Freq: Three times a day (TID) | INTRAMUSCULAR | Status: DC | PRN

## 2024-04-17 MED ORDER — HALOPERIDOL LACTATE 5 MG/ML IJ SOLN: 5.0000 mg | Freq: Three times a day (TID) | INTRAMUSCULAR | Status: DC | PRN

## 2024-04-17 MED ORDER — LORAZEPAM 1 MG PO TABS: 1.0000 mg | ORAL_TABLET | Freq: Every day | ORAL | Status: DC

## 2024-04-17 MED ORDER — HYDROXYZINE HCL 25 MG PO TABS
25.0000 mg | ORAL_TABLET | Freq: Four times a day (QID) | ORAL | Status: AC | PRN
  Administered 2024-04-19: 25 mg via ORAL
  Filled 2024-04-17: qty 1
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
## 2024-04-17 NOTE — ED Notes (Addendum)
Pt left without being triaged or seen

## 2024-04-17 NOTE — ED Notes (Signed)
 Report given to Latricia, RN at [facility].
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
## 2024-04-18 ENCOUNTER — Encounter (HOSPITAL_COMMUNITY): Payer: Self-pay | Admitting: Nurse Practitioner

## 2024-04-18 ENCOUNTER — Other Ambulatory Visit: Payer: Self-pay

## 2024-04-18 DIAGNOSIS — F17210 Nicotine dependence, cigarettes, uncomplicated: Secondary | ICD-10-CM | POA: Diagnosis not present

## 2024-04-18 DIAGNOSIS — F17200 Nicotine dependence, unspecified, uncomplicated: Secondary | ICD-10-CM

## 2024-04-18 DIAGNOSIS — F1020 Alcohol dependence, uncomplicated: Secondary | ICD-10-CM | POA: Diagnosis not present

## 2024-04-18 DIAGNOSIS — F322 Major depressive disorder, single episode, severe without psychotic features: Secondary | ICD-10-CM

## 2024-04-18 DIAGNOSIS — F1994 Other psychoactive substance use, unspecified with psychoactive substance-induced mood disorder: Secondary | ICD-10-CM

## 2024-04-18 DIAGNOSIS — Z8659 Personal history of other mental and behavioral disorders: Secondary | ICD-10-CM

## 2024-04-18 DIAGNOSIS — F1491 Cocaine use, unspecified, in remission: Secondary | ICD-10-CM | POA: Diagnosis not present

## 2024-04-18 DIAGNOSIS — Z9151 Personal history of suicidal behavior: Secondary | ICD-10-CM

## 2024-04-18 DIAGNOSIS — F325 Major depressive disorder, single episode, in full remission: Secondary | ICD-10-CM | POA: Diagnosis not present

## 2024-04-18 HISTORY — DX: Major depressive disorder, single episode, severe without psychotic features: F32.2

## 2024-04-18 HISTORY — DX: Cocaine use, unspecified, in remission: F14.91

## 2024-04-18 HISTORY — DX: Nicotine dependence, unspecified, uncomplicated: F17.200

## 2024-04-18 HISTORY — DX: Other psychoactive substance use, unspecified with psychoactive substance-induced mood disorder: F19.94

## 2024-04-18 MED ORDER — LORAZEPAM 0.5 MG PO TABS: 0.5000 mg | ORAL_TABLET | Freq: Two times a day (BID) | ORAL | Status: DC

## 2024-04-18 MED ORDER — THIAMINE MONONITRATE 100 MG PO TABS
100.0000 mg | ORAL_TABLET | Freq: Every day | ORAL | Status: DC
  Administered 2024-04-18 – 2024-04-21 (×4): 100 mg via ORAL
  Filled 2024-04-18 (×4): qty 1

## 2024-04-18 MED ORDER — NALTREXONE HCL 50 MG PO TABS: 50.0000 mg | ORAL_TABLET | Freq: Every day | ORAL | Status: DC

## 2024-04-18 MED ORDER — GABAPENTIN 300 MG PO CAPS: 300.0000 mg | ORAL_CAPSULE | Freq: Two times a day (BID) | ORAL | Status: DC | PRN

## 2024-04-18 MED ORDER — NALTREXONE HCL 50 MG PO TABS
50.0000 mg | ORAL_TABLET | Freq: Every day | ORAL | Status: DC
  Administered 2024-04-19 – 2024-04-21 (×2): 50 mg via ORAL
  Filled 2024-04-18 (×3): qty 1

## 2024-04-18 MED ORDER — CHLORDIAZEPOXIDE HCL 25 MG PO CAPS
25.0000 mg | ORAL_CAPSULE | Freq: Two times a day (BID) | ORAL | Status: AC
  Administered 2024-04-18 – 2024-04-19 (×3): 25 mg via ORAL
  Filled 2024-04-18 (×3): qty 1

## 2024-04-18 MED ORDER — NICOTINE POLACRILEX 2 MG MT GUM
4.0000 mg | CHEWING_GUM | OROMUCOSAL | Status: DC | PRN
  Administered 2024-04-18 – 2024-04-22 (×5): 4 mg via ORAL
  Filled 2024-04-18 (×6): qty 2

## 2024-04-18 MED ORDER — LORAZEPAM 1 MG PO TABS: 1.0000 mg | ORAL_TABLET | Freq: Three times a day (TID) | ORAL | Status: DC

## 2024-04-18 MED ORDER — LORAZEPAM 0.5 MG PO TABS: 0.5000 mg | ORAL_TABLET | Freq: Every day | ORAL | Status: DC

## 2024-04-18 MED ORDER — NICOTINE 21 MG/24HR TD PT24
21.0000 mg | MEDICATED_PATCH | Freq: Every day | TRANSDERMAL | Status: DC
  Administered 2024-04-19: 21 mg via TRANSDERMAL
  Filled 2024-04-18 (×5): qty 1

## 2024-04-18 MED ORDER — GABAPENTIN 300 MG PO CAPS: 300.0000 mg | ORAL_CAPSULE | Freq: Three times a day (TID) | ORAL | Status: DC | PRN

## 2024-04-18 MED ORDER — NALTREXONE HCL 50 MG PO TABS
25.0000 mg | ORAL_TABLET | Freq: Every day | ORAL | Status: AC
  Administered 2024-04-18: 25 mg via ORAL
  Filled 2024-04-18: qty 1

## 2024-04-18 MED ORDER — LORAZEPAM 1 MG PO TABS
1.0000 mg | ORAL_TABLET | Freq: Four times a day (QID) | ORAL | Status: DC
  Administered 2024-04-18: 1 mg via ORAL

## 2024-04-18 NOTE — Group Note (Signed)
 Group Topic: Feelings about Diagnosis  Group Date: 04/18/2024 Start Time: 0900 End Time: 1000 Facilitators: Britain Anagnos, [place], RN  Department: [facility]  Number of Participants: 6  Group Focus: substance abuse education Treatment Modality:  Individual Therapy Interventions utilized were patient education Purpose: reasons for being medication adherent  Name: REGINALDO BRINTON Date of Birth: 08/22/1981  MR: [id number]    Level of Participation: moderate Quality of Participation: engaged Interactions with others: gave feedback Mood/Affect: appropriate Triggers (if applicable): life Cognition: goal directed Progress: Gaining insight Response: "I am going to try" Plan: follow-up needed  Patients Problems:  Patient Active Problem List   Diagnosis Date Noted   Alcohol use disorder, severe, dependence (HCC) 04/18/2024   History of suicide attempt 04/18/2024   History of admission to inpatient psychiatry department 04/18/2024   Substance induced mood disorder (HCC) 04/18/2024   Tobacco use disorder 04/18/2024   Cocaine use disorder in remission 04/18/2024   Alcohol use disorder 04/17/2024   Noninfectious gastroenteritis 10/21/2023   Adjustment disorder with mixed anxiety and depressed mood 04/06/2016

## 2024-04-18 NOTE — ED Notes (Signed)
 Pt A&O x 4, presents seeking alcohol detox, pt pending bed at [facility] on Monday.  Pt admits to ingesting 24 Beers and and 2 bottles of Tequila.  Pt calm & cooperative at present, no distress noted.  Passive SI, no plan noted. Comfort measures given.  Monitoring for safety.

## 2024-04-18 NOTE — ED Notes (Signed)
 Patient asleep at change of shift. Currently patient is in his room resting, clam and cooperative, Pt is here for alcohol abuse  after consuming a 24pk of beer and full bottle of tequila 2 days ago. severe depression, hopelessness r/t life stressors. He denies si/hi/avh at this time. Medication education provided during med pass and he offered no concerns. Visible at intervals on unit and keeps behavior controlled. Will continue to monitor and report changes as noted.
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
## 2024-04-18 NOTE — ED Notes (Signed)
 Pt sleeping at present, no distress noted, monitoring for safety.

## 2024-04-19 DIAGNOSIS — F17210 Nicotine dependence, cigarettes, uncomplicated: Secondary | ICD-10-CM | POA: Diagnosis not present

## 2024-04-19 DIAGNOSIS — F1020 Alcohol dependence, uncomplicated: Secondary | ICD-10-CM | POA: Diagnosis not present

## 2024-04-19 DIAGNOSIS — F1491 Cocaine use, unspecified, in remission: Secondary | ICD-10-CM | POA: Diagnosis not present

## 2024-04-19 DIAGNOSIS — F325 Major depressive disorder, single episode, in full remission: Secondary | ICD-10-CM | POA: Diagnosis not present

## 2024-04-19 MED ORDER — POLYETHYLENE GLYCOL 3350 17 G PO PACK
17.0000 g | PACK | Freq: Two times a day (BID) | ORAL | Status: AC
  Administered 2024-04-19: 17 g via ORAL
  Filled 2024-04-19: qty 1

## 2024-04-19 MED ORDER — POLYETHYLENE GLYCOL 3350 17 G PO PACK: 17.0000 g | PACK | Freq: Every day | ORAL | Status: DC | PRN

## 2024-04-19 MED ORDER — SENNA 8.6 MG PO TABS
1.0000 | ORAL_TABLET | Freq: Every day | ORAL | Status: AC
  Administered 2024-04-19: 8.6 mg via ORAL
  Filled 2024-04-19: qty 1

## 2024-04-19 MED ORDER — SENNA 8.6 MG PO TABS
1.0000 | ORAL_TABLET | Freq: Every day | ORAL | Status: DC | PRN
Start: 2024-04-19 — End: 2024-04-22

## 2024-04-19 NOTE — Group Note (Signed)
 Group Topic: Social Support  Group Date: 04/19/2024 Start Time: 1930 End Time: 2000 Facilitators: Wendall Halls B  Department: [facility]  Number of Participants: 5  Group Focus: abuse issues, acceptance, activities of daily living skills, check in, coping skills, daily focus, goals/reality orientation, personal responsibility, problem solving, self-esteem, and social skills Treatment Modality:  Individual Therapy Interventions utilized were leisure development and support Purpose: enhance coping skills, express feelings, increase insight, and relapse prevention strategies  Name: [name] Date of Birth: 1981/11/05  MR: [id number]    Level of Participation: active Quality of Participation: cooperative Interactions with others: gave feedback Mood/Affect: appropriate, brightens with interaction, and positive Triggers (if applicable): NA Cognition: coherent/clear Progress: Gaining insight Response: NA Plan: patient will be encouraged to keep going to groups.   Patients Problems:  Patient Active Problem List   Diagnosis Date Noted   Alcohol use disorder, severe, dependence (HCC) 04/18/2024   History of suicide attempt 04/18/2024   History of admission to inpatient psychiatry department 04/18/2024   Substance induced mood disorder (HCC) 04/18/2024   Tobacco use disorder 04/18/2024   Cocaine use disorder in remission 04/18/2024   Alcohol use disorder 04/17/2024   Noninfectious gastroenteritis 10/21/2023   Adjustment disorder with mixed anxiety and depressed mood 04/06/2016

## 2024-04-19 NOTE — ED Notes (Signed)
 Patient was provided lunch

## 2024-04-19 NOTE — ED Notes (Signed)
 Patient was provided breakfast

## 2024-04-19 NOTE — ED Provider Notes (Signed)
 Behavioral Health Progress Note  Date and Time: 04/19/2024 1:57 PM Name: [name] MRN:  [id number] CC: etoh detox  Subjective:  [name] is a 43 y.o. male, with AUD, nicotine use d/o, SIMD, MDD in remission, remote suicide attempt and inpatient psych admissions, who admitted to [facility] (04/17/2024) from [facility] the same day for etoh detox for daily drinking since 08/2023 after job loss and homelessness Last drink was 04/16/2024 First time detox  Mood: ("not bad") Sleep:Good Appetite: Good SI:No (last time passive SI was 04/16/2024) HI:No KGM:WNUU  Withdraws: had some tremors and diaphoresis last night, none this morning Cravings: etoh craving last night Med side effects: denied  Tolerating new meds well, amenable to increasing them per below.   Review of Systems  Constitutional:  Negative for malaise/fatigue.  Respiratory:  Negative for shortness of breath.   Cardiovascular:  Negative for chest pain.  Gastrointestinal:  Positive for abdominal pain and constipation. Negative for nausea and vomiting.  Musculoskeletal:        Arm soreness  Neurological:  Negative for dizziness, tremors, speech change, focal weakness, weakness and headaches.     Diagnosis:  Final diagnoses:  Alcohol use disorder, severe, dependence (HCC)  History of suicide attempt  History of admission to inpatient psychiatry department  Tobacco use disorder  Cocaine use disorder in remission    Past Psychiatric History: See H&P Past Medical History: See H&P Family History: See H&P Family Psychiatric  History: See H&P Social History: See H&P  Total Time spent with patient: 30 minutes  Additional Social History:                         Current Medications:  Current Facility-Administered Medications  Medication Dose Route Frequency Provider Last Rate Last Admin   acetaminophen  (TYLENOL ) tablet 650 mg  650 mg Oral Q6H PRN Bobbitt, Shalon E, NP       alum & mag hydroxide-simeth  (MAALOX/MYLANTA) 200-200-20 MG/5ML suspension 30 mL  30 mL Oral Q4H PRN Bobbitt, Shalon E, NP       haloperidol (HALDOL) tablet 5 mg  5 mg Oral TID PRN Bobbitt, Shalon E, NP       And   diphenhydrAMINE  (BENADRYL ) capsule 50 mg  50 mg Oral TID PRN Bobbitt, Shalon E, NP       haloperidol lactate (HALDOL) injection 5 mg  5 mg Intramuscular TID PRN Bobbitt, Shalon E, NP       And   diphenhydrAMINE  (BENADRYL ) injection 50 mg  50 mg Intramuscular TID PRN Bobbitt, Shalon E, NP       And   LORazepam  (ATIVAN ) injection 2 mg  2 mg Intramuscular TID PRN Bobbitt, Shalon E, NP       haloperidol lactate (HALDOL) injection 10 mg  10 mg Intramuscular TID PRN Bobbitt, Shalon E, NP       And   diphenhydrAMINE  (BENADRYL ) injection 50 mg  50 mg Intramuscular TID PRN Bobbitt, Shalon E, NP       And   LORazepam  (ATIVAN ) injection 2 mg  2 mg Intramuscular TID PRN Bobbitt, Shalon E, NP       gabapentin (NEURONTIN) capsule 300 mg  300 mg Oral BID PRN Shanise Balch, DO       hydrOXYzine  (ATARAX ) tablet 25 mg  25 mg Oral Q6H PRN Bobbitt, Shalon E, NP       loperamide  (IMODIUM ) capsule 2-4 mg  2-4 mg Oral PRN Bobbitt, Shalon E, NP  LORazepam  (ATIVAN ) tablet 1 mg  1 mg Oral Q6H PRN Bobbitt, Shalon E, NP       magnesium  hydroxide (MILK OF MAGNESIA) suspension 30 mL  30 mL Oral Daily PRN Bobbitt, Shalon E, NP       multivitamin with minerals tablet 1 tablet  1 tablet Oral Daily Bobbitt, Shalon E, NP   1 tablet at 04/19/24 0933   naltrexone (DEPADE) tablet 50 mg  50 mg Oral QHS Haadi Santellan, DO       nicotine (NICODERM CQ - dosed in mg/24 hours) patch 21 mg  21 mg Transdermal Q0600 Blanca Carreon, DO   21 mg at 04/19/24 0625   nicotine polacrilex (NICORETTE) gum 4 mg  4 mg Oral PRN Rogina Schiano, DO   4 mg at 04/18/24 1415   ondansetron  (ZOFRAN -ODT) disintegrating tablet 4 mg  4 mg Oral Q6H PRN Bobbitt, Shalon E, NP       polyethylene glycol (MIRALAX  / GLYCOLAX ) packet 17 g  17 g Oral BID Maisie Hauser, DO   17 g at  04/19/24 4098   And   polyethylene glycol (MIRALAX  / GLYCOLAX ) packet 17 g  17 g Oral Daily PRN Denali Sharma, DO       senna (SENOKOT) tablet 8.6 mg  1 tablet Oral Daily PRN Piero Mustard, DO       thiamine  (VITAMIN B1) tablet 100 mg  100 mg Oral QHS Kaesen Rodriguez, DO   100 mg at 04/18/24 2124   traZODone  (DESYREL ) tablet 50 mg  50 mg Oral QHS PRN Bobbitt, Shalon E, NP       No current outpatient medications on file.    Labs  Lab Results:  Admission on 04/17/2024, Discharged on 04/17/2024  Component Date Value Ref Range Status   WBC 04/17/2024 8.0  4.0 - 10.5 K/uL Final   RBC 04/17/2024 5.61  4.22 - 5.81 MIL/uL Final   Hemoglobin 04/17/2024 16.4  13.0 - 17.0 g/dL Final   HCT 11/91/4782 47.6  39.0 - 52.0 % Final   MCV 04/17/2024 84.8  80.0 - 100.0 fL Final   MCH 04/17/2024 29.2  26.0 - 34.0 pg Final   MCHC 04/17/2024 34.5  30.0 - 36.0 g/dL Final   RDW 95/62/1308 13.1  11.5 - 15.5 % Final   Platelets 04/17/2024 241  150 - 400 K/uL Final   nRBC 04/17/2024 0.0  0.0 - 0.2 % Final   Neutrophils Relative % 04/17/2024 57  % Final   Neutro Abs 04/17/2024 4.6  1.7 - 7.7 K/uL Final   Lymphocytes Relative 04/17/2024 31  % Final   Lymphs Abs 04/17/2024 2.5  0.7 - 4.0 K/uL Final   Monocytes Relative 04/17/2024 8  % Final   Monocytes Absolute 04/17/2024 0.7  0.1 - 1.0 K/uL Final   Eosinophils Relative 04/17/2024 2  % Final   Eosinophils Absolute 04/17/2024 0.2  0.0 - 0.5 K/uL Final   Basophils Relative 04/17/2024 1  % Final   Basophils Absolute 04/17/2024 0.1  0.0 - 0.1 K/uL Final   Immature Granulocytes 04/17/2024 1  % Final   Abs Immature Granulocytes 04/17/2024 0.05  0.00 - 0.07 K/uL Final   Performed at [facility], [address] [place], [place] [address]   Sodium 04/17/2024 142  135 - 145 mmol/L Final   Potassium 04/17/2024 3.7  3.5 - 5.1 mmol/L Final   Chloride 04/17/2024 105  98 - 111 mmol/L Final   CO2 04/17/2024 25  22 - 32 mmol/L  Final   Glucose, Bld 04/17/2024 82  70 -  99 mg/dL Final   Glucose reference range applies only to samples taken after fasting for at least 8 hours.   BUN 04/17/2024 22 (H)  6 - 20 mg/dL Final   Creatinine, Ser 04/17/2024 0.82  0.61 - 1.24 mg/dL Final   Calcium 16/09/9603 9.3  8.9 - 10.3 mg/dL Final   Total Protein 54/08/8118 6.3 (L)  6.5 - 8.1 g/dL Final   Albumin 14/78/2956 4.3  3.5 - 5.0 g/dL Final   AST 21/30/8657 16  15 - 41 U/L Final   ALT 04/17/2024 29  0 - 44 U/L Final   Alkaline Phosphatase 04/17/2024 91  38 - 126 U/L Final   Total Bilirubin 04/17/2024 1.2  0.0 - 1.2 mg/dL Final   GFR, Estimated 04/17/2024 >60  >60 mL/min Final   Comment: (NOTE) Calculated using the CKD-EPI Creatinine Equation (2021)    Anion gap 04/17/2024 12  5 - 15 Final   Performed at [facility], [address], [place], [place] [address]   POC Amphetamine UR 04/17/2024 None Detected  NONE DETECTED (Cut Off Level 1000 ng/mL) Final   POC Secobarbital (BAR) 04/17/2024 None Detected  NONE DETECTED (Cut Off Level 300 ng/mL) Final   POC Buprenorphine (BUP) 04/17/2024 None Detected  NONE DETECTED (Cut Off Level 10 ng/mL) Final   POC Oxazepam (BZO) 04/17/2024 None Detected  NONE DETECTED (Cut Off Level 300 ng/mL) Final   POC Cocaine UR 04/17/2024 None Detected  NONE DETECTED (Cut Off Level 300 ng/mL) Final   POC Methamphetamine UR 04/17/2024 None Detected  NONE DETECTED (Cut Off Level 1000 ng/mL) Final   POC Morphine  04/17/2024 None Detected  NONE DETECTED (Cut Off Level 300 ng/mL) Final   POC Methadone UR 04/17/2024 None Detected  NONE DETECTED (Cut Off Level 300 ng/mL) Final   POC Oxycodone UR 04/17/2024 None Detected  NONE DETECTED (Cut Off Level 100 ng/mL) Final   POC Marijuana UR 04/17/2024 None Detected  NONE DETECTED (Cut Off Level 50 ng/mL) Final   Alcohol, Ethyl (B) 04/17/2024 <15  <15 mg/dL Final   Comment: Please note change in reference range. (NOTE) For medical purposes only. Performed at [facility], [address],  [place], [place] [address]   Admission on 01/03/2024, Discharged on 01/03/2024  Component Date Value Ref Range Status   SARS Coronavirus 2 by RT PCR 01/03/2024 NEGATIVE  NEGATIVE Final   Comment: (NOTE) SARS-CoV-2 target nucleic acids are NOT DETECTED.  The SARS-CoV-2 RNA is generally detectable in upper respiratory specimens during the acute phase of infection. The lowest concentration of SARS-CoV-2 viral copies this assay can detect is 138 copies/mL. A negative result does not preclude SARS-Cov-2 infection and should not be used as the sole basis for treatment or other patient management decisions. A negative result may occur with  improper specimen collection/handling, submission of specimen other than nasopharyngeal swab, presence of viral mutation(s) within the areas targeted by this assay, and inadequate number of viral copies(<138 copies/mL). A negative result must be combined with clinical observations, patient history, and epidemiological information. The expected result is Negative.  Fact Sheet for Patients:  BloggerCourse.com  Fact Sheet for Healthcare Providers:  SeriousBroker.it  This test is no                          t yet approved or cleared by the [place]  FDA and  has been authorized  for detection and/or diagnosis of SARS-CoV-2 by FDA under an Emergency Use Authorization (EUA). This EUA will remain  in effect (meaning this test can be used) for the duration of the COVID-19 declaration under Section 564(b)(1) of the Act, 21 U.S.C.section 360bbb-3(b)(1), unless the authorization is terminated  or revoked sooner.       Influenza A by PCR 01/03/2024 NEGATIVE  NEGATIVE Final   Influenza B by PCR 01/03/2024 NEGATIVE  NEGATIVE Final   Comment: (NOTE) The Xpert Xpress SARS-CoV-2/FLU/RSV plus assay is intended as an aid in the diagnosis of influenza from Nasopharyngeal swab specimens and should not be used as a sole  basis for treatment. Nasal washings and aspirates are unacceptable for Xpert Xpress SARS-CoV-2/FLU/RSV testing.  Fact Sheet for Patients: BloggerCourse.com  Fact Sheet for Healthcare Providers: SeriousBroker.it  This test is not yet approved or cleared by the [place]  FDA and has been authorized for detection and/or diagnosis of SARS-CoV-2 by FDA under an Emergency Use Authorization (EUA). This EUA will remain in effect (meaning this test can be used) for the duration of the COVID-19 declaration under Section 564(b)(1) of the Act, 21 U.S.C. section 360bbb-3(b)(1), unless the authorization is terminated or revoked.     Resp Syncytial Virus by PCR 01/03/2024 POSITIVE (A)  NEGATIVE Final   Comment: (NOTE) Fact Sheet for Patients: BloggerCourse.com  Fact Sheet for Healthcare Providers: SeriousBroker.it  This test is not yet approved or cleared by the [place]  FDA and has been authorized for detection and/or diagnosis of SARS-CoV-2 by FDA under an Emergency Use Authorization (EUA). This EUA will remain in effect (meaning this test can be used) for the duration of the COVID-19 declaration under Section 564(b)(1) of the Act, 21 U.S.C. section 360bbb-3(b)(1), unless the authorization is terminated or revoked.  Performed at [facility], [address] [place], [place] [address]     Blood Alcohol level:  Lab Results  Component Value Date   [facility] <15 04/17/2024   ETH <10 05/03/2019    Metabolic Disorder Labs: No results found for: "HGBA1C", "MPG" No results found for: "PROLACTIN" Lab Results  Component Value Date   CHOL 175 04/05/2016   TRIG 86 04/05/2016   HDL 37 (L) 04/05/2016   CHOLHDL 4.7 04/05/2016   VLDL 17 04/05/2016   LDLCALC 121 (H) 04/05/2016    Therapeutic Lab Levels: No results found for: "LITHIUM" No results found for: "VALPROATE" No results found  for: "CBMZ"  Physical Findings   AIMS    Flowsheet Row [facility]  AIMS Total Score 0      AUDIT    Flowsheet Row [facility]  Alcohol Use Disorder Identification Test Final Score (AUDIT) 5      PHQ2-9    Flowsheet Row ED from 04/17/2024 in [facility]  PHQ-2 Total Score 2  PHQ-9 Total Score 2      Flowsheet Row ED from 04/17/2024 in [facility] Most recent reading at 04/18/2024 12:21 AM ED from 04/17/2024 in [facility] Most recent reading at 04/17/2024  4:23 PM ED from 01/03/2024 in [facility] Emergency Department at [facility] Most recent reading at 01/03/2024  2:00 PM  C-SSRS RISK CATEGORY No Risk No Risk No Risk        Musculoskeletal  Strength & Muscle Tone: within normal limited Gait & Station: normal Patient leans: NA  Psychiatric Specialty Exam  Presentation General Appearance:Appropriate for Environment, Fairly Groomed (pleasant, engaged) Eye Contact:Good Speech:Clear and Coherent, Normal Rate (spontaneous) Volume:Normal Handedness:Right  Mood and Affect  Mood: ("not bad") Affect:Appropriate, Congruent, Full Range (anxious appearing at times)  Thought Process  Thought Process:Coherent, Goal Directed, Linear Descriptions of Associations:Intact  Thought Content Suicidal Thoughts:No (last time passive SI was 04/16/2024) Homicidal Thoughts:No Hallucinations:None Ideas of Reference:None Thought Content:WDL, Logical  Sensorium  Memory:Immediate Good, Recent Good Judgment:Good Insight:Good  Executive Functions  Orientation:Full (Time, Place and Person) Language:Good Concentration:Good Attention:Good Recall:Good Fund of Knowledge:Good  Psychomotor Activity  Psychomotor Activity:Psychomotor Activity:  Normal  Assets  Assets:Communication Skills, Desire for Improvement, Leisure Time, Physical Health, Resilience  Sleep  Quality:Good  Physical Exam  Physical Exam Vitals and nursing note reviewed.  Constitutional:      General: He is not in acute distress.    Appearance: Normal appearance. He is not ill-appearing, toxic-appearing or diaphoretic.  HENT:     Head: Normocephalic and atraumatic.     Nose: No congestion.  Eyes:     Pupils: Pupils are equal, round, and reactive to light.     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 No respiratory distress.  Musculoskeletal:        General: Tenderness present. No deformity or signs of injury. Normal range of motion.     Right lower leg: No edema.     Left lower leg: No edema.     Comments: Mild swelling and tenderness at the site of B1 injection. Hurt more after putting on nic patch on that side. Better when removed it. No redness, bleeding, bruising, wounds.  Neurological:     General: No focal deficit present.     Mental Status: He is alert and oriented to person, place, and time.     Gait: Gait normal.    Blood pressure 118/84, pulse 77, temperature 97.8 F (36.6 C), temperature source Oral, resp. rate 18, SpO2 96%. There is no height or weight on file to calculate BMI.  Treatment Plan Summary: Daily contact with patient to assess and evaluate symptoms and progress in treatment and Medication management Principal Problem:   Alcohol use disorder, severe, dependence (HCC) Active Problems:   History of suicide attempt   History of admission to inpatient psychiatry department   Substance induced mood disorder (HCC)   Tobacco use disorder   Cocaine use disorder in remission  JERMERE MESCHKE is a 43 y.o. male with AUD, nicotine use d/o, SIMD, MDD in remission, remote suicide attempt and inpatient psych admissions, who admitted to [facility] (04/17/2024) from [facility] the same day for  etoh detox for daily drinking since 08/2023 after job loss and homelessness Last drink was 04/16/2024 First time detox   Still experiencing some withdrawal sxs, tremors and diaphoresis last night, but none this AM. Tolerated new rx well, increasing per below.  This morning started having cramping abdominal pain when he woke up, described as feeling he needs to have a BM, but he doesn't have the urge to currently. He's only had 1 BM since admission and he's usually regular daily. I suspect constipation, so started him on bowel regimen per below to help his bowels.  Also his arm was hurting him on the side of the B1 shot, also same arm as nic patch. Instructed to switch arms which seems to help, nothing else to do unless he mentions it.  # AUD w dependence (  no sz or DT) # SIMD INCREASED naltrexone 25 mg to 50 mg qPM (i5/03/2024) Continued librium taper (ends 04/19/2024) Continued gabapentin 300 mg BID PRN  Continued thiamine  supplement Continued CIWA + PRNs per protocol   # Tobacco use d/o NRT  Consider wellbutrin SR vs chantix - patient amenable  # Constipation Instructed to walk, drink fluids, eat fibers STARTED miralax  BID x1 day then change to PRN STARTED senna x1 dose then change to PRN  DISPO:  Tentative date: 5/4 Location: [facility], called already, stating that he has available bed Monday Follow-up/Recs:   PRNs: acetaminophen , 650 mg, Q6H PRN alum & mag hydroxide-simeth, 30 mL, Q4H PRN haloperidol, 5 mg, TID PRN  And diphenhydrAMINE , 50 mg, TID PRN haloperidol lactate, 5 mg, TID PRN  And diphenhydrAMINE , 50 mg, TID PRN  And LORazepam , 2 mg, TID PRN haloperidol lactate, 10 mg, TID PRN  And diphenhydrAMINE , 50 mg, TID PRN  And LORazepam , 2 mg, TID PRN gabapentin, 300 mg, BID PRN hydrOXYzine , 25 mg, Q6H PRN loperamide , 2-4 mg, PRN LORazepam , 1 mg, Q6H PRN magnesium  hydroxide, 30 mL, Daily PRN nicotine polacrilex, 4 mg, PRN ondansetron , 4 mg, Q6H  PRN polyethylene glycol, 17 g, Daily PRN senna, 1 tablet, Daily PRN traZODone , 50 mg, QHS PRN    Clinical Course as of 04/19/24 1357  Sat Apr 18, 2024  0903 EKG 04/17/2024 Vent. rate 69 BPM QT/QTcB 382/409 ms Normal sinus rhythm with sinus arrhythmia Normal ECG When compared with ECG of 14-May-2023 13:13, PREVIOUS ECG IS PRESENT [JN]    Clinical Course User Index [JN] Malikhi Ogan, DO    Traniece Boffa, DO Psych Resident, PGY-3 04/19/2024 1:57 PM

## 2024-04-19 NOTE — Group Note (Signed)
 Group Topic: Social Support  Group Date: 04/19/2024 Start Time: 1230 End Time: 1330 Facilitators: Arlan Belling, RN  Department: [facility]  Number of Participants: 8  Group Focus: community group, problem solving, and social skills Treatment Modality:  Skills Training Interventions utilized were group exercise, leisure development, and problem solving Purpose: enhance coping skills, improve communication skills, and reinforce self-care  Name: [name] Date of Birth: Feb 05, 1981  MR: [id number]    Level of Participation: did not attend Quality of Participation:  Interactions with others:  Mood/Affect:  Triggers (if applicable):  Cognition:  Progress:  Response:  Plan:   Patients Problems:  Patient Active Problem List   Diagnosis Date Noted   Alcohol use disorder, severe, dependence (HCC) 04/18/2024   History of suicide attempt 04/18/2024   History of admission to inpatient psychiatry department 04/18/2024   Substance induced mood disorder (HCC) 04/18/2024   Tobacco use disorder 04/18/2024   Cocaine use disorder in remission 04/18/2024   Alcohol use disorder 04/17/2024   Noninfectious gastroenteritis 10/21/2023   Adjustment disorder with mixed anxiety and depressed mood 04/06/2016

## 2024-04-19 NOTE — ED Notes (Signed)
 Patient is asleep in bed without issue or complaint.  No withdrawal at this time.  Will monitor.

## 2024-04-19 NOTE — ED Notes (Signed)
 Pt is in the dayroom calm and pleasant watching TV with other patients. NAD, denies needing anything atm. Respirations even and unlabored. Will continue to monitor for safety.

## 2024-04-19 NOTE — Group Note (Signed)
 Group Topic: Relaxation  Group Date: 04/19/2024 Start Time: 1245 End Time: 1330 Facilitators: Rozetta Corns  Department: [facility]  Number of Participants: 3  Group Focus: relaxation Treatment Modality:  Psychoeducation Interventions utilized were patient education Purpose: increase insight  Name: [name] Date of Birth: 06/09/1981  MR: [id number]    Level of Participation: Patient did not attend group Quality of Participation: Patient did not attend group Interactions with others: N/A Mood/Affect: N/A Triggers (if applicable): N/A Cognition: N/A Progress: Other Response: N/A Plan: follow-up needed  Patients Problems:  Patient Active Problem List   Diagnosis Date Noted   Alcohol use disorder, severe, dependence (HCC) 04/18/2024   History of suicide attempt 04/18/2024   History of admission to inpatient psychiatry department 04/18/2024   Substance induced mood disorder (HCC) 04/18/2024   Tobacco use disorder 04/18/2024   Cocaine use disorder in remission 04/18/2024   Alcohol use disorder 04/17/2024   Noninfectious gastroenteritis 10/21/2023   Adjustment disorder with mixed anxiety and depressed mood 04/06/2016

## 2024-04-19 NOTE — ED Notes (Signed)
 Patient was provided dinner

## 2024-04-20 DIAGNOSIS — F325 Major depressive disorder, single episode, in full remission: Secondary | ICD-10-CM | POA: Diagnosis not present

## 2024-04-20 DIAGNOSIS — F1020 Alcohol dependence, uncomplicated: Secondary | ICD-10-CM | POA: Diagnosis not present

## 2024-04-20 DIAGNOSIS — F17210 Nicotine dependence, cigarettes, uncomplicated: Secondary | ICD-10-CM | POA: Diagnosis not present

## 2024-04-20 DIAGNOSIS — F1491 Cocaine use, unspecified, in remission: Secondary | ICD-10-CM | POA: Diagnosis not present

## 2024-04-20 NOTE — ED Notes (Signed)
 Pt calm and sleeping in the bedroom, NAD.  Will monitor for safety.

## 2024-04-20 NOTE — ED Provider Notes (Signed)
 Behavioral Health Progress Note  Date and Time: 04/20/2024 2:44 PM Name: [name] MRN:  [id number] CC: etoh detox  Subjective:  [name] is a 43 y.o. male, with AUD, nicotine use d/o, SIMD, MDD in remission, remote suicide attempt and inpatient psych admissions, who admitted to [facility] (04/17/2024) from [facility] the same day for etoh detox for daily drinking since 08/2023 after job loss and homelessness Last drink was 04/16/2024 First time detox  Chart reviewed, case discussed with RN and Child psychotherapist, patient seen today during rounds.  Patient reports that he is here because he has an "drinking problem".  Patient is hopeful to get into [place] clearing facility this week.  He was encouraged to discuss this with Child psychotherapist.  Patient is hoping to be discharged tomorrow.  Patient denies side effect from medicine.  He denies any withdrawal symptoms today.  He does complain of bodyaches.  Patient was encouraged to take as needed medicine as needed.  He was encouraged to continue attending groups and work on sobriety and coping strategies.  Today patient denies thoughts of harming himself or others.  He denies psychotic or manic symptoms.  Review of Systems  Constitutional:  Negative for malaise/fatigue.  Respiratory:  Negative for shortness of breath.   Cardiovascular:  Negative for chest pain.  Gastrointestinal:  Negative for abdominal pain, nausea and vomiting.  Musculoskeletal:        Arm soreness  Neurological:  Negative for dizziness, tremors, speech change, focal weakness, weakness and headaches.     Diagnosis:  Final diagnoses:  Alcohol use disorder, severe, dependence (HCC)  History of suicide attempt  History of admission to inpatient psychiatry department  Tobacco use disorder  Cocaine use disorder in remission    Past Psychiatric History: See H&P Past Medical History: See H&P Family History: See H&P Family Psychiatric  History: See H&P Social History: See H&P             Current Medications:  Current Facility-Administered Medications  Medication Dose Route Frequency Provider Last Rate Last Admin   acetaminophen  (TYLENOL ) tablet 650 mg  650 mg Oral Q6H PRN Bobbitt, Shalon E, NP       alum & mag hydroxide-simeth (MAALOX/MYLANTA) 200-200-20 MG/5ML suspension 30 mL  30 mL Oral Q4H PRN Bobbitt, Shalon E, NP       haloperidol (HALDOL) tablet 5 mg  5 mg Oral TID PRN Bobbitt, Shalon E, NP       And   diphenhydrAMINE  (BENADRYL ) capsule 50 mg  50 mg Oral TID PRN Bobbitt, Shalon E, NP       haloperidol lactate (HALDOL) injection 5 mg  5 mg Intramuscular TID PRN Bobbitt, Shalon E, NP       And   diphenhydrAMINE  (BENADRYL ) injection 50 mg  50 mg Intramuscular TID PRN Bobbitt, Shalon E, NP       And   LORazepam  (ATIVAN ) injection 2 mg  2 mg Intramuscular TID PRN Bobbitt, Shalon E, NP       haloperidol lactate (HALDOL) injection 10 mg  10 mg Intramuscular TID PRN Bobbitt, Shalon E, NP       And   diphenhydrAMINE  (BENADRYL ) injection 50 mg  50 mg Intramuscular TID PRN Bobbitt, Shalon E, NP       And   LORazepam  (ATIVAN ) injection 2 mg  2 mg Intramuscular TID PRN Bobbitt, Shalon E, NP       gabapentin (NEURONTIN) capsule 300 mg  300 mg Oral BID PRN  Nguyen, Julie, DO       hydrOXYzine  (ATARAX ) tablet 25 mg  25 mg Oral Q6H PRN Bobbitt, Shalon E, NP   25 mg at 04/19/24 2107   loperamide  (IMODIUM ) capsule 2-4 mg  2-4 mg Oral PRN Bobbitt, Shalon E, NP       LORazepam  (ATIVAN ) tablet 1 mg  1 mg Oral Q6H PRN Bobbitt, Shalon E, NP       magnesium  hydroxide (MILK OF MAGNESIA) suspension 30 mL  30 mL Oral Daily PRN Bobbitt, Shalon E, NP       multivitamin with minerals tablet 1 tablet  1 tablet Oral Daily Bobbitt, Shalon E, NP   1 tablet at 04/20/24 0941   naltrexone (DEPADE) tablet 50 mg  50 mg Oral QHS Nguyen, Julie, DO   50 mg at 04/19/24 2108   nicotine (NICODERM CQ - dosed in mg/24 hours) patch 21 mg  21 mg Transdermal Q0600 Nguyen, Julie, DO   21 mg at 04/19/24 0625    nicotine polacrilex (NICORETTE) gum 4 mg  4 mg Oral PRN Nguyen, Julie, DO   4 mg at 04/20/24 0836   ondansetron  (ZOFRAN -ODT) disintegrating tablet 4 mg  4 mg Oral Q6H PRN Bobbitt, Shalon E, NP       polyethylene glycol (MIRALAX  / GLYCOLAX ) packet 17 g  17 g Oral Daily PRN Nguyen, Julie, DO       senna (SENOKOT) tablet 8.6 mg  1 tablet Oral Daily PRN Nguyen, Julie, DO       thiamine  (VITAMIN B1) tablet 100 mg  100 mg Oral QHS Nguyen, Julie, DO   100 mg at 04/19/24 2108   traZODone  (DESYREL ) tablet 50 mg  50 mg Oral QHS PRN Bobbitt, Shalon E, NP       No current outpatient medications on file.    Labs  Lab Results:  Admission on 04/17/2024, Discharged on 04/17/2024  Component Date Value Ref Range Status   WBC 04/17/2024 8.0  4.0 - 10.5 K/uL Final   RBC 04/17/2024 5.61  4.22 - 5.81 MIL/uL Final   Hemoglobin 04/17/2024 16.4  13.0 - 17.0 g/dL Final   HCT 16/09/9603 47.6  39.0 - 52.0 % Final   MCV 04/17/2024 84.8  80.0 - 100.0 fL Final   MCH 04/17/2024 29.2  26.0 - 34.0 pg Final   MCHC 04/17/2024 34.5  30.0 - 36.0 g/dL Final   RDW 54/08/8118 13.1  11.5 - 15.5 % Final   Platelets 04/17/2024 241  150 - 400 K/uL Final   nRBC 04/17/2024 0.0  0.0 - 0.2 % Final   Neutrophils Relative % 04/17/2024 57  % Final   Neutro Abs 04/17/2024 4.6  1.7 - 7.7 K/uL Final   Lymphocytes Relative 04/17/2024 31  % Final   Lymphs Abs 04/17/2024 2.5  0.7 - 4.0 K/uL Final   Monocytes Relative 04/17/2024 8  % Final   Monocytes Absolute 04/17/2024 0.7  0.1 - 1.0 K/uL Final   Eosinophils Relative 04/17/2024 2  % Final   Eosinophils Absolute 04/17/2024 0.2  0.0 - 0.5 K/uL Final   Basophils Relative 04/17/2024 1  % Final   Basophils Absolute 04/17/2024 0.1  0.0 - 0.1 K/uL Final   Immature Granulocytes 04/17/2024 1  % Final   Abs Immature Granulocytes 04/17/2024 0.05  0.00 - 0.07 K/uL Final   Performed at [facility], [address] [place], [place] [address]   Sodium 04/17/2024 142  135 - 145 mmol/L Final    Potassium  04/17/2024 3.7  3.5 - 5.1 mmol/L Final   Chloride 04/17/2024 105  98 - 111 mmol/L Final   CO2 04/17/2024 25  22 - 32 mmol/L Final   Glucose, Bld 04/17/2024 82  70 - 99 mg/dL Final   Glucose reference range applies only to samples taken after fasting for at least 8 hours.   BUN 04/17/2024 22 (H)  6 - 20 mg/dL Final   Creatinine, Ser 04/17/2024 0.82  0.61 - 1.24 mg/dL Final   Calcium 29/52/8413 9.3  8.9 - 10.3 mg/dL Final   Total Protein 24/40/1027 6.3 (L)  6.5 - 8.1 g/dL Final   Albumin 25/36/6440 4.3  3.5 - 5.0 g/dL Final   AST 34/74/2595 16  15 - 41 U/L Final   ALT 04/17/2024 29  0 - 44 U/L Final   Alkaline Phosphatase 04/17/2024 91  38 - 126 U/L Final   Total Bilirubin 04/17/2024 1.2  0.0 - 1.2 mg/dL Final   GFR, Estimated 04/17/2024 >60  >60 mL/min Final   Comment: (NOTE) Calculated using the CKD-EPI Creatinine Equation (2021)    Anion gap 04/17/2024 12  5 - 15 Final   Performed at [facility], [address], [place], [place] [address]   POC Amphetamine UR 04/17/2024 None Detected  NONE DETECTED (Cut Off Level 1000 ng/mL) Final   POC Secobarbital (BAR) 04/17/2024 None Detected  NONE DETECTED (Cut Off Level 300 ng/mL) Final   POC Buprenorphine (BUP) 04/17/2024 None Detected  NONE DETECTED (Cut Off Level 10 ng/mL) Final   POC Oxazepam (BZO) 04/17/2024 None Detected  NONE DETECTED (Cut Off Level 300 ng/mL) Final   POC Cocaine UR 04/17/2024 None Detected  NONE DETECTED (Cut Off Level 300 ng/mL) Final   POC Methamphetamine UR 04/17/2024 None Detected  NONE DETECTED (Cut Off Level 1000 ng/mL) Final   POC Morphine  04/17/2024 None Detected  NONE DETECTED (Cut Off Level 300 ng/mL) Final   POC Methadone UR 04/17/2024 None Detected  NONE DETECTED (Cut Off Level 300 ng/mL) Final   POC Oxycodone UR 04/17/2024 None Detected  NONE DETECTED (Cut Off Level 100 ng/mL) Final   POC Marijuana UR 04/17/2024 None Detected  NONE DETECTED (Cut Off Level 50 ng/mL) Final   Alcohol, Ethyl (B)  04/17/2024 <15  <15 mg/dL Final   Comment: Please note change in reference range. (NOTE) For medical purposes only. Performed at [facility], [address], [place], [place] [address]   Admission on 01/03/2024, Discharged on 01/03/2024  Component Date Value Ref Range Status   SARS Coronavirus 2 by RT PCR 01/03/2024 NEGATIVE  NEGATIVE Final   Comment: (NOTE) SARS-CoV-2 target nucleic acids are NOT DETECTED.  The SARS-CoV-2 RNA is generally detectable in upper respiratory specimens during the acute phase of infection. The lowest concentration of SARS-CoV-2 viral copies this assay can detect is 138 copies/mL. A negative result does not preclude SARS-Cov-2 infection and should not be used as the sole basis for treatment or other patient management decisions. A negative result may occur with  improper specimen collection/handling, submission of specimen other than nasopharyngeal swab, presence of viral mutation(s) within the areas targeted by this assay, and inadequate number of viral copies(<138 copies/mL). A negative result must be combined with clinical observations, patient history, and epidemiological information. The expected result is Negative.  Fact Sheet for Patients:  BloggerCourse.com  Fact Sheet for Healthcare Providers:  SeriousBroker.it  This test is no  t yet approved or cleared by the [place]  FDA and  has been authorized for detection and/or diagnosis of SARS-CoV-2 by FDA under an Emergency Use Authorization (EUA). This EUA will remain  in effect (meaning this test can be used) for the duration of the COVID-19 declaration under Section 564(b)(1) of the Act, 21 U.S.C.section 360bbb-3(b)(1), unless the authorization is terminated  or revoked sooner.       Influenza A by PCR 01/03/2024 NEGATIVE  NEGATIVE Final   Influenza B by PCR 01/03/2024 NEGATIVE  NEGATIVE Final   Comment:  (NOTE) The Xpert Xpress SARS-CoV-2/FLU/RSV plus assay is intended as an aid in the diagnosis of influenza from Nasopharyngeal swab specimens and should not be used as a sole basis for treatment. Nasal washings and aspirates are unacceptable for Xpert Xpress SARS-CoV-2/FLU/RSV testing.  Fact Sheet for Patients: BloggerCourse.com  Fact Sheet for Healthcare Providers: SeriousBroker.it  This test is not yet approved or cleared by the [place]  FDA and has been authorized for detection and/or diagnosis of SARS-CoV-2 by FDA under an Emergency Use Authorization (EUA). This EUA will remain in effect (meaning this test can be used) for the duration of the COVID-19 declaration under Section 564(b)(1) of the Act, 21 U.S.C. section 360bbb-3(b)(1), unless the authorization is terminated or revoked.     Resp Syncytial Virus by PCR 01/03/2024 POSITIVE (A)  NEGATIVE Final   Comment: (NOTE) Fact Sheet for Patients: BloggerCourse.com  Fact Sheet for Healthcare Providers: SeriousBroker.it  This test is not yet approved or cleared by the [place]  FDA and has been authorized for detection and/or diagnosis of SARS-CoV-2 by FDA under an Emergency Use Authorization (EUA). This EUA will remain in effect (meaning this test can be used) for the duration of the COVID-19 declaration under Section 564(b)(1) of the Act, 21 U.S.C. section 360bbb-3(b)(1), unless the authorization is terminated or revoked.  Performed at [facility], [address] [place], [place] [address]     Blood Alcohol level:  Lab Results  Component Value Date   [facility] <15 04/17/2024   ETH <10 05/03/2019    Metabolic Disorder Labs: No results found for: "HGBA1C", "MPG" No results found for: "PROLACTIN" Lab Results  Component Value Date   CHOL 175 04/05/2016   TRIG 86 04/05/2016   HDL 37 (L) 04/05/2016   CHOLHDL 4.7  04/05/2016   VLDL 17 04/05/2016   LDLCALC 121 (H) 04/05/2016    Therapeutic Lab Levels: No results found for: "LITHIUM" No results found for: "VALPROATE" No results found for: "CBMZ"  Physical Findings   AIMS    Flowsheet Row [facility]  AIMS Total Score 0      AUDIT    Flowsheet Row [facility]  Alcohol Use Disorder Identification Test Final Score (AUDIT) 5      PHQ2-9    Flowsheet Row ED from 04/17/2024 in [facility]  PHQ-2 Total Score 2  PHQ-9 Total Score 4      Flowsheet Row ED from 04/17/2024 in [facility] Most recent reading at 04/18/2024 12:21 AM ED from 04/17/2024 in [facility] Most recent reading at 04/17/2024  4:23 PM ED from 01/03/2024 in [facility] at [facility] Most recent reading at 01/03/2024  2:00 PM  C-SSRS RISK CATEGORY No Risk No Risk No Risk        Musculoskeletal  Strength & Muscle Tone: within normal limited Gait & Station: normal Patient leans: NA   Psychiatric Specialty Exam  Presentation General Appearance:Appropriate for Environment, Fairly Groomed (pleasant, engaged) Eye Contact:Good Speech:Clear and Coherent, Normal Rate (spontaneous) Volume:Normal Handedness:Right  Mood and Affect  Mood: "fine" Affect:Appropriate, Congruent,  Thought Process  Thought Process:Coherent, Goal Directed, Linear Descriptions of Associations:Intact  Thought Content Suicidal Thoughts:No (last time passive SI was 04/16/2024) Homicidal Thoughts:No Hallucinations:None Ideas of Reference:None Thought Content:WDL, Logical  Sensorium  Memory:Immediate Good, Recent Good Judgment:Good Insight:Good  Executive Functions  Orientation:Full (Time, Place and  Person) Language:Good Concentration:Good Attention:Good Recall:Good Fund of Knowledge:Good  Psychomotor Activity  Psychomotor Activity:Psychomotor Activity: Normal  Assets  Assets:Communication Skills, Desire for Improvement, Leisure Time, Physical Health, Resilience  Sleep  Fair  Physical Exam  Physical Exam Vitals and nursing note reviewed.  Constitutional:      General: He is not in acute distress.    Appearance: Normal appearance. He is not ill-appearing, toxic-appearing or diaphoretic.  HENT:     Head: Normocephalic and atraumatic.     Nose: No congestion.  Eyes:     Pupils: Pupils are equal, round, and reactive to light.     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 No respiratory distress.  Musculoskeletal:        General: No deformity or signs of injury. Normal range of motion.     Right lower leg: No edema.     Left lower leg: No edema.     Comments: Mild swelling and tenderness at the site of B1 injection. Hurt more after putting on nic patch on that side. Better when removed it. No redness, bleeding, bruising, wounds.  Neurological:     General: No focal deficit present.     Mental Status: He is alert and oriented to person, place, and time.    Blood pressure (!) 119/96, pulse 70, temperature 98.2 F (36.8 C), temperature source Oral, resp. rate 19, SpO2 100%. There is no height or weight on file to calculate BMI.  Treatment Plan Summary: Daily contact with patient to assess and evaluate symptoms and progress in treatment and Medication management Principal Problem:   Alcohol use disorder, severe, dependence (HCC) Active Problems:   History of suicide attempt   History of admission to inpatient psychiatry department   Substance induced mood disorder (HCC)   Tobacco use disorder   Cocaine use disorder in remission  [name] is a 43 y.o. male with AUD, nicotine use d/o, SIMD, MDD in  remission, remote suicide attempt and inpatient psych admissions, who admitted to [facility] (04/17/2024) from [facility] the same day for etoh detox for daily drinking since 08/2023 after job loss and homelessness Last drink was 04/16/2024 First time detox   Still experiencing some withdrawal sxs, tremors and diaphoresis last night, but none this AM. Tolerated new rx well, increasing per below.  This morning started having cramping abdominal pain when he woke up, described as feeling he needs to have a BM, but he doesn't have the urge to currently. He's only had 1 BM since admission and he's usually regular daily. I suspect constipation, so started him on bowel regimen per below to help his bowels.  Also his arm was hurting him on the side of the B1 shot, also same arm as nic patch. Instructed to switch arms which seems to help, nothing else to do unless he mentions it.  # AUD w  dependence (no sz or DT) # SIMD INCREASED naltrexone 25 mg to 50 mg qPM (i5/03/2024)  librium taper ended 04/19/2024 Continued gabapentin 300 mg BID PRN  Continued thiamine  supplement Continued CIWA + PRNs per protocol   # Tobacco use d/o NRT  Consider wellbutrin SR vs chantix - patient amenable  # Constipation Instructed to walk, drink fluids, eat fibers  miralax  BID x1 day then change to PRN  senna x1 dose then change to PRN  DISPO:  Tentative date: 5/4 Location: [facility], called already,  Follow-up/Recs:   PRNs: acetaminophen , 650 mg, Q6H PRN alum & mag hydroxide-simeth, 30 mL, Q4H PRN haloperidol, 5 mg, TID PRN  And diphenhydrAMINE , 50 mg, TID PRN haloperidol lactate, 5 mg, TID PRN  And diphenhydrAMINE , 50 mg, TID PRN  And LORazepam , 2 mg, TID PRN haloperidol lactate, 10 mg, TID PRN  And diphenhydrAMINE , 50 mg, TID PRN  And LORazepam , 2 mg, TID PRN gabapentin, 300 mg, BID PRN hydrOXYzine , 25 mg, Q6H PRN loperamide , 2-4 mg, PRN LORazepam , 1 mg, Q6H PRN magnesium  hydroxide, 30 mL, Daily PRN nicotine  polacrilex, 4 mg, PRN ondansetron , 4 mg, Q6H PRN polyethylene glycol, 17 g, Daily PRN senna, 1 tablet, Daily PRN traZODone , 50 mg, QHS PRN    Clinical Course as of 04/20/24 1444  Sat Apr 18, 2024  0903 EKG 04/17/2024 Vent. rate 69 BPM QT/QTcB 382/409 ms Normal sinus rhythm with sinus arrhythmia Normal ECG When compared with ECG of 14-May-2023 13:13, PREVIOUS ECG IS PRESENT [JN]    Clinical Course User Index [JN] Nguyen, Julie, DO    Silas Drivers, MD

## 2024-04-20 NOTE — Group Note (Signed)
 Group Topic: Relapse and Recovery  Group Date: 04/20/2024 Start Time: 1000 End Time: 1100 Facilitators: Milan Alfred, NT 3 MHT 2 Department: [facility]  Number of Participants: 7  Group Focus: relapse prevention Treatment Modality:  Solution-Focused Therapy Interventions utilized were problem solving Purpose: relapse prevention strategies  Name: [name] Date of Birth: August 19, 1981  MR: [id number]    Level of Participation: active Quality of Participation: engaged Interactions with others: gave feedback Mood/Affect: appropriate Triggers (if applicable): Discussed avoiding triggers Cognition: goal directed Progress: Moderate Response: Appropriate  Plan: patient will be encouraged to work on avoiding triggers.  Patients Problems:  Patient Active Problem List   Diagnosis Date Noted   Alcohol use disorder, severe, dependence (HCC) 04/18/2024   History of suicide attempt 04/18/2024   History of admission to inpatient psychiatry department 04/18/2024   Substance induced mood disorder (HCC) 04/18/2024   Tobacco use disorder 04/18/2024   Cocaine use disorder in remission 04/18/2024   Alcohol use disorder 04/17/2024   Noninfectious gastroenteritis 10/21/2023   Adjustment disorder with mixed anxiety and depressed mood 04/06/2016

## 2024-04-20 NOTE — ED Notes (Signed)
 Pt calm and sleeping in the bedroom, NAD. Respirations even and unlabored. Will monitor for safety.

## 2024-04-20 NOTE — Group Note (Signed)
 Group Topic: Recovery Basics  Group Date: 04/20/2024 Start Time: 1130 End Time: 1200 Facilitators: Arlan Belling, RN  Department: [facility]  Number of Participants: 9  Group Focus: chemical dependency education and chemical dependency issues Treatment Modality:  Cognitive Behavioral Therapy Interventions utilized were clarification, exploration, group exercise, and patient education Purpose: enhance coping skills, explore maladaptive thinking, express feelings, express irrational fears, improve communication skills, increase insight, regain self-worth, reinforce self-care, and relapse prevention strategies  Name: [name] Date of Birth: 12-13-1981  MR: [id number]    Level of Participation: minimal Quality of Participation: attentive and cooperative Interactions with others: gave feedback Mood/Affect: appropriate Triggers (if applicable):   Cognition: coherent/clear and goal directed Progress: Minimal Response:   Plan: follow-up needed  Patients Problems:  Patient Active Problem List   Diagnosis Date Noted   Alcohol use disorder, severe, dependence (HCC) 04/18/2024   History of suicide attempt 04/18/2024   History of admission to inpatient psychiatry department 04/18/2024   Substance induced mood disorder (HCC) 04/18/2024   Tobacco use disorder 04/18/2024   Cocaine use disorder in remission 04/18/2024   Alcohol use disorder 04/17/2024   Noninfectious gastroenteritis 10/21/2023   Adjustment disorder with mixed anxiety and depressed mood 04/06/2016

## 2024-04-20 NOTE — Discharge Planning (Signed)
 LCSW met with patient for assessment and discussion of disposition plans. Patient presented to [facility] for ETOH detox. This was patients first time seeking help for detox. He reported drinking in excess of 24 beers and liquor daily since he became homeless in September. Patient was working until 2 weeks ago when he lost his job due to his ETOH abuse. He has stayed in a shelter until January. He received information about [facility] from a friend and called to self refer and spoke with [facility] @ [phone] ext [phone]. This SW made call to [facility] and left voice mail with Rayetta Cain to return call in regards to referral and any clinical documentation needed for referral and to inquire about his approval and possible admission logistics. Will continue to follow and assist as needed.

## 2024-04-20 NOTE — Group Note (Signed)
 Group Topic: Emotional Regulation  Group Date: 04/20/2024 Start Time: 2030 End Time: 2100 Facilitators: Alvino Joseph, NT  Department: [facility]  Number of Participants: 9  Group Focus: coping skills Treatment Modality:  Individual Therapy Interventions utilized were assignment Purpose: enhance coping skills  Name: [name] Date of Birth: [birth year]  MR: [id number]    Level of Participation: active Quality of Participation: cooperative Interactions with others: gave feedback Mood/Affect: appropriate Triggers (if applicable): friends, clubs Cognition: coherent/clear Progress: Moderate Response: coping- work out, Systems developer ,going out to eat Plan: follow-up needed  Patients Problems:  Patient Active Problem List   Diagnosis Date Noted   Alcohol use disorder, severe, dependence (HCC) 04/18/2024   History of suicide attempt 04/18/2024   History of admission to inpatient psychiatry department 04/18/2024   Substance induced mood disorder (HCC) 04/18/2024   Tobacco use disorder 04/18/2024   Cocaine use disorder in remission 04/18/2024   Alcohol use disorder 04/17/2024   Noninfectious gastroenteritis 10/21/2023   Adjustment disorder with mixed anxiety and depressed mood 04/06/2016

## 2024-04-20 NOTE — ED Notes (Signed)
 Pt is currently sleeping, no distress noted, environmental check complete, will continue to monitor patient for safety.

## 2024-04-20 NOTE — ED Notes (Signed)
 Patient is awake and alert on unit.  He is sitting in dayroom talking with peer.  No distress.  Will monitor.

## 2024-04-20 NOTE — ED Notes (Signed)
 Patient is currently sitting in the dining room watching television with other patients, patients respirations are even and unlabored, patient does not appear to be in any acute distress, will continue to monitor patient for safety.

## 2024-04-21 DIAGNOSIS — F1491 Cocaine use, unspecified, in remission: Secondary | ICD-10-CM | POA: Diagnosis not present

## 2024-04-21 DIAGNOSIS — F17210 Nicotine dependence, cigarettes, uncomplicated: Secondary | ICD-10-CM | POA: Diagnosis not present

## 2024-04-21 DIAGNOSIS — F1020 Alcohol dependence, uncomplicated: Secondary | ICD-10-CM | POA: Diagnosis not present

## 2024-04-21 DIAGNOSIS — F325 Major depressive disorder, single episode, in full remission: Secondary | ICD-10-CM | POA: Diagnosis not present

## 2024-04-21 NOTE — Group Note (Signed)
 Group Topic: Fears and Unhealthy Coping Skills  Group Date: 04/21/2024 Start Time: 0945 End Time: 1100 Facilitators: Dennis Fitting, NT  Department: [facility]  Number of Participants: 9  Group Focus: coping skills, personal responsibility, self-awareness, and social skills Treatment Modality:  Psychoeducation Interventions utilized were clarification, exploration, problem solving, and support Purpose: enhance coping skills, explore maladaptive thinking, regain self-worth, and reinforce self-care  Name: [name] Date of Birth: March 18, 1981  MR: [id number]    Level of Participation: active Quality of Participation: cooperative, engaged, and offered feedback Interactions with others: gave feedback Mood/Affect: appropriate Triggers (if applicable): N/A Cognition: coherent/clear Progress: Gaining insight Response: PT discussed being anxious about losing his unemployment benefits. Discussed solutions to his problem. PT explained that his positive coping skills include walking, working out, reading and watching movies. Patient wants to attend AA upon discharges. Plan: patient will be encouraged to attend groups and set goals during stay.  Patients Problems:  Patient Active Problem List   Diagnosis Date Noted   Alcohol use disorder, severe, dependence (HCC) 04/18/2024   History of suicide attempt 04/18/2024   History of admission to inpatient psychiatry department 04/18/2024   Substance induced mood disorder (HCC) 04/18/2024   Tobacco use disorder 04/18/2024   Cocaine use disorder in remission 04/18/2024   Alcohol use disorder 04/17/2024   Noninfectious gastroenteritis 10/21/2023   Adjustment disorder with mixed anxiety and depressed mood 04/06/2016

## 2024-04-21 NOTE — ED Notes (Signed)
 Patient sitting in dayroom interacting with peers. No acute distress noted. No concerns voiced. Informed patient to notify staff with any needs or assistance. Patient verbalized understanding or agreement. Safety checks in place per facility policy.

## 2024-04-21 NOTE — Group Note (Signed)
 Group Topic: Understanding Self  Group Date: 04/21/2024 Start Time: 1600 End Time: 1635 Facilitators: Ardie Kras L, RN  Department: [facility]  Number of Participants: 7  Group Focus: check in, clarity of thought, coping skills, goals/reality orientation, nursing group, and self-awareness Treatment Modality:  Psychoeducation Interventions utilized were clarification, exploration, group exercise, problem solving, and story telling Purpose: enhance coping skills, express feelings, express irrational fears, increase insight, regain self-worth, and reinforce self-care  Name: [name] Date of Birth: [birth year]  MR: [id number]    Level of Participation: active Quality of Participation: cooperative and engaged Interactions with others: gave feedback Mood/Affect: positive Triggers (if applicable): n/a Cognition: blaming, insightful, and logical Progress: Gaining insight Response: Patient blamed others for his situation at start of communication. With the groups help, he was able to gain insight into his responsibility for his situation. He states he has to do some introspection and stay in touch with his spiritual self through prayer.  Plan: follow-up needed  Patients Problems:  Patient Active Problem List   Diagnosis Date Noted   Alcohol use disorder, severe, dependence (HCC) 04/18/2024   History of suicide attempt 04/18/2024   History of admission to inpatient psychiatry department 04/18/2024   Substance induced mood disorder (HCC) 04/18/2024   Tobacco use disorder 04/18/2024   Cocaine use disorder in remission 04/18/2024   Alcohol use disorder 04/17/2024   Noninfectious gastroenteritis 10/21/2023   Adjustment disorder with mixed anxiety and depressed mood 04/06/2016

## 2024-04-21 NOTE — ED Provider Notes (Signed)
 Behavioral Health Progress Note  Date and Time: 04/21/2024 1:45 PM Name: [name] MRN:  [id number] CC: etoh detox  Subjective:  [name] is a 43 y.o. male, with AUD, nicotine use d/o, SIMD, MDD in remission, remote suicide attempt and inpatient psych admissions, who admitted to [facility] (04/17/2024) from [facility] the same day for etoh detox for daily drinking since 08/2023 after job loss and homelessness Last drink was 04/16/2024 First time detox  Chart reviewed, case discussed with RN and Child psychotherapist, patient seen today during rounds.  Social worker reported that she has left a message for [facility].  She is waiting on their callback.  Patient reports fine mood today.  He reports his sleep and appetite has improved.  He is hopeful to get into this long-term facility.  He reports that he might be able to stay there for 2 years.   Patient denies side effect from medicine.  He denies any withdrawal symptoms today.    He was encouraged to continue attending groups and work on sobriety and coping strategies.  Today patient denies thoughts of harming himself or others.  He denies psychotic or manic symptoms.  Review of Systems  Constitutional:  Negative for malaise/fatigue.  Respiratory:  Negative for shortness of breath.   Cardiovascular:  Negative for chest pain.  Gastrointestinal:  Negative for abdominal pain, nausea and vomiting.  Musculoskeletal:        Arm soreness  Neurological:  Negative for dizziness, tremors, speech change, focal weakness, weakness and headaches.     Diagnosis:  Final diagnoses:  Alcohol use disorder, severe, dependence (HCC)  History of suicide attempt  History of admission to inpatient psychiatry department  Tobacco use disorder  Cocaine use disorder in remission    Past Psychiatric History: See H&P Past Medical History: See H&P Family History: See H&P Family Psychiatric  History: See H&P Social History: See H&P            Current  Medications:  Current Facility-Administered Medications  Medication Dose Route Frequency Provider Last Rate Last Admin   acetaminophen  (TYLENOL ) tablet 650 mg  650 mg Oral Q6H PRN Bobbitt, Shalon E, NP       alum & mag hydroxide-simeth (MAALOX/MYLANTA) 200-200-20 MG/5ML suspension 30 mL  30 mL Oral Q4H PRN Bobbitt, Shalon E, NP       haloperidol (HALDOL) tablet 5 mg  5 mg Oral TID PRN Bobbitt, Shalon E, NP       And   diphenhydrAMINE  (BENADRYL ) capsule 50 mg  50 mg Oral TID PRN Bobbitt, Shalon E, NP       haloperidol lactate (HALDOL) injection 5 mg  5 mg Intramuscular TID PRN Bobbitt, Shalon E, NP       And   diphenhydrAMINE  (BENADRYL ) injection 50 mg  50 mg Intramuscular TID PRN Bobbitt, Shalon E, NP       And   LORazepam  (ATIVAN ) injection 2 mg  2 mg Intramuscular TID PRN Bobbitt, Shalon E, NP       haloperidol lactate (HALDOL) injection 10 mg  10 mg Intramuscular TID PRN Bobbitt, Shalon E, NP       And   diphenhydrAMINE  (BENADRYL ) injection 50 mg  50 mg Intramuscular TID PRN Bobbitt, Shalon E, NP       And   LORazepam  (ATIVAN ) injection 2 mg  2 mg Intramuscular TID PRN Bobbitt, Shalon E, NP       gabapentin (NEURONTIN) capsule 300 mg  300 mg  Oral BID PRN Nguyen, Julie, DO       magnesium  hydroxide (MILK OF MAGNESIA) suspension 30 mL  30 mL Oral Daily PRN Bobbitt, Shalon E, NP       multivitamin with minerals tablet 1 tablet  1 tablet Oral Daily Bobbitt, Shalon E, NP   1 tablet at 04/21/24 0915   naltrexone (DEPADE) tablet 50 mg  50 mg Oral QHS Nguyen, Julie, DO   50 mg at 04/19/24 2108   nicotine (NICODERM CQ - dosed in mg/24 hours) patch 21 mg  21 mg Transdermal Q0600 Nguyen, Julie, DO   21 mg at 04/19/24 0625   nicotine polacrilex (NICORETTE) gum 4 mg  4 mg Oral PRN Nguyen, Julie, DO   4 mg at 04/21/24 0917   polyethylene glycol (MIRALAX  / GLYCOLAX ) packet 17 g  17 g Oral Daily PRN Nguyen, Julie, DO       senna (SENOKOT) tablet 8.6 mg  1 tablet Oral Daily PRN Nguyen, Julie, DO        thiamine  (VITAMIN B1) tablet 100 mg  100 mg Oral QHS Nguyen, Julie, DO   100 mg at 04/20/24 2109   traZODone  (DESYREL ) tablet 50 mg  50 mg Oral QHS PRN Bobbitt, Shalon E, NP       No current outpatient medications on file.    Labs  Lab Results:  Admission on 04/17/2024, Discharged on 04/17/2024  Component Date Value Ref Range Status   WBC 04/17/2024 8.0  4.0 - 10.5 K/uL Final   RBC 04/17/2024 5.61  4.22 - 5.81 MIL/uL Final   Hemoglobin 04/17/2024 16.4  13.0 - 17.0 g/dL Final   HCT 30/86/5784 47.6  39.0 - 52.0 % Final   MCV 04/17/2024 84.8  80.0 - 100.0 fL Final   MCH 04/17/2024 29.2  26.0 - 34.0 pg Final   MCHC 04/17/2024 34.5  30.0 - 36.0 g/dL Final   RDW 69/62/9528 13.1  11.5 - 15.5 % Final   Platelets 04/17/2024 241  150 - 400 K/uL Final   nRBC 04/17/2024 0.0  0.0 - 0.2 % Final   Neutrophils Relative % 04/17/2024 57  % Final   Neutro Abs 04/17/2024 4.6  1.7 - 7.7 K/uL Final   Lymphocytes Relative 04/17/2024 31  % Final   Lymphs Abs 04/17/2024 2.5  0.7 - 4.0 K/uL Final   Monocytes Relative 04/17/2024 8  % Final   Monocytes Absolute 04/17/2024 0.7  0.1 - 1.0 K/uL Final   Eosinophils Relative 04/17/2024 2  % Final   Eosinophils Absolute 04/17/2024 0.2  0.0 - 0.5 K/uL Final   Basophils Relative 04/17/2024 1  % Final   Basophils Absolute 04/17/2024 0.1  0.0 - 0.1 K/uL Final   Immature Granulocytes 04/17/2024 1  % Final   Abs Immature Granulocytes 04/17/2024 0.05  0.00 - 0.07 K/uL Final   Performed at [facility], [address], [place], [place] [address]   Sodium 04/17/2024 142  135 - 145 mmol/L Final   Potassium 04/17/2024 3.7  3.5 - 5.1 mmol/L Final   Chloride 04/17/2024 105  98 - 111 mmol/L Final   CO2 04/17/2024 25  22 - 32 mmol/L Final   Glucose, Bld 04/17/2024 82  70 - 99 mg/dL Final   Glucose reference range applies only to samples taken after fasting for at least 8 hours.   BUN 04/17/2024 22 (H)  6 - 20 mg/dL Final   Creatinine, Ser 04/17/2024 0.82  0.61 - 1.24  mg/dL Final  Calcium 04/17/2024 9.3  8.9 - 10.3 mg/dL Final   Total Protein 09/81/1914 6.3 (L)  6.5 - 8.1 g/dL Final   Albumin 78/29/5621 4.3  3.5 - 5.0 g/dL Final   AST 30/86/5784 16  15 - 41 U/L Final   ALT 04/17/2024 29  0 - 44 U/L Final   Alkaline Phosphatase 04/17/2024 91  38 - 126 U/L Final   Total Bilirubin 04/17/2024 1.2  0.0 - 1.2 mg/dL Final   GFR, Estimated 04/17/2024 >60  >60 mL/min Final   Comment: (NOTE) Calculated using the CKD-EPI Creatinine Equation (2021)    Anion gap 04/17/2024 12  5 - 15 Final   Performed at [facility], [address] [place], [place] [address]   POC Amphetamine UR 04/17/2024 None Detected  NONE DETECTED (Cut Off Level 1000 ng/mL) Final   POC Secobarbital (BAR) 04/17/2024 None Detected  NONE DETECTED (Cut Off Level 300 ng/mL) Final   POC Buprenorphine (BUP) 04/17/2024 None Detected  NONE DETECTED (Cut Off Level 10 ng/mL) Final   POC Oxazepam (BZO) 04/17/2024 None Detected  NONE DETECTED (Cut Off Level 300 ng/mL) Final   POC Cocaine UR 04/17/2024 None Detected  NONE DETECTED (Cut Off Level 300 ng/mL) Final   POC Methamphetamine UR 04/17/2024 None Detected  NONE DETECTED (Cut Off Level 1000 ng/mL) Final   POC Morphine  04/17/2024 None Detected  NONE DETECTED (Cut Off Level 300 ng/mL) Final   POC Methadone UR 04/17/2024 None Detected  NONE DETECTED (Cut Off Level 300 ng/mL) Final   POC Oxycodone UR 04/17/2024 None Detected  NONE DETECTED (Cut Off Level 100 ng/mL) Final   POC Marijuana UR 04/17/2024 None Detected  NONE DETECTED (Cut Off Level 50 ng/mL) Final   Alcohol, Ethyl (B) 04/17/2024 <15  <15 mg/dL Final   Comment: Please note change in reference range. (NOTE) For medical purposes only. Performed at [facility], [address] [place], [place] [address]   Admission on 01/03/2024, Discharged on 01/03/2024  Component Date Value Ref Range Status   SARS Coronavirus 2 by RT PCR 01/03/2024 NEGATIVE  NEGATIVE Final   Comment:  (NOTE) SARS-CoV-2 target nucleic acids are NOT DETECTED.  The SARS-CoV-2 RNA is generally detectable in upper respiratory specimens during the acute phase of infection. The lowest concentration of SARS-CoV-2 viral copies this assay can detect is 138 copies/mL. A negative result does not preclude SARS-Cov-2 infection and should not be used as the sole basis for treatment or other patient management decisions. A negative result may occur with  improper specimen collection/handling, submission of specimen other than nasopharyngeal swab, presence of viral mutation(s) within the areas targeted by this assay, and inadequate number of viral copies(<138 copies/mL). A negative result must be combined with clinical observations, patient history, and epidemiological information. The expected result is Negative.  Fact Sheet for Patients:  BloggerCourse.com  Fact Sheet for Healthcare Providers:  SeriousBroker.it  This test is no                          t yet approved or cleared by the [place]  FDA and  has been authorized for detection and/or diagnosis of SARS-CoV-2 by FDA under an Emergency Use Authorization (EUA). This EUA will remain  in effect (meaning this test can be used) for the duration of the COVID-19 declaration under Section 564(b)(1) of the Act, 21 U.S.C.section 360bbb-3(b)(1), unless the authorization is terminated  or revoked sooner.  Influenza A by PCR 01/03/2024 NEGATIVE  NEGATIVE Final   Influenza B by PCR 01/03/2024 NEGATIVE  NEGATIVE Final   Comment: (NOTE) The Xpert Xpress SARS-CoV-2/FLU/RSV plus assay is intended as an aid in the diagnosis of influenza from Nasopharyngeal swab specimens and should not be used as a sole basis for treatment. Nasal washings and aspirates are unacceptable for Xpert Xpress SARS-CoV-2/FLU/RSV testing.  Fact Sheet for Patients: BloggerCourse.com  Fact  Sheet for Healthcare Providers: SeriousBroker.it  This test is not yet approved or cleared by the [place]  FDA and has been authorized for detection and/or diagnosis of SARS-CoV-2 by FDA under an Emergency Use Authorization (EUA). This EUA will remain in effect (meaning this test can be used) for the duration of the COVID-19 declaration under Section 564(b)(1) of the Act, 21 U.S.C. section 360bbb-3(b)(1), unless the authorization is terminated or revoked.     Resp Syncytial Virus by PCR 01/03/2024 POSITIVE (A)  NEGATIVE Final   Comment: (NOTE) Fact Sheet for Patients: BloggerCourse.com  Fact Sheet for Healthcare Providers: SeriousBroker.it  This test is not yet approved or cleared by the [place]  FDA and has been authorized for detection and/or diagnosis of SARS-CoV-2 by FDA under an Emergency Use Authorization (EUA). This EUA will remain in effect (meaning this test can be used) for the duration of the COVID-19 declaration under Section 564(b)(1) of the Act, 21 U.S.C. section 360bbb-3(b)(1), unless the authorization is terminated or revoked.  Performed at [facility], [address] [place], [place] [address]     Blood Alcohol level:  Lab Results  Component Value Date   [facility] <15 04/17/2024   ETH <10 05/03/2019    Metabolic Disorder Labs: No results found for: "HGBA1C", "MPG" No results found for: "PROLACTIN" Lab Results  Component Value Date   CHOL 175 04/05/2016   TRIG 86 04/05/2016   HDL 37 (L) 04/05/2016   CHOLHDL 4.7 04/05/2016   VLDL 17 04/05/2016   LDLCALC 121 (H) 04/05/2016    Therapeutic Lab Levels: No results found for: "LITHIUM" No results found for: "VALPROATE" No results found for: "CBMZ"  Physical Findings   AIMS    Flowsheet Row [facility]  AIMS Total Score 0      AUDIT     Flowsheet Row Ad[facility]  Alcohol Use Disorder Identification Test Final Score (AUDIT) 5      PHQ2-9    Flowsheet Row ED from 04/17/2024 in [facility]  PHQ-2 Total Score 2  PHQ-9 Total Score 4      Flowsheet Row ED from 04/17/2024 in [facility] Most recent reading at 04/18/2024 12:21 AM ED from 04/17/2024 in [facility] Most recent reading at 04/17/2024  4:23 PM ED from 01/03/2024 in [facility] at [facility] Most recent reading at 01/03/2024  2:00 PM  C-SSRS RISK CATEGORY No Risk No Risk No Risk        Musculoskeletal  Strength & Muscle Tone: within normal limited Gait & Station: normal Patient leans: NA   Psychiatric Specialty Exam  Presentation General Appearance:Appropriate for Environment, Fairly Groomed (pleasant, engaged) Eye Contact:Good Speech:Clear and Coherent, Normal Rate (spontaneous) Volume:Normal Handedness:Right  Mood and Affect  Mood: "fine" Affect:Appropriate, Congruent,  Thought Process  Thought Process:Coherent, Goal Directed, Linear Descriptions of Associations:Intact  Thought Content Suicidal Thoughts: Denies Homicidal Thoughts: Denied Hallucinations:None Ideas of  Reference:None Thought Content:WDL, Logical  Sensorium  Memory:Immediate Good, Recent Good Judgment:Good Insight:Good  Executive Functions  Orientation:Full (Time, Place and Person) Language:Good Concentration:Good Attention:Good Recall:Good Fund of Knowledge:Good  Psychomotor Activity  Psychomotor Activity:Psychomotor Activity: Normal  Assets  Assets:Communication Skills, Desire for Improvement, Leisure Time, Physical Health, Resilience  Sleep  Fair  Physical Exam  Physical Exam Vitals and nursing note reviewed.  Constitutional:      General: He is not in acute distress.     Appearance: Normal appearance. He is not ill-appearing, toxic-appearing or diaphoretic.  HENT:     Head: Normocephalic and atraumatic.     Nose: No congestion.  Eyes:     Pupils: Pupils are equal, round, and reactive to light.     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 No respiratory distress.  Musculoskeletal:        General: No deformity or signs of injury. Normal range of motion.     Right lower leg: No edema.     Left lower leg: No edema.  Neurological:     General: No focal deficit present.     Mental Status: He is alert and oriented to person, place, and time.    Blood pressure 134/83, pulse 72, temperature 97.8 F (36.6 C), temperature source Oral, resp. rate 16, SpO2 98%. There is no height or weight on file to calculate BMI.  Treatment Plan Summary: Daily contact with patient to assess and evaluate symptoms and progress in treatment and Medication management Principal Problem:   Alcohol use disorder, severe, dependence (HCC) Active Problems:   History of suicide attempt   History of admission to inpatient psychiatry department   Substance induced mood disorder (HCC)   Tobacco use disorder   Cocaine use disorder in remission  [name] is a 43 y.o. male with AUD, nicotine use d/o, SIMD, MDD in remission, remote suicide attempt and inpatient psych admissions, who admitted to [facility] (04/17/2024) from [facility] the same day for etoh detox for daily drinking since 08/2023 after job loss and homelessness Last drink was 04/16/2024 First time detox   Still experiencing some withdrawal sxs, tremors and diaphoresis last night, but none this AM. Tolerated new rx well, increasing per below.  This morning started having cramping abdominal pain when he woke up, described as feeling he needs to have a BM, but he doesn't have the urge to currently. He's only had 1 BM since admission and he's usually regular daily. I suspect  constipation, so started him on bowel regimen per below to help his bowels.  Also his arm was hurting him on the side of the B1 shot, also same arm as nic patch. Instructed to switch arms which seems to help, nothing else to do unless he mentions it.  # AUD w dependence (no sz or DT) # SIMD INCREASED naltrexone 25 mg to 50 mg qPM (i5/03/2024)  librium taper ended 04/19/2024 Continued gabapentin 300 mg BID PRN  Continued thiamine  supplement Continued CIWA + PRNs per protocol   # Tobacco use d/o NRT  Consider wellbutrin SR vs chantix - patient amenable  # Constipation Instructed to walk, drink fluids, eat fibers  miralax  BID x1 day then change to PRN  senna x1 dose then change to PRN  DISPO:  Tentative date: 5/4 [facility], called already,  Follow-up/Recs:   PRNs: acetaminophen , 650 mg, Q6H PRN alum & mag hydroxide-simeth, 30 mL, Q4H PRN haloperidol, 5  mg, TID PRN  And diphenhydrAMINE , 50 mg, TID PRN haloperidol lactate, 5 mg, TID PRN  And diphenhydrAMINE , 50 mg, TID PRN  And LORazepam , 2 mg, TID PRN haloperidol lactate, 10 mg, TID PRN  And diphenhydrAMINE , 50 mg, TID PRN  And LORazepam , 2 mg, TID PRN gabapentin, 300 mg, BID PRN magnesium  hydroxide, 30 mL, Daily PRN nicotine polacrilex, 4 mg, PRN polyethylene glycol, 17 g, Daily PRN senna, 1 tablet, Daily PRN traZODone , 50 mg, QHS PRN    Clinical Course as of 04/21/24 1345  Sat Apr 18, 2024  0903 EKG 04/17/2024 Vent. rate 69 BPM QT/QTcB 382/409 ms Normal sinus rhythm with sinus arrhythmia Normal ECG When compared with ECG of 14-May-2023 13:13, PREVIOUS ECG IS PRESENT [JN]    Clinical Course User Index [JN] Nguyen, Julie, DO    Silas Drivers, MD

## 2024-04-21 NOTE — ED Notes (Signed)
 Patient alert & oriented x4. Denies intent to harm self or others when asked. Denies A/VH. Patient denies any physical complaints when asked. No acute distress noted. Scheduled medications administered with no complications. Support and encouragement provided. Routine safety checks conducted per facility protocol. Encouraged patient to notify staff if any thoughts of harm towards self or others arise. Patient verbalizes understanding and agreement.

## 2024-04-21 NOTE — ED Notes (Signed)
 Patient is sleeping. Respirations equal and unlabored, skin warm and dry. No change in assessment or acuity. Routine safety checks conducted according to facility protocol. Will continue to monitor for safety.

## 2024-04-21 NOTE — Discharge Planning (Signed)
 SW received call back from [employer] and [facility] stated that they did not accept patients type of Medicaid, ([employer]). SW spoke with patient and made him aware of other options of residential treatment programs that do accept [place] if he wanted me to look into and he agreed but hesitant and stated he wanted to go to this particular one because his friend was there.   SW made referral to [facility] and spoke with admission coordinator and they do accept patients Medicaid and asked for him to call at 3:30 for pre-screen phone assessment and he would likely be able to admit tomorrow as made aware that he was ready for DC and getting discouraged.   SW provided him with the number to call for pre screen phone interview and he stated that he just wanted to DC. Pt is homeless and SW reiterated that he has a very good chance to be admitted to this program at [facility] tomorrow and if he could make the decision from here. Will continue to follow.

## 2024-04-21 NOTE — ED Notes (Signed)
 Patient observed/assessed at nursing station. He denies A/V/H. He denies having any thoughts/plan of self harm and harm towards others. Fluid and snack offered. Patient states that appetite has been good throughout the day. Verbalizes no further complaints at this time. Will continue to monitor and support.

## 2024-04-21 NOTE — Group Note (Signed)
 Group Topic: Social Support  Group Date: 04/21/2024 Start Time: 2030 End Time: 2100 Facilitators: Alvino Joseph, NT  Department: [facility]  Number of Participants: 8  Group Focus: individual meeting Treatment Modality:  Solution-Focused Therapy Interventions utilized were support Purpose: relapse prevention strategies  Name: [name] Date of Birth: [birth year]  MR: [id number]    Level of Participation: active Quality of Participation: cooperative Interactions with others: gave feedback Mood/Affect: appropriate Triggers (if applicable): support- couisin, friend Cognition: coherent/clear Progress: Moderate Response: - make sure I dont  drink,work out Plan: follow-up needed  Patients Problems:  Patient Active Problem List   Diagnosis Date Noted   Alcohol use disorder, severe, dependence (HCC) 04/18/2024   History of suicide attempt 04/18/2024   History of admission to inpatient psychiatry department 04/18/2024   Substance induced mood disorder (HCC) 04/18/2024   Tobacco use disorder 04/18/2024   Cocaine use disorder in remission 04/18/2024   Alcohol use disorder 04/17/2024   Noninfectious gastroenteritis 10/21/2023   Adjustment disorder with mixed anxiety and depressed mood 04/06/2016

## 2024-04-21 NOTE — ED Notes (Signed)
 Pt is currently sleeping, no distress noted, environmental check complete, will continue to monitor patient for safety.

## 2024-04-22 DIAGNOSIS — F1020 Alcohol dependence, uncomplicated: Secondary | ICD-10-CM | POA: Diagnosis not present

## 2024-04-22 DIAGNOSIS — F325 Major depressive disorder, single episode, in full remission: Secondary | ICD-10-CM | POA: Diagnosis not present

## 2024-04-22 DIAGNOSIS — F17210 Nicotine dependence, cigarettes, uncomplicated: Secondary | ICD-10-CM | POA: Diagnosis not present

## 2024-04-22 DIAGNOSIS — F1491 Cocaine use, unspecified, in remission: Secondary | ICD-10-CM | POA: Diagnosis not present

## 2024-04-22 MED ORDER — GABAPENTIN 300 MG PO CAPS: 300.0000 mg | ORAL_CAPSULE | Freq: Two times a day (BID) | ORAL | 0 refills | Status: AC

## 2024-04-22 MED ORDER — TRAZODONE HCL 50 MG PO TABS: 50.0000 mg | ORAL_TABLET | Freq: Every evening | ORAL | 0 refills | Status: AC | PRN

## 2024-04-22 MED ORDER — NALTREXONE HCL 50 MG PO TABS: 50.0000 mg | ORAL_TABLET | Freq: Every day | ORAL | 0 refills | Status: AC

## 2024-04-22 MED ORDER — NICOTINE POLACRILEX 4 MG MT GUM: 4.0000 mg | CHEWING_GUM | OROMUCOSAL | 0 refills | Status: AC | PRN

## 2024-04-22 MED ORDER — VITAMIN B-1 100 MG PO TABS: 100.0000 mg | ORAL_TABLET | Freq: Every day | ORAL | 0 refills | Status: AC

## 2024-04-22 MED ORDER — ADULT MULTIVITAMIN W/MINERALS CH: 1.0000 | ORAL_TABLET | Freq: Every day | ORAL | 0 refills | Status: AC

## 2024-04-22 NOTE — Discharge Planning (Signed)
 SW met with patient who stated that he has decided against going to residential care and needed to work on getting his job back and was planning to discharge to his friends home today. He stated that he is still planning to go to [facility] residential care possibly in a week or two and has the information to contact them. Provider made aware and cab voucher was given to patient as well as shelter resources.

## 2024-04-22 NOTE — ED Notes (Signed)
 Patient discharged home per MD order. After Visit Summary (AVS) printed and given to patient, as well as printed prescriptions. AVS reviewed with patient and all questions fully answered. Patient discharged in no acute distress, A& O x4 and ambulatory. Patient denied SI/HI, A/VH upon discharge. Patient verbalized understanding of all discharge instructions explained by staff, including follow up appointments, RX's and safety plan. Patient mood fair. Patient belongings returned to patient from locker #16 complete and intact. Patient escorted to lobby via staff for transport to destination. Safety maintained.

## 2024-04-22 NOTE — ED Notes (Signed)
 Patient alert & oriented x4. Denies intent to harm self or others when asked. Denies A/VH. Patient denies any physical complaints when asked. No acute distress noted. Scheduled medications administered with no complications. Patient set to discharge later this AM, no complaints at this time regarding this plan. Support and encouragement provided. Routine safety checks conducted per facility protocol. Encouraged patient to notify staff if any thoughts of harm towards self or others arise. Patient verbalizes understanding and agreement.

## 2024-04-22 NOTE — Group Note (Signed)
 Group Topic: Relapse and Recovery  Group Date: 04/22/2024 Start Time: 1045 End Time: 1055 Facilitators: Wayne Brunker, [place]us [place]oll, RN  Department: [facility]  Number of Participants: 1  Group Focus: discharge education Treatment Modality:  Individual Therapy Interventions utilized were patient education Purpose: increase insight  Name: [name] Date of Birth: 1981/02/12  MR: [id number]    Level of Participation: active Quality of Participation: attentive, cooperative, engaged, and offered feedback Interactions with others: gave feedback Mood/Affect: appropriate Triggers (if applicable): None identified Cognition: coherent/clear, goal directed, and logical Progress: Significant Response: Patient voiced understanding of all discharge instructions as provided to them. AVS, RXs, and suicide safety plan reviewed. All questions answered. Contact information for further questions given. No further questions at this time. Plan: patient will be encouraged to reach out if needed, refer to suicide safety plan as needed, and contact emergency response if needed.  Patients Problems:  Patient Active Problem List   Diagnosis Date Noted   Alcohol use disorder, severe, dependence (HCC) 04/18/2024   History of suicide attempt 04/18/2024   History of admission to inpatient psychiatry department 04/18/2024   Substance induced mood disorder (HCC) 04/18/2024   Tobacco use disorder 04/18/2024   Cocaine use disorder in remission 04/18/2024   Alcohol use disorder 04/17/2024   Noninfectious gastroenteritis 10/21/2023   Adjustment disorder with mixed anxiety and depressed mood 04/06/2016

## 2024-04-22 NOTE — ED Provider Notes (Signed)
 FBC/OBS ASAP Discharge Summary  Date and Time: 04/22/2024 1:15 PM  Name: [name]  MRN:  [id number]   Discharge Diagnoses:  Final diagnoses:  Alcohol use disorder, severe, dependence (HCC)  History of suicide attempt  History of admission to inpatient psychiatry department  Tobacco use disorder  Cocaine use disorder in remission   [name] is a 43 y.o. male, with AUD, nicotine use d/o, SIMD, MDD in remission, remote suicide attempt and inpatient psych admissions, who admitted to [facility] (04/17/2024) from [facility] the same day for etoh detox for daily drinking since 08/2023 after job loss and homelessness Last drink was 04/16/2024 First time detox  Subjective: Chart reviewed, case discussed with RN and Child psychotherapist.  Patient seen during rounds.  Patient reports good mood today.  He is excited to go home.  Patient scored 0 on PHQ-9.  He denies thoughts of harming himself or others.  He denies any psychotic  Stay Summary: Patient was admitted to long-term at the [facility].  Patient was detoxed from alcohol using Librium taper.  He was started on gabapentin to help with anxiety.  Patient was also started on naltrexone to help with alcohol cravings.  Patient tolerated medicine fine without side effects.  Patient was encouraged to consider rehab options.  Patient refused to go to the residential rehab facility.  He agreed to follow-up with intensive outpatient program.  While on the unit patient attended groups.  He reported to groups that have been with coping strategies.  Patient was encouraged to maintain sobriety.   Past Psychiatric History: See H&P Past Medical History: See H&P Family History: See H&P Family Psychiatric  History: See H&P Social History: See H&P  Tobacco Cessation:  A prescription for an FDA-approved tobacco cessation medication provided at discharge  Current Medications:  Current Facility-Administered Medications  Medication Dose Route Frequency Provider Last Rate Last  Admin   acetaminophen  (TYLENOL ) tablet 650 mg  650 mg Oral Q6H PRN Bobbitt, Shalon E, NP       alum & mag hydroxide-simeth (MAALOX/MYLANTA) 200-200-20 MG/5ML suspension 30 mL  30 mL Oral Q4H PRN Bobbitt, Shalon E, NP       haloperidol (HALDOL) tablet 5 mg  5 mg Oral TID PRN Bobbitt, Shalon E, NP       And   diphenhydrAMINE  (BENADRYL ) capsule 50 mg  50 mg Oral TID PRN Bobbitt, Shalon E, NP       haloperidol lactate (HALDOL) injection 5 mg  5 mg Intramuscular TID PRN Bobbitt, Shalon E, NP       And   diphenhydrAMINE  (BENADRYL ) injection 50 mg  50 mg Intramuscular TID PRN Bobbitt, Shalon E, NP       And   LORazepam  (ATIVAN ) injection 2 mg  2 mg Intramuscular TID PRN Bobbitt, Shalon E, NP       haloperidol lactate (HALDOL) injection 10 mg  10 mg Intramuscular TID PRN Bobbitt, Shalon E, NP       And   diphenhydrAMINE  (BENADRYL ) injection 50 mg  50 mg Intramuscular TID PRN Bobbitt, Shalon E, NP       And   LORazepam  (ATIVAN ) injection 2 mg  2 mg Intramuscular TID PRN Bobbitt, Shalon E, NP       gabapentin (NEURONTIN) capsule 300 mg  300 mg Oral BID PRN Nguyen, Julie, DO       magnesium  hydroxide (MILK OF MAGNESIA) suspension 30 mL  30 mL Oral Daily PRN Bobbitt, Shalon E, NP  multivitamin with minerals tablet 1 tablet  1 tablet Oral Daily Bobbitt, Shalon E, NP   1 tablet at 04/22/24 0940   naltrexone (DEPADE) tablet 50 mg  50 mg Oral QHS Nguyen, Julie, DO   50 mg at 04/21/24 2203   nicotine (NICODERM CQ - dosed in mg/24 hours) patch 21 mg  21 mg Transdermal Q0600 Nguyen, Julie, DO   21 mg at 04/19/24 0625   nicotine polacrilex (NICORETTE) gum 4 mg  4 mg Oral PRN Nguyen, Julie, DO   4 mg at 04/22/24 0851   polyethylene glycol (MIRALAX  / GLYCOLAX ) packet 17 g  17 g Oral Daily PRN Nguyen, Julie, DO       senna (SENOKOT) tablet 8.6 mg  1 tablet Oral Daily PRN Nguyen, Julie, DO       thiamine  (VITAMIN B1) tablet 100 mg  100 mg Oral QHS Nguyen, Julie, DO   100 mg at 04/21/24 2203   traZODone   (DESYREL ) tablet 50 mg  50 mg Oral QHS PRN Bobbitt, Shalon E, NP       Current Outpatient Medications  Medication Sig Dispense Refill   gabapentin (NEURONTIN) 300 MG capsule Take 1 capsule (300 mg total) by mouth 2 (two) times daily. 60 capsule 0   Multiple Vitamin (MULTIVITAMIN WITH MINERALS) TABS tablet Take 1 tablet by mouth daily. 30 tablet 0   naltrexone (DEPADE) 50 MG tablet Take 1 tablet (50 mg total) by mouth at bedtime. 30 tablet 0   nicotine polacrilex (NICORETTE) 4 MG gum Take 1 each (4 mg total) by mouth as needed for smoking cessation. 100 tablet 0   thiamine  (VITAMIN B-1) 100 MG tablet Take 1 tablet (100 mg total) by mouth at bedtime. 30 tablet 0   traZODone  (DESYREL ) 50 MG tablet Take 1 tablet (50 mg total) by mouth at bedtime as needed for sleep. 30 tablet 0    PTA Medications:  PTA Medications  Medication Sig   nicotine polacrilex (NICORETTE) 4 MG gum Take 1 each (4 mg total) by mouth as needed for smoking cessation.   traZODone  (DESYREL ) 50 MG tablet Take 1 tablet (50 mg total) by mouth at bedtime as needed for sleep.   naltrexone (DEPADE) 50 MG tablet Take 1 tablet (50 mg total) by mouth at bedtime.   gabapentin (NEURONTIN) 300 MG capsule Take 1 capsule (300 mg total) by mouth 2 (two) times daily.   Multiple Vitamin (MULTIVITAMIN WITH MINERALS) TABS tablet Take 1 tablet by mouth daily.   thiamine  (VITAMIN B-1) 100 MG tablet Take 1 tablet (100 mg total) by mouth at bedtime.   Facility Ordered Medications  Medication   acetaminophen  (TYLENOL ) tablet 650 mg   alum & mag hydroxide-simeth (MAALOX/MYLANTA) 200-200-20 MG/5ML suspension 30 mL   magnesium  hydroxide (MILK OF MAGNESIA) suspension 30 mL   haloperidol (HALDOL) tablet 5 mg   And   diphenhydrAMINE  (BENADRYL ) capsule 50 mg   haloperidol lactate (HALDOL) injection 5 mg   And   diphenhydrAMINE  (BENADRYL ) injection 50 mg   And   LORazepam  (ATIVAN ) injection 2 mg   haloperidol lactate (HALDOL) injection 10 mg   And    diphenhydrAMINE  (BENADRYL ) injection 50 mg   And   LORazepam  (ATIVAN ) injection 2 mg   traZODone  (DESYREL ) tablet 50 mg   [COMPLETED] thiamine  (VITAMIN B1) injection 100 mg   multivitamin with minerals tablet 1 tablet   [EXPIRED] LORazepam  (ATIVAN ) tablet 1 mg   [EXPIRED] hydrOXYzine  (ATARAX ) tablet 25 mg   [EXPIRED] loperamide  (IMODIUM ) capsule  2-4 mg   [EXPIRED] ondansetron  (ZOFRAN -ODT) disintegrating tablet 4 mg   nicotine (NICODERM CQ - dosed in mg/24 hours) patch 21 mg   nicotine polacrilex (NICORETTE) gum 4 mg   [COMPLETED] naltrexone (DEPADE) tablet 25 mg   Followed by   naltrexone (DEPADE) tablet 50 mg   thiamine  (VITAMIN B1) tablet 100 mg   [COMPLETED] chlordiazePOXIDE (LIBRIUM) capsule 25 mg   gabapentin (NEURONTIN) capsule 300 mg   senna (SENOKOT) tablet 8.6 mg   And   [COMPLETED] senna (SENOKOT) tablet 8.6 mg   [EXPIRED] polyethylene glycol (MIRALAX  / GLYCOLAX ) packet 17 g   And   polyethylene glycol (MIRALAX  / GLYCOLAX ) packet 17 g       04/22/2024    1:15 PM 04/20/2024    2:42 PM 04/17/2024   11:16 PM  Depression screen PHQ 2/9  Decreased Interest 0 0 1  Down, Depressed, Hopeless 0 2 1  PHQ - 2 Score 0 2 2  Altered sleeping 0 0 0  Tired, decreased energy 0 0 0  Change in appetite 0 0 0  Feeling bad or failure about yourself  0 2 0  Trouble concentrating 0 0 0  Moving slowly or fidgety/restless 0 0 0  Suicidal thoughts 0 0 0  PHQ-9 Score 0 4 2  Difficult doing work/chores Not difficult at all Not difficult at all Somewhat difficult    Flowsheet Row ED from 04/17/2024 in [facility] Most recent reading at 04/18/2024 12:21 AM ED from 04/17/2024 in [facility] Most recent reading at 04/17/2024  4:23 PM ED from 01/03/2024 in [facility] Emergency Department at [facility] Most recent reading at 01/03/2024  2:00 PM  C-SSRS RISK CATEGORY No Risk No Risk No Risk       Musculoskeletal  Strength & Muscle  Tone: within normal limits Gait & Station: normal Patient leans: N/A  Psychiatric Specialty Exam  Presentation General Appearance:Appropriate for Environment, Fairly Groomed (pleasant, engaged) Eye Contact:Good Speech:Clear and Coherent, Normal Rate (spontaneous) Volume:Normal Handedness:Right   Mood and Affect  Mood: "Good" Affect:Appropriate, Congruent,  Thought Process  Thought Process:Coherent, Goal Directed, Linear Descriptions of Associations:Intact   Thought Content Suicidal Thoughts: Denies Homicidal Thoughts: Denied Hallucinations:None Ideas of Reference:None Thought Content:WDL, Logical   Sensorium  Memory:Immediate Good, Recent Good Judgment:Good Insight:Good   Art therapist  Orientation:Full (Time, Place and Person) Language:Good Concentration:Good Attention:Good Recall:Good Fund of Knowledge:Good   Psychomotor Activity  Psychomotor Activity:Psychomotor Activity: Normal   Assets  Assets:Communication Skills, Desire for Improvement, Leisure Time, Physical Health, Resilience   Sleep  Fair   Physical Exam  Physical Exam Vitals and nursing note reviewed.  Constitutional:      General: He is not in acute distress.    Appearance: Normal appearance. He is not ill-appearing, toxic-appearing or diaphoretic.  HENT:     Head: Normocephalic and atraumatic.     Nose: No congestion.  Eyes:     Pupils: Pupils are equal, round, and reactive to light.     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 No respiratory distress.  Musculoskeletal:        General: No deformity or signs of injury. Normal range of motion.     Right lower leg: No edema.     Left lower leg: No edema.  Neurological:     General: No focal deficit present.     Mental Status: He is alert and  oriented to person, place, and time   Review of Systems  Constitutional:  Negative for chills and fever.  HENT:  Negative for  hearing loss and sore throat.   Eyes:  Negative for blurred vision and double vision.  Respiratory:  Negative for cough.   Cardiovascular:  Negative for chest pain.  Neurological:  Negative for dizziness and speech change.   Blood pressure (!) 124/90, pulse (!) 58, temperature 97.7 F (36.5 C), temperature source Oral, resp. rate 17, SpO2 99%. There is no height or weight on file to calculate BMI.  Demographic Factors:  Male  Loss Factors: Financial problems/change in socioeconomic status  Historical Factors: Impulsivity  Risk Reduction Factors:   Sense of responsibility to family and Positive social support  Continued Clinical Symptoms:  Alcohol/Substance Abuse/Dependencies Previous Psychiatric Diagnoses and Treatments  Cognitive Features That Contribute To Risk:  Thought constriction (tunnel vision)    Suicide Risk:  Minimal: No identifiable suicidal ideation.    Plan Of Care/Follow-up recommendations:  Activity:  As tolerated Diet:  Cardiac healthy Other:  Patient was encouraged to abstain from alcohol and illicit drug use.  He was encouraged to work on sobriety and coping strategies  Disposition: Home  Silas Drivers, MD 04/22/2024, 1:15 PM

## 2024-04-22 NOTE — ED Notes (Signed)
 Patient is sleeping. Respirations equal and unlabored, skin warm and dry. No change in assessment or acuity. Routine safety checks conducted according to facility protocol. Will continue to monitor for safety.

## 2024-04-22 NOTE — Discharge Instructions (Signed)
 Substance Use Resources for follow up:   [employer] [address] [place] [address] [phone]  [facility] [employer] [phone]  [employer].oxford[employer]ancies.com  [facility] of [facility] [employer]  [employer] [facility] [employer] [address] [place], [place]  [address] [phone]  [facility] - [facility] [address], [place], [place] [address] [phone]  [facility] [employer] [facility] [address] [place], [place]  [address] [phone]  [facility] [address] [place], [place] [address] [phone]

## 2024-05-06 ENCOUNTER — Emergency Department (HOSPITAL_COMMUNITY)
Admission: EM | Admit: 2024-05-06 | Discharge: 2024-05-07 | Disposition: A | Discharge status: Home / Self Care | Attending: Emergency Medicine | Admitting: Emergency Medicine

## 2024-05-06 ENCOUNTER — Encounter (HOSPITAL_COMMUNITY): Payer: Self-pay

## 2024-05-06 DIAGNOSIS — J111 Influenza due to unidentified influenza virus with other respiratory manifestations: Secondary | ICD-10-CM | POA: Insufficient documentation

## 2024-05-06 DIAGNOSIS — E871 Hypo-osmolality and hyponatremia: Secondary | ICD-10-CM | POA: Insufficient documentation

## 2024-05-06 DIAGNOSIS — Z5900 Homelessness unspecified: Secondary | ICD-10-CM | POA: Diagnosis not present

## 2024-05-06 DIAGNOSIS — J45909 Unspecified asthma, uncomplicated: Secondary | ICD-10-CM | POA: Diagnosis not present

## 2024-05-06 DIAGNOSIS — R519 Headache, unspecified: Secondary | ICD-10-CM | POA: Diagnosis not present

## 2024-05-06 DIAGNOSIS — R509 Fever, unspecified: Secondary | ICD-10-CM | POA: Diagnosis present

## 2024-05-06 LAB — COMPREHENSIVE METABOLIC PANEL WITH GFR
ALT: 23 U/L (ref 0–44)
AST: 15 U/L (ref 15–41)
Albumin: 3.9 g/dL (ref 3.5–5.0)
Alkaline Phosphatase: 87 U/L (ref 38–126)
Anion gap: 10 (ref 5–15)
BUN: 12 mg/dL (ref 6–20)
CO2: 23 mmol/L (ref 22–32)
Calcium: 8.8 mg/dL — ABNORMAL LOW (ref 8.9–10.3)
Chloride: 100 mmol/L (ref 98–111)
Creatinine, Ser: 0.84 mg/dL (ref 0.61–1.24)
GFR, Estimated: >60 mL/min (ref >60)
Glucose, Bld: 101 mg/dL — ABNORMAL HIGH (ref 70–99)
Potassium: 3.6 mmol/L (ref 3.5–5.1)
Sodium: 133 mmol/L — ABNORMAL LOW (ref 135–145)
Total Bilirubin: 1.1 mg/dL (ref 0.0–1.2)
Total Protein: 6.5 g/dL (ref 6.5–8.1)

## 2024-05-06 LAB — CBC WITH DIFFERENTIAL/PLATELET
Abs Immature Granulocytes: 0.05 10*3/uL (ref 0.00–0.07)
Basophils Absolute: 0 10*3/uL (ref 0.0–0.1)
Basophils Relative: 0 %
Eosinophils Absolute: 0 10*3/uL (ref 0.0–0.5)
Eosinophils Relative: 0 %
HCT: 44.9 % (ref 39.0–52.0)
Hemoglobin: 15.1 g/dL (ref 13.0–17.0)
Immature Granulocytes: 1 %
Lymphocytes Relative: 14 %
Lymphs Abs: 1.3 10*3/uL (ref 0.7–4.0)
MCH: 28.7 pg (ref 26.0–34.0)
MCHC: 33.6 g/dL (ref 30.0–36.0)
MCV: 85.4 fL (ref 80.0–100.0)
Monocytes Absolute: 1.1 10*3/uL — ABNORMAL HIGH (ref 0.1–1.0)
Monocytes Relative: 12 %
Neutro Abs: 6.7 10*3/uL (ref 1.7–7.7)
Neutrophils Relative %: 73 %
Platelets: 268 10*3/uL (ref 150–400)
RBC: 5.26 MIL/uL (ref 4.22–5.81)
RDW: 12.8 % (ref 11.5–15.5)
WBC: 9.3 10*3/uL (ref 4.0–10.5)
nRBC: 0 % (ref 0.0–0.2)

## 2024-05-06 LAB — RESP PANEL BY RT-PCR (RSV, FLU A&B, COVID)  RVPGX2
Influenza A by PCR: NEGATIVE
Influenza B by PCR: NEGATIVE
Resp Syncytial Virus by PCR: NEGATIVE
SARS Coronavirus 2 by RT PCR: NEGATIVE

## 2024-05-06 NOTE — ED Triage Notes (Signed)
 Pt c/o generalized body aches, chills, and headache x1 day.  Pt reports he thinks he is dehydrated, but he has been drinking a lot of water .

## 2024-05-06 NOTE — ED Provider Triage Note (Signed)
 Emergency Medicine Provider Triage Evaluation Note  [name] , a 43 y.o. male  was evaluated in triage.  Pt complains of feeling dehydrated, currently living in the street?  Reports that he feels some chills, feeling all rundown.  Subjective fever.  Has tried drinking a lot of water  but no improvement in symptoms.  Reports no alcohol intake in several months..  Review of Systems  Positive: Chills, bodyaches Negative: fever  Physical Exam  BP (!) 154/95 (BP Location: Right Arm)   Pulse 96   Temp 98.5 F (36.9 C)   Resp 20   Ht 5\' 10"  (1.778 m)   Wt 85.7 kg   SpO2 99%   BMI 27.12 kg/m  Gen:   Awake, no distress   Resp:  Normal effort MSK:   Moves extremities without difficulty  Other:    Medical Decision Making  Medically screening exam initiated at 5:24 PM.  Appropriate orders placed.  [name] was informed that the remainder of the evaluation will be completed by another provider, this initial triage assessment does not replace that evaluation, and the importance of remaining in the ED until their evaluation is complete.     Soha Thorup, PA-C 05/06/24 1727

## 2024-05-07 MED ORDER — ONDANSETRON HCL 4 MG/2ML IJ SOLN
4.0000 mg | Freq: Once | INTRAMUSCULAR | Status: AC
  Administered 2024-05-07: 4 mg via INTRAVENOUS
  Filled 2024-05-07: qty 2

## 2024-05-07 MED ORDER — SODIUM CHLORIDE 0.9 % IV BOLUS
1000.0000 mL | Freq: Once | INTRAVENOUS | Status: AC
  Administered 2024-05-07: 1000 mL via INTRAVENOUS

## 2024-05-07 MED ORDER — KETOROLAC TROMETHAMINE 30 MG/ML IJ SOLN
30.0000 mg | Freq: Once | INTRAMUSCULAR | Status: AC
  Administered 2024-05-07: 30 mg via INTRAVENOUS
  Filled 2024-05-07: qty 1

## 2024-05-07 NOTE — ED Provider Notes (Signed)
 [place] EMERGENCY DEPARTMENT AT [facility] Provider Note   CSN: [id number] Arrival date & time: 05/06/24  1649     History  Chief Complaint  Patient presents with   Generalized Body Aches   Chills   Headache    [name] is a 43 y.o. male.  The history is provided by the patient.  Headache He has history of asthma, attention deficit disorder, substance abuse, homelessness and comes in complaining of subjective fever and bodyaches which started yesterday.  He states that he feels like he is dehydrated.  There has been a subjective fever as well as chills and sweats.  There has been a slight sore throat, no cough.  He is complaining of nausea but no vomiting or diarrhea.  He denies any sick contacts.  He has not taken any medication.   Home Medications Prior to Admission medications   Medication Sig Start Date End Date Taking? Authorizing Provider  gabapentin  (NEURONTIN ) 300 MG capsule Take 1 capsule (300 mg total) by mouth 2 (two) times daily. 04/22/24   Silas Drivers, MD  Multiple Vitamin (MULTIVITAMIN WITH MINERALS) TABS tablet Take 1 tablet by mouth daily. 04/22/24   Silas Drivers, MD  naltrexone  (DEPADE) 50 MG tablet Take 1 tablet (50 mg total) by mouth at bedtime. 04/22/24   Silas Drivers, MD  nicotine  polacrilex (NICORETTE ) 4 MG gum Take 1 each (4 mg total) by mouth as needed for smoking cessation. 04/22/24   Silas Drivers, MD  thiamine  (VITAMIN B-1) 100 MG tablet Take 1 tablet (100 mg total) by mouth at bedtime. 04/22/24   Silas Drivers, MD  traZODone  (DESYREL ) 50 MG tablet Take 1 tablet (50 mg total) by mouth at bedtime as needed for sleep. 04/22/24   Silas Drivers, MD      Allergies    Bee venom    Review of Systems   Review of Systems  Neurological:  Positive for headaches.  All other systems reviewed and are negative.   Physical Exam Updated Vital Signs BP 130/74   Pulse 90   Temp 99.8 F (37.7 C) (Oral)   Resp 18   Ht 5\' 10"   (1.778 m)   Wt 85.7 kg   SpO2 100%   BMI 27.12 kg/m  Physical Exam Vitals and nursing note reviewed.   43 year old male, resting comfortably and in no acute distress. Vital signs are significant for borderline elevated temperature, elevated blood pressure. Oxygen saturation is 97%, which is normal. Head is normocephalic and atraumatic. PERRLA, EOMI. Oropharynx is clear. Neck is nontender and supple without adenopathy . Lungs are clear without rales, wheezes, or rhonchi. Chest is nontender. Heart has regular rate and rhythm without murmur. Abdomen is soft, flat, nontender without. Skin is warm and dry without rash. Neurologic: Mental status is normal, moves all extremities equally.  ED Results / Procedures / Treatments   Labs (all labs ordered are listed, but only abnormal results are displayed) Labs Reviewed  CBC WITH DIFFERENTIAL/PLATELET - Abnormal; Notable for the following components:      Result Value   Monocytes Absolute 1.1 (*)    All other components within normal limits  COMPREHENSIVE METABOLIC PANEL WITH GFR - Abnormal; Notable for the following components:   Sodium 133 (*)    Glucose, Bld 101 (*)    Calcium 8.8 (*)    All other components within normal limits  RESP PANEL BY RT-PCR (RSV, FLU A&B, COVID)  RVPGX2   Procedures Procedures  Medications Ordered in ED Medications  sodium chloride  0.9 % bolus 1,000 mL (1,000 mLs Intravenous New Bag/Given 05/07/24 0044)  ondansetron  (ZOFRAN ) injection 4 mg (4 mg Intravenous Given 05/07/24 0045)  ketorolac  (TORADOL ) 30 MG/ML injection 30 mg (30 mg Intravenous Given 05/07/24 0045)    ED Course/ Medical Decision Making/ A&P                                 Medical Decision Making Risk Prescription drug management.   Fever and chills and bodyaches strongly suggestive of viral illness such as influenza, COVID-19, RSV, other viral illnesses.  Patient does not appear toxic, doubt sepsis.  I have reviewed his laboratory  tests, and my interpretation is borderline hyponatremia not felt to be clinically significant, borderline elevated random glucose level, normal CBC, negative PCR test for influenza and COVID-19 and RSV.  Repeat temperature is slightly elevated.  I have ordered IV fluids, ketorolac , ondansetron .  He feels much better following above-noted treatment.  I am discharging him with instructions to drink plenty of fluids and use over-the-counter NSAIDs and acetaminophen  as needed.  I have also given him a resource guide for shelters that are available for homeless people.  Final Clinical Impression(s) / ED Diagnoses Final diagnoses:  Influenza-like illness  Homeless    Rx / DC Orders ED Discharge Orders     None         Alissa April, MD 05/07/24 0200

## 2024-05-07 NOTE — Discharge Instructions (Addendum)
 Drink plenty of fluids.  Acetaminophen  and/or ibuprofen  as needed for fever or aching.  Return to the emergency department if symptoms are getting worse.

## 2024-07-11 ENCOUNTER — Other Ambulatory Visit: Payer: Self-pay

## 2024-07-11 ENCOUNTER — Emergency Department (HOSPITAL_COMMUNITY)
Admission: EM | Admit: 2024-07-11 | Discharge: 2024-07-11 | Discharge status: Against Medical Advice | Attending: Emergency Medicine | Admitting: Emergency Medicine

## 2024-07-11 ENCOUNTER — Encounter (HOSPITAL_COMMUNITY): Payer: Self-pay | Admitting: Emergency Medicine

## 2024-07-11 ENCOUNTER — Emergency Department (HOSPITAL_COMMUNITY)

## 2024-07-11 DIAGNOSIS — Z5321 Procedure and treatment not carried out due to patient leaving prior to being seen by health care provider: Secondary | ICD-10-CM | POA: Diagnosis not present

## 2024-07-11 DIAGNOSIS — R12 Heartburn: Secondary | ICD-10-CM | POA: Insufficient documentation

## 2024-07-11 DIAGNOSIS — H5789 Other specified disorders of eye and adnexa: Secondary | ICD-10-CM | POA: Diagnosis not present

## 2024-07-11 LAB — BASIC METABOLIC PANEL WITH GFR
Anion gap: 9 (ref 5–15)
BUN: 18 mg/dL (ref 6–20)
CO2: 22 mmol/L (ref 22–32)
Calcium: 8.7 mg/dL — ABNORMAL LOW (ref 8.9–10.3)
Chloride: 109 mmol/L (ref 98–111)
Creatinine, Ser: 0.98 mg/dL (ref 0.61–1.24)
GFR, Estimated: >60 mL/min (ref >60)
Glucose, Bld: 106 mg/dL — ABNORMAL HIGH (ref 70–99)
Potassium: 3.6 mmol/L (ref 3.5–5.1)
Sodium: 140 mmol/L (ref 135–145)

## 2024-07-11 LAB — CBC
HCT: 43.5 % (ref 39.0–52.0)
Hemoglobin: 13.9 g/dL (ref 13.0–17.0)
MCH: 27.7 pg (ref 26.0–34.0)
MCHC: 32 g/dL (ref 30.0–36.0)
MCV: 86.7 fL (ref 80.0–100.0)
Platelets: 249 K/uL (ref 150–400)
RBC: 5.02 MIL/uL (ref 4.22–5.81)
RDW: 13.2 % (ref 11.5–15.5)
WBC: 6 K/uL (ref 4.0–10.5)
nRBC: 0 % (ref 0.0–0.2)

## 2024-07-11 LAB — TROPONIN I (HIGH SENSITIVITY): Troponin I (High Sensitivity): 8 ng/L (ref <18)

## 2024-07-11 NOTE — ED Triage Notes (Signed)
  Patient BIB EMS for burning in chest, throat and L eye that started this morning after waking up.  Patient states he ate some pork fried rice last night and states this happened last time he had food poisoning.  Denies any N/V.  States its not GERD.  Was able to tolerate fluids after waking up without it hurting.  States pain is 5/10, burning.  No OTC meds taken.  Denies any other issues at this time.

## 2024-07-11 NOTE — ED Notes (Signed)
PT LEFT AMA 

## 2024-08-14 ENCOUNTER — Telehealth: Payer: Self-pay

## 2024-08-14 DIAGNOSIS — K529 Noninfective gastroenteritis and colitis, unspecified: Secondary | ICD-10-CM

## 2024-08-14 NOTE — Progress Notes (Signed)
 Complex Care Management Note Care Guide Note  08/14/2024 Name: [name] MRN: [id number] DOB: 1981/05/10   Complex Care Management Outreach Attempts: An unsuccessful telephone outreach was attempted today to offer the patient information about available complex care management services.  Follow Up Plan:  Additional outreach attempts will be made to offer the patient complex care management information and services.   Encounter Outcome:  No Answer  Dreama [facility]  [facility], [facility] VBCI Assistant Direct Dial: [phone]  Fax: [phone]

## 2024-08-18 NOTE — Progress Notes (Signed)
 Complex Care Management Note Care Guide Note  08/18/2024 Name: [name] MRN: [id number] DOB: 1981-05-25   Complex Care Management Outreach Attempts: A second unsuccessful outreach was attempted today to offer the patient with information about available complex care management services.  Follow Up Plan:  Additional outreach attempts will be made to offer the patient complex care management information and services.   Encounter Outcome:  No Answer  Dreama [facility]  [facility], [facility] VBCI Assistant Direct Dial: [phone]  Fax: [phone]

## 2024-08-25 NOTE — Progress Notes (Signed)
 Complex Care Management Note Care Guide Note  08/25/2024 Name: [name] MRN: [id number] DOB: 1981-09-22   Complex Care Management Outreach Attempts: A third unsuccessful outreach was attempted today to offer the patient with information about available complex care management services.  Follow Up Plan:  No further outreach attempts will be made at this time. We have been unable to contact the patient to offer or enroll patient in complex care management services.  Encounter Outcome:  No Answer  Dreama [facility]  [facility], [facility] VBCI Assistant Direct Dial: [phone]  Fax: [phone]
